# Patient Record
Sex: Male | Born: 1955 | Race: White | Hispanic: No | Marital: Married | Smoking: Never smoker
Health system: Southern US, Community
[De-identification: ages and names within clinical notes are randomized; demographics above are authoritative.]

## PROBLEM LIST (undated history)

## (undated) DIAGNOSIS — E669 Obesity, unspecified: Secondary | ICD-10-CM

## (undated) DIAGNOSIS — L03039 Cellulitis of unspecified toe: Secondary | ICD-10-CM

## (undated) DIAGNOSIS — M1990 Unspecified osteoarthritis, unspecified site: Secondary | ICD-10-CM

## (undated) DIAGNOSIS — D126 Benign neoplasm of colon, unspecified: Secondary | ICD-10-CM

## (undated) DIAGNOSIS — G4733 Obstructive sleep apnea (adult) (pediatric): Secondary | ICD-10-CM

## (undated) DIAGNOSIS — Z96652 Presence of left artificial knee joint: Secondary | ICD-10-CM

## (undated) DIAGNOSIS — E785 Hyperlipidemia, unspecified: Secondary | ICD-10-CM

## (undated) DIAGNOSIS — I10 Essential (primary) hypertension: Secondary | ICD-10-CM

## (undated) DIAGNOSIS — M2140 Flat foot [pes planus] (acquired), unspecified foot: Secondary | ICD-10-CM

## (undated) HISTORY — PX: OTHER SURGICAL HISTORY: SHX169

## (undated) HISTORY — DX: Obstructive sleep apnea (adult) (pediatric): G47.33

## (undated) HISTORY — DX: Flat foot (pes planus) (acquired), unspecified foot: M21.40

## (undated) HISTORY — DX: Unspecified osteoarthritis, unspecified site: M19.90

## (undated) HISTORY — DX: Hyperlipidemia, unspecified: E78.5

## (undated) HISTORY — DX: Obesity, unspecified: E66.9

## (undated) HISTORY — DX: Cellulitis of unspecified toe: L03.039

## (undated) HISTORY — DX: Benign neoplasm of colon, unspecified: D12.6

## (undated) HISTORY — DX: Essential (primary) hypertension: I10

---

## 2000-04-26 DIAGNOSIS — G4733 Obstructive sleep apnea (adult) (pediatric): Secondary | ICD-10-CM

## 2000-04-26 HISTORY — DX: Obstructive sleep apnea (adult) (pediatric): G47.33

## 2005-06-03 ENCOUNTER — Ambulatory Visit: Payer: Self-pay | Admitting: Podiatry

## 2007-07-18 ENCOUNTER — Encounter: Admission: RE | Admit: 2007-07-18 | Discharge: 2007-07-18 | Payer: Self-pay | Admitting: Orthopedic Surgery

## 2008-07-17 ENCOUNTER — Ambulatory Visit: Payer: Self-pay | Admitting: Unknown Physician Specialty

## 2011-05-17 ENCOUNTER — Ambulatory Visit (INDEPENDENT_AMBULATORY_CARE_PROVIDER_SITE_OTHER): Payer: BC Managed Care – PPO | Admitting: Internal Medicine

## 2011-05-17 ENCOUNTER — Encounter: Payer: Self-pay | Admitting: Internal Medicine

## 2011-05-17 DIAGNOSIS — E669 Obesity, unspecified: Secondary | ICD-10-CM | POA: Insufficient documentation

## 2011-05-17 DIAGNOSIS — M2140 Flat foot [pes planus] (acquired), unspecified foot: Secondary | ICD-10-CM | POA: Insufficient documentation

## 2011-05-17 DIAGNOSIS — G4733 Obstructive sleep apnea (adult) (pediatric): Secondary | ICD-10-CM | POA: Insufficient documentation

## 2011-05-17 DIAGNOSIS — I10 Essential (primary) hypertension: Secondary | ICD-10-CM | POA: Insufficient documentation

## 2011-05-17 NOTE — Assessment & Plan Note (Signed)
He has not had a repeat sleep study since he lost 70 pounds but is not interested in doing so at this time. He has not noticed any significant leak but has had to tighten the mask on his face. He continues to get a good night sleep when he uses the mask.

## 2011-05-17 NOTE — Assessment & Plan Note (Signed)
His orthopedic/podiatric problems are a barrier to him receiving regular exercise as is his work schedule. Spent several minutes today discussing the need for regular aerobic exercise to augment incredible job he is done with weight loss and dietary modification.

## 2011-05-17 NOTE — Assessment & Plan Note (Signed)
He has been taking Maxzide for years for management of hypertension but does not check his blood pressures regularly at home. His blood pressure today using a large cuff was 130/88. 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 Records from Dr. Reva Bores office have been requested and he has no history of renal insufficiency or diabetes.

## 2011-05-17 NOTE — Patient Instructions (Signed)
Try adding 500 mg of tylenol maximum 4 tablets daily to your diclofenac for pain relief.

## 2011-05-17 NOTE — Assessment & Plan Note (Addendum)
He has lost 70 pounds through dietary modifications use of the Mediterranean diet. I recommended that he add 20-30 minutes of aerobic exercise 5 days a week as a goal. This will be difficult to achieve given his works 12 hour days 6 days a week along with his orthopedic issues

## 2011-05-17 NOTE — Progress Notes (Signed)
Subjective:    Patient ID: John Wolf, male    DOB: 1955/07/24, 56 y.o.   MRN: [id number]  HPI John Wolf is a 56 year old white male with a history of obstructive sleep apnea , hypertension and obesity who presents to establish care. He was referred by Hyman Bower and is a former patient of Francia Greaves. He has achieved a weight loss of 70 pounds over the last 4-5 years with dietary modifications and wears his CPAP for sleep apnea every night with good results. 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 He does not get regular exercise but works 12 hour days at his business and is often too tired to do anything else. He does have a treadmill at home. Past Medical History  Diagnosis Date  . Arthritis   . Sleep apnea, obstructive 2002    wears CPAP  . Obesity (BMI 30-39.9)     with 70 lb weight loss, ongoing  . Acquired pes planus   . Hypertension    No current outpatient prescriptions on file prior to visit.    Review of Systems  Constitutional: Negative for fever, chills, diaphoresis, activity change, appetite change, fatigue and unexpected weight change.  HENT: Negative for hearing loss, ear pain, nosebleeds, congestion, sore throat, facial swelling, rhinorrhea, sneezing, drooling, mouth sores, trouble swallowing, neck pain, neck stiffness, dental problem, voice change, postnasal drip, sinus pressure, tinnitus and ear discharge.   Eyes: Negative for photophobia, pain, discharge, redness, itching and visual disturbance.  Respiratory: Negative for apnea, cough, choking, chest tightness, shortness of breath, wheezing and stridor.   Cardiovascular: Negative for chest pain, palpitations and leg swelling.  Gastrointestinal: Negative for nausea, vomiting, abdominal pain, diarrhea, constipation, blood in stool, abdominal distention, anal bleeding and rectal  pain.  Genitourinary: Negative for dysuria, urgency, frequency, hematuria, flank pain, decreased urine volume, scrotal swelling, difficulty urinating and testicular pain.  Musculoskeletal: Negative for myalgias, back pain, joint swelling, arthralgias and gait problem.  Skin: Negative for color change, rash and wound.  Neurological: Negative for dizziness, tremors, seizures, syncope, speech difficulty, weakness, light-headedness, numbness and headaches.  Psychiatric/Behavioral: Negative for suicidal ideas, hallucinations, behavioral problems, confusion, sleep disturbance, dysphoric mood, decreased concentration and agitation. The patient is not nervous/anxious.   BP 132/88  Pulse 63  Temp(Src) 98.2 F (36.8 C) (Oral)  Wt 291 lb (131.997 kg)  SpO2 95%     Objective:   Physical Exam  Constitutional: He is oriented to person, place, and time.  HENT:  Head: Normocephalic and atraumatic.  Mouth/Throat: Oropharynx is clear and moist.  Eyes: Conjunctivae and EOM are normal.  Neck: Normal range of motion. Neck supple. No JVD present. No thyromegaly present.  Cardiovascular: Normal rate, regular rhythm and normal heart sounds.   Pulmonary/Chest: Effort normal and breath sounds normal. He has no wheezes. He has no rales.  Abdominal: Soft. Bowel sounds are normal. He exhibits no mass. There is no tenderness. There is no rebound.  Musculoskeletal: Normal range of motion. He exhibits no edema.  Neurological: He is alert and oriented to person, place, and time.  Skin: Skin is warm and dry.  Psychiatric: He has a normal mood and affect.       Assessment & Plan:   Obesity (BMI 30-39.9) He has lost 70 pounds through dietary modifications use of the Mediterranean diet. I recommended that he add 20-30 minutes  of aerobic exercise 5 days a week as a goal. This will be difficult to achieve given his works 12 hour days 6 days a week along with his orthopedic issues   Hypertension He has been taking  Maxzide for years for management of hypertension but does not check his blood pressures regularly at home. His blood pressure today using a large cuff was 130/88. 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 Records from Dr. Reva Bores office have been requested and he has no history of renal insufficiency or diabetes.  Acquired pes planus His orthopedic/podiatric problems are a barrier to him receiving regular exercise as is his work schedule. Spent several minutes today discussing the need for regular aerobic exercise to augment incredible job he is done with weight loss and dietary modification.  Sleep apnea, obstructive He has not had a repeat sleep study since he lost 70 pounds but is not interested in doing so at this time. He has not noticed any significant leak but has had to tighten the mask on his face. He continues to get a good night sleep when he uses the mask.    Updated Medication List Outpatient Encounter Prescriptions as of 05/17/2011  Medication Sig Dispense Refill  . Omega-3 Fatty Acids (FISH OIL PO) Take by mouth.      . triamterene-hydrochlorothiazide (MAXZIDE-25) 37.5-25 MG per tablet Take 1 tablet by mouth daily.      Marland Kitchen DISCONTD: fluconazole (DIFLUCAN) 50 MG tablet Take 50 mg by mouth daily.

## 2011-06-24 ENCOUNTER — Other Ambulatory Visit: Payer: Self-pay | Admitting: Internal Medicine

## 2011-06-24 NOTE — Telephone Encounter (Signed)
Opened in error

## 2011-06-25 ENCOUNTER — Other Ambulatory Visit: Payer: Self-pay | Admitting: Internal Medicine

## 2011-06-25 MED ORDER — TRIAMTERENE-HCTZ 37.5-25 MG PO TABS: 1.0000 | ORAL_TABLET | Freq: Every day | ORAL | Status: DC

## 2011-11-19 ENCOUNTER — Encounter: Payer: Self-pay | Admitting: *Deleted

## 2012-05-19 ENCOUNTER — Telehealth: Payer: Self-pay | Admitting: Internal Medicine

## 2012-05-19 MED ORDER — DICLOFENAC SODIUM 75 MG PO TBEC: 75.0000 mg | DELAYED_RELEASE_TABLET | Freq: Two times a day (BID) | ORAL | Status: DC

## 2012-05-19 NOTE — Telephone Encounter (Signed)
Ok to refill,  Refill sent  

## 2012-05-19 NOTE — Telephone Encounter (Signed)
Pt scheduled for an appt on 2/3. Ok to fill?

## 2012-05-19 NOTE — Telephone Encounter (Signed)
Diclofenac sodium 75 mg  # 60  Take one tablet twice a day

## 2012-05-29 ENCOUNTER — Encounter: Payer: Self-pay | Admitting: Internal Medicine

## 2012-05-29 ENCOUNTER — Ambulatory Visit (INDEPENDENT_AMBULATORY_CARE_PROVIDER_SITE_OTHER): Payer: BC Managed Care – PPO | Admitting: Internal Medicine

## 2012-05-29 VITALS — BP 132/82 | HR 71 | Temp 98.5°F | Resp 16 | Ht 69.0 in | Wt 281.0 lb

## 2012-05-29 DIAGNOSIS — I10 Essential (primary) hypertension: Secondary | ICD-10-CM

## 2012-05-29 DIAGNOSIS — G4733 Obstructive sleep apnea (adult) (pediatric): Secondary | ICD-10-CM

## 2012-05-29 DIAGNOSIS — E785 Hyperlipidemia, unspecified: Secondary | ICD-10-CM

## 2012-05-29 DIAGNOSIS — E669 Obesity, unspecified: Secondary | ICD-10-CM

## 2012-05-29 DIAGNOSIS — Z79899 Other long term (current) drug therapy: Secondary | ICD-10-CM

## 2012-05-29 NOTE — Assessment & Plan Note (Signed)
Well controlled on current regimen. Renal function pending, no changes today. 

## 2012-05-29 NOTE — Progress Notes (Signed)
Patient ID: John Wolf, male   DO: Sep 30, 1955, 57 y.o.   MRM: [id number]  Patient Active Problem List  Diagnosis  . Hypertension  . Obesity (BMI 30-39.9)  . Sleep apnea, obstructive  . Acquired pes planus    Subjective:  CC:   Chief Complaint  Patient presents with  . Follow-up    med refills    HPI:   John McPhersonis a 57 y.o. male who presents for one year follow up on hypertension. No complaints today.  Feels generally well except for poor sleep due to frequent wakening's once for nocturia, the others wakening's for unclear  Reasons.  Wearing his CPAP and taking his medications as directed. Some bilateral hand pain centered around the thumb.  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    Past Medical History  Diagnosis Date  . Arthritis   . Sleep apnea, obstructive 2002    wears CPAP  . Obesity (BMI 30-39.9)     with 70 lb weight loss, ongoing  . Acquired pes planus   . Hypertension   . Hyperlipidemia   . Benign neoplasm of colon   . Onychia and paronychia of toe     History reviewed. No pertinent past surgical history.  The following portions of the patient's history were reviewed and updated as appropriate: Allergies, current medications, and problem list.    Review of Systems:  Patient denies headache, fevers, malaise, unintentional weight loss, skin rash, eye pain, sinus congestion and sinus pain, sore throat, dysphagia,  hemoptysis , cough, dyspnea, wheezing, chest pain, palpitations, orthopnea, edema, abdominal pain, nausea, melena, diarrhea, constipation, flank pain, dysuria, hematuria, urinary  Frequency, nocturia, numbness, tingling, seizures,  Focal weakness, Loss of consciousness,  Tremor, insomnia, depression, anxiety, and suicidal ideation.     History   Social History  . Marital Status: Married    Spouse Name: N/A    Number of Children: N/A  . Years of Education: N/A   Occupational History  . Not on file.   Social  History Main Topics  . Smoking status: Never Smoker   . Smokeless tobacco: Never Used  . Alcohol Use: Yes  . Drug Use:   . Sexually Active:    Other Topics Concern  . Not on file   Social History Narrative  . No narrative on file    Objective:  BP 132/82  Pulse 71  Temp 98.5 F (36.9 C) (Oral)  Resp 16  Ht 5\' 9"  (1.753 m)  Wt 281 lb (127.461 kg)  BMI 41.50 kg/m2  SpO2 96%  General appearance: alert, cooperative and appears stated age Ears: normal TM's and external ear canals both ears Throat: lips, mucosa, and tongue normal; teeth and gums normal Neck: no adenopathy, no carotid bruit, supple, symmetrical, trachea midline and thyroid not enlarged, symmetric, no tenderness/mass/nodules Back: symmetric, no curvature. ROM normal. No CVA tenderness. Lungs: clear to auscultation bilaterally Heart: regular rate and rhythm, S1, S2 normal, no murmur, click, rub or gallop Abdomen: soft, non-tender; bowel sounds normal; no masses,  no organomegaly Pulses: 2+ and symmetric Skin: Skin color, texture, turgor normal. No rashes or lesions Lymph nodes: Cervical, supraclavicular, and axillary nodes normal. MSK:  Hand grip 5/5 ,  Interosseus muscles and thenar muscles normal  Assessment and Plan:  Hypertension Well controlled on current regimen. Renal function pending, no changes today.  Obesity (BMI 30-39.9) He has lost another 10 lbs. Encouragement provided  Sleep apnea, obstructive Diagnosed nearly 10 years ago  , with  regular use na no follow up study despite wt loss of 80 lbs and 10 yrs time.  Not sleeping well with frequently awakenings,  Refuses repeat titration study despite recommendations.    Updated Medication List Outpatient Encounter Prescriptions as of 05/29/2012  Medication Sig Dispense Refill  . diclofenac (VOLTAREN) 75 MG EC tablet Take 1 tablet (75 mg total) by mouth 2 (two) times daily. As needed for muscle or joint pain  60 tablet  0  . Omega-3 Fatty Acids (FISH  OIL PO) Take by mouth.      . triamterene-hydrochlorothiazide (MAXZIDE-25) 37.5-25 MG per tablet Take 1 each (1 tablet total) by mouth daily.  90 tablet  6     Orders Placed This Encounter  Procedures  . Comprehensive metabolic panel  . LDL cholesterol, direct  . HM COLONOSCOPY    No Follow-up on file.

## 2012-05-29 NOTE — Assessment & Plan Note (Signed)
He has lost another 10 lbs. Encouragement provided

## 2012-05-29 NOTE — Patient Instructions (Addendum)
You can use 4 advil every 8 hours if needed but do ot combine with diclofenac (too hard on stomach and kidneys) Ok to add tylenol up to 4 daily (max 2000 mg )  to either advil or diclofenac   Limit liquor or wine to 2  Drinks per night   Checking liver and kidney function today    consider taking prilosec, prevacid or pepcid daily to prevent ulcers if using motrin daily   We will schedule you for a male physical in the summer   You are overdue for a repeat sleep study!!!!

## 2012-05-29 NOTE — Assessment & Plan Note (Signed)
Diagnosed nearly 10 years ago  , with regular use na no follow up study despite wt loss of 80 lbs and 10 yrs time.  Not sleeping well with frequently awakenings,  Refuses repeat titration study despite recommendations.

## 2012-05-30 ENCOUNTER — Encounter: Payer: Self-pay | Admitting: *Deleted

## 2012-05-30 LAB — COMPREHENSIVE METABOLIC PANEL
AST: 22 U/L (ref 0–37)
Albumin: 3.9 g/dL (ref 3.5–5.2)
Alkaline Phosphatase: 48 U/L (ref 39–117)
Potassium: 4.3 mEq/L (ref 3.5–5.1)
Sodium: 141 mEq/L (ref 135–145)
Total Bilirubin: 0.6 mg/dL (ref 0.3–1.2)
Total Protein: 6.6 g/dL (ref 6.0–8.3)

## 2012-05-30 LAB — LDL CHOLESTEROL, DIRECT: Direct LDL: 73.9 mg/dL

## 2012-06-27 ENCOUNTER — Other Ambulatory Visit: Payer: Self-pay | Admitting: *Deleted

## 2012-06-28 ENCOUNTER — Other Ambulatory Visit: Payer: Self-pay | Admitting: *Deleted

## 2012-06-30 MED ORDER — DICLOFENAC SODIUM 75 MG PO TBEC: 75.0000 mg | DELAYED_RELEASE_TABLET | Freq: Two times a day (BID) | ORAL | Status: DC

## 2012-08-25 ENCOUNTER — Other Ambulatory Visit: Payer: Self-pay | Admitting: *Deleted

## 2012-08-25 MED ORDER — TRIAMTERENE-HCTZ 37.5-25 MG PO TABS: 1.0000 | ORAL_TABLET | Freq: Every day | ORAL | Status: DC

## 2012-08-25 NOTE — Telephone Encounter (Signed)
Rx sent to pharmacy by escript  

## 2013-01-12 ENCOUNTER — Encounter: Payer: Self-pay | Admitting: Internal Medicine

## 2013-01-30 ENCOUNTER — Other Ambulatory Visit: Payer: Self-pay | Admitting: Internal Medicine

## 2013-01-31 ENCOUNTER — Other Ambulatory Visit: Payer: Self-pay | Admitting: *Deleted

## 2013-01-31 NOTE — Telephone Encounter (Signed)
Last visit 2/14, Refill?

## 2013-02-01 ENCOUNTER — Other Ambulatory Visit: Payer: Self-pay | Admitting: *Deleted

## 2013-02-22 ENCOUNTER — Other Ambulatory Visit: Payer: Self-pay | Admitting: Internal Medicine

## 2013-03-01 ENCOUNTER — Other Ambulatory Visit: Payer: Self-pay

## 2013-05-15 ENCOUNTER — Other Ambulatory Visit: Payer: Self-pay | Admitting: Internal Medicine

## 2013-05-15 NOTE — Telephone Encounter (Signed)
Last visit and labs 05/29/12, refill?

## 2013-05-17 ENCOUNTER — Other Ambulatory Visit: Payer: Self-pay | Admitting: *Deleted

## 2013-05-18 ENCOUNTER — Other Ambulatory Visit: Payer: Self-pay | Admitting: *Deleted

## 2013-05-22 ENCOUNTER — Other Ambulatory Visit: Payer: Self-pay | Admitting: *Deleted

## 2013-05-23 ENCOUNTER — Other Ambulatory Visit: Payer: Self-pay | Admitting: *Deleted

## 2013-08-03 ENCOUNTER — Other Ambulatory Visit: Payer: Self-pay | Admitting: Internal Medicine

## 2013-08-03 NOTE — Telephone Encounter (Signed)
Ok to fill 

## 2013-10-08 ENCOUNTER — Other Ambulatory Visit: Payer: Self-pay | Admitting: Internal Medicine

## 2013-10-08 NOTE — Telephone Encounter (Signed)
pts last OV 2.3.2014 last refill 1.5.2015.  Refill?

## 2014-06-06 ENCOUNTER — Other Ambulatory Visit: Payer: Self-pay | Admitting: Internal Medicine

## 2014-07-12 ENCOUNTER — Ambulatory Visit: Payer: Self-pay | Admitting: Internal Medicine

## 2014-07-15 ENCOUNTER — Other Ambulatory Visit: Payer: Self-pay | Admitting: Internal Medicine

## 2014-07-15 DIAGNOSIS — R5383 Other fatigue: Secondary | ICD-10-CM

## 2014-07-15 DIAGNOSIS — I10 Essential (primary) hypertension: Secondary | ICD-10-CM

## 2014-07-15 DIAGNOSIS — E785 Hyperlipidemia, unspecified: Secondary | ICD-10-CM

## 2014-07-15 DIAGNOSIS — Z1159 Encounter for screening for other viral diseases: Secondary | ICD-10-CM

## 2014-07-15 DIAGNOSIS — Z125 Encounter for screening for malignant neoplasm of prostate: Secondary | ICD-10-CM

## 2014-07-16 ENCOUNTER — Ambulatory Visit (INDEPENDENT_AMBULATORY_CARE_PROVIDER_SITE_OTHER): Payer: BLUE CROSS/BLUE SHIELD | Admitting: Internal Medicine

## 2014-07-16 ENCOUNTER — Encounter: Payer: Self-pay | Admitting: Internal Medicine

## 2014-07-16 VITALS — BP 134/90 | HR 69 | Temp 98.6°F | Resp 14 | Ht 69.0 in | Wt 304.5 lb

## 2014-07-16 DIAGNOSIS — E785 Hyperlipidemia, unspecified: Secondary | ICD-10-CM

## 2014-07-16 DIAGNOSIS — I10 Essential (primary) hypertension: Secondary | ICD-10-CM

## 2014-07-16 DIAGNOSIS — Z125 Encounter for screening for malignant neoplasm of prostate: Secondary | ICD-10-CM | POA: Diagnosis not present

## 2014-07-16 DIAGNOSIS — R5383 Other fatigue: Secondary | ICD-10-CM

## 2014-07-16 DIAGNOSIS — E559 Vitamin D deficiency, unspecified: Secondary | ICD-10-CM

## 2014-07-16 DIAGNOSIS — Z0000 Encounter for general adult medical examination without abnormal findings: Secondary | ICD-10-CM | POA: Diagnosis not present

## 2014-07-16 DIAGNOSIS — M2142 Flat foot [pes planus] (acquired), left foot: Secondary | ICD-10-CM

## 2014-07-16 DIAGNOSIS — Z23 Encounter for immunization: Secondary | ICD-10-CM | POA: Diagnosis not present

## 2014-07-16 DIAGNOSIS — M2141 Flat foot [pes planus] (acquired), right foot: Secondary | ICD-10-CM | POA: Diagnosis not present

## 2014-07-16 DIAGNOSIS — E669 Obesity, unspecified: Secondary | ICD-10-CM | POA: Diagnosis not present

## 2014-07-16 DIAGNOSIS — Z1159 Encounter for screening for other viral diseases: Secondary | ICD-10-CM

## 2014-07-16 MED ORDER — DICLOFENAC SODIUM ER 100 MG PO TB24: 100.0000 mg | ORAL_TABLET | Freq: Every day | ORAL | Status: DC

## 2014-07-16 MED ORDER — DICLOFENAC SODIUM 75 MG PO TBEC: DELAYED_RELEASE_TABLET | ORAL | Status: DC

## 2014-07-16 MED ORDER — TRIAMTERENE-HCTZ 37.5-25 MG PO TABS: ORAL_TABLET | ORAL | Status: DC

## 2014-07-16 NOTE — Patient Instructions (Addendum)
Repeat sleep study to boe ordered at [facility]  Return for fasting labs  ASAP  second opinion on ankle/foot by [facility] loss of 30 lb over the next 6 months   This is Dr. Lupita Dawn version of a  "Low GI"  Weight loss Diet.  It is appropriate for all patients with normal renal function , gluten tolerance, and advised for patients who have prediabetes or diabetes:   All of the foods can be found at grocery stores and in bulk at [employer].  The Atkins protein bars and shakes are available in more varieties at Target, WalMart and [place].     7 AM Breakfast:  Choose from the following:  < 5 carbs  Weekdays: Low carbohydrate Protein  Shakes (EAS AdvantEdge "Carb  Control" shakes, Atkins,  Muscle Milk or Premier Protein shakes)     Weekends:  a scrambled egg/bacon/cheese burrito made with Mission's "carb  balance" whole wheat tortilla  (about 10 net carbs )  Eggs,  bacon /sausage , Joseph's pita /lavash bread or  (5 carbs)  A slice of fritatta ( egg based baked dish, no  crust:  google it) (< 10 carbs)   Avoid cereal and bananas, oatmeal and cream of wheat and grits. They are loaded with carbohydrates!   10 AM: high protein snack  (< 5 carbs)   Protein bar by Atkins  Or KIND  (the snack size, < 200 cal, usually < 6 carbs    A stick of cheese:  Around 1 carb,  100 cal      Other so called "protein bars" tend to be loaded with carbohydrates.  Remember, in food advertising, the word "energy" is synonymous for " carbohydrate."  Lunch:   A Sandwich using the bread choices listed, Can use any  Eggs,  lunchmeat, grilled meat or canned tuna).  Can add avocado, regular mayo/mustard  and cheese.  A Salad using blue cheese, ranch,  Goddess dressing  or vinagrette,  No croutons or "confetti" and no "candied nuts" but regular nuts OK.   2 HARD BOILED EGG WHITES AND A CUP OF one of these greek yogurts:    dannon lt n fit greek yogurt         chobani 100 greek yogurt,    Oikos triple zero  greek yogurt       No pretzels or chips.  Pickles and miniature sweet peppers are a good low  carb alternative that provide a "crunch"  The bread is the only source of carbohydrate in a sandwich and  can be decreased by trying some of these alternatives to traditional loaf bread:   Joseph's pita bread and Lavash (flat) bread :  50 cal and 4 net carbs  available at [employer]Js and Wal[employer].  Taste better when toasted, use as pita chips  [employer] makes a variety of  flatbreads and  A PITA POCKET.    LOOK FOR  THE ONES THAT ARE 17 NET CARBS OR LESS    Mission makes 2 sizes of  Low carb whole wheat tortillas  (The large one is  210 cal and 6 net carbs)   Avoid "Low fat dressings, as well as Barry Brunner and [place] dressings    3 PM/ Mid day  Snack:  Consider  1 ounce of  almonds, walnuts, pistachios, pecans, peanuts,  Macadamia nuts or a nut medley that does not contain raisins or cranberries.  No "granola"; the dried cranberries and raisins are  loaded with carbohydrates. Mixed nuts as long as there are no raisins,  cranberries or dried fruit.    Try the prosciutto/mozzarella cheese sticks by [employer]  In deli /backery section   High protein   To avoid overindulging in snacks: Try drinking a glass of unsweeted almond/coconut milk  Or a cup of coffee with your Atkins chocolate bar to keep you from having 3!!!   Pork rinds!  Yes Pork Rinds are low carb potato chip substitute!   Toasted Joseph's flatbread with hummous dip (chickpeas)       6 PM  Dinner:     Meat/fowl/fish with a green salad, and either broccoli, cauliflower, green beans, spinach, brussel sprouts, bok choy or  Lima beans. Fried in canola oil /olive oil BUT DO NOT BREAD THE PROTEIN!!      There is a low carb pasta by Dreamfield's that is acceptable and tastes great: only 5 digestible carbs/serving.( All grocery stores but BJs carry it )  Prepared Meals:  Try Hurley Cisco Angelo's chicken piccata or chicken or eggplant parm over low carb  pasta.(Lowes and BJs)   Marjory Lies Sanchez's "Carnitas" (pulled pork, no sauce,  0 carbs) or his beef pot roast to make a dinner burrito (at [employer])  Barbecue with cole slaw is low carb BUT NO BUN!  SAME WITH HAMBURGERS     Whole wheat pasta is still full of digestible carbs and  Not as low in glycemic index as Dreamfield's.   Brown rice is still rice,  So skip the rice and noodles if you eat [place] or [place] (or at least limit to 1/2 cup)  9 PM snack :   Breyer's "low carb" fudgsicle or  ice cream bar (Carb Smart line), or  Weight  Watcher's ice cream bar , or another "no sugar added" ice cream;  a serving of fresh berries/cherries with whipped cream   Cheese or greek yogurt   8 ounces of [place] unsweetened almond/cococunut milk  Cheese and crackers (using WASA crackers,  They are low carb) or peanut butter on low carb crackers or pita bread     Avoid bananas, pineapple, grapes  and watermelon on a regular basis because they are high in sugar.  THINK OF THEM AS DESSERT and do not have daily   Remember that snack Substitutions should be less than 10 NET carbs per serving and meals should be < 20 net carbs. Remember that carbohydrates from fiber do not affect blood sugar, so you can  subtract fiber grams to get the "net carbs " of any particular food item.

## 2014-07-16 NOTE — Progress Notes (Signed)
Pre-visit discussion using our clinic review tool. No additional management support is needed unless otherwise documented below in the visit note.  

## 2014-07-16 NOTE — Progress Notes (Signed)
Patient ID: John Wolf, male   DOB: Aug 05, 1955, 59 y.o.   MRN: [id number]  The patient is here for his annual male physical examination and management of other chronic and acute problems.  Annual exam   Has OSA not using CPAP regularly   Last one 10 yrears ago,  At [facility]  Severe ankle pain secondary to pes planus,  Surgical intervention offered by [facility] but  deferred     The risk factors are reflected in the social history.  The roster of all physicians providing medical care to patient - is listed in the Snapshot section of the chart.  Activities of daily living:  The patient is 100% independent in all ADLs: dressing, toileting, feeding as well as independent mobility  Home safety : The patient has smoke detectors in the home. He wears seatbelts.  There are no firearms at home. There is no violence in the home.   There is no risks for hepatitis, STDs or HIV. There is no   history of blood transfusion. There is no travel history to infectious disease endemic areas of the world.  The patient has seen their dentist in the last six month and  their eye doctor in the last year.  They do not  have excessive sun exposure. They have seen a dermatoloigist in the last year. Discussed the need for sun protection: hats, long sleeves and use of sunscreen if there is significant sun exposure.   Diet: the importance of a healthy diet is discussed. They do have a healthy diet.  The benefits of regular aerobic exercise were discussed. He exercises a minimum of 30 minutes  5 days per week. Depression screen: there are no signs or vegative symptoms of depression- irritability, change in appetite, anhedonia, sadness/tearfullness.  The following portions of the patient's history were reviewed and updated as appropriate: allergies, current medications, past family history, past medical history,  past surgical history, past social history  and problem list.  Visual acuity was not assessed per  patient preference since he has regular follow up with his ophthalmologist. Hearing and body mass index were assessed and reviewed.   During the course of the visit the patient was educated and counseled about appropriate screening and preventive services including :  nutrition counseling, colorectal cancer screening, and recommended immunizations.    Review of Systems:  Patient denies headache, fevers, malaise, unintentional weight loss, skin rash, eye pain, sinus congestion and sinus pain, sore throat, dysphagia,  hemoptysis , cough, dyspnea, wheezing, chest pain, palpitations, orthopnea, edema, abdominal pain, nausea, melena, diarrhea, constipation, flank pain, dysuria, hematuria, urinary  Frequency, nocturia, numbness, tingling, seizures,  Focal weakness, Loss of consciousness,  Tremor, insomnia, depression, anxiety, and suicidal ideation.   Objective:  BP 134/90 mmHg  Pulse 69  Temp(Src) 98.6 F (37 C) (Oral)  Resp 14  Ht 5\' 9"  (1.753 m)  Wt 304 lb 8 oz (138.12 kg)  BMI 44.95 kg/m2  SpO2 94%    General appearance: alert, obese, cooperative and appears stated age Ears: normal TM's and external ear canals both ears Throat: lips, mucosa, and tongue normal; teeth and gums normal Neck: no adenopathy, no carotid bruit, supple, symmetrical, trachea midline and thyroid not enlarged, symmetric, no tenderness/mass/nodules Back: symmetric, no curvature. ROM normal. No CVA tenderness. Lungs: clear to auscultation bilaterally Heart: regular rate and rhythm, S1, S2 normal, no murmur, click, rub or gallop Abdomen: soft, non-tender; bowel sounds normal; no masses,  no organomegaly Pulses: 2+ and symmetric Skin:  Skin color, texture, turgor normal. No rashes or lesions Lymph nodes: Cervical, supraclavicular, and axillary nodes normal. MSK: severe bilateral pes planus   Assessment and Plan:     Problem List Items Addressed This Visit      Unprioritized   Visit for preventive health  examination    Annual wellness  exam was done as well as a comprehensive physical exam and management of acute and chronic conditions .  During the course of the visit the patient was educated and counseled about appropriate screening and preventive services including :  diabetes screening, lipid analysis with projected  10 year  risk for CAD , nutrition counseling, colorectal cancer screening, and recommended immunizations.  Printed recommendations for health maintenance screenings was given.        Obesity (BMI 30-39.9)    I have addressed  BMI and recommended wt loss of 10% of body weigh over the next 6 months using a low glycemic index diet and regular exercise a minimum of 5 days per week.        Hypertension    Well controlled on current regimen. Renal function is overdue ,  no changes today.  Lab Results  Component Value Date   CREATININE 0.6 05/29/2012   Lab Results  Component Value Date   NA 141 05/29/2012   K 4.3 05/29/2012   CL 106 05/29/2012   CO2 29 05/29/2012         Relevant Medications   triamterene-hydrochlorothiazide (MAXZIDE-25) 37.5-25 MG per tablet   Acquired pes planus - Primary    Severe,  With local podiatry offering surgery.  Referral to [facility] for second opinion      Relevant Orders   Ambulatory referral to Ophthalmology   Ambulatory referral to Orthopedic Surgery    Other Visit Diagnoses    Encounter for immunization        Need for prophylactic vaccination with combined diphtheria-tetanus-pertussis (DTP) vaccine        Relevant Orders    Tdap vaccine greater than or equal to 7yo IM (Completed)    Need for hepatitis C screening test        Hyperlipidemia        Relevant Medications    triamterene-hydrochlorothiazide (MAXZIDE-25) 37.5-25 MG per tablet    Vitamin D deficiency        Other fatigue        Screening for prostate cancer

## 2014-07-16 NOTE — Assessment & Plan Note (Addendum)
Severe,  With local podiatry offering surgery.  Referral to [facility] for second opinion

## 2014-07-17 ENCOUNTER — Encounter: Payer: Self-pay | Admitting: Internal Medicine

## 2014-07-17 DIAGNOSIS — Z0000 Encounter for general adult medical examination without abnormal findings: Secondary | ICD-10-CM | POA: Insufficient documentation

## 2014-07-17 NOTE — Assessment & Plan Note (Signed)
Well controlled on current regimen. Renal function is overdue ,  no changes today.  Lab Results  Component Value Date   CREATININE 0.6 05/29/2012   Lab Results  Component Value Date   NA 141 05/29/2012   K 4.3 05/29/2012   CL 106 05/29/2012   CO2 29 05/29/2012

## 2014-07-17 NOTE — Assessment & Plan Note (Signed)
I have addressed  BMI and recommended wt loss of 10% of body weigh over the next 6 months using a low glycemic index diet and regular exercise a minimum of 5 days per week.   

## 2014-07-17 NOTE — Assessment & Plan Note (Signed)

## 2014-07-18 ENCOUNTER — Other Ambulatory Visit (INDEPENDENT_AMBULATORY_CARE_PROVIDER_SITE_OTHER): Payer: BLUE CROSS/BLUE SHIELD

## 2014-07-18 DIAGNOSIS — R5383 Other fatigue: Secondary | ICD-10-CM

## 2014-07-18 DIAGNOSIS — Z125 Encounter for screening for malignant neoplasm of prostate: Secondary | ICD-10-CM | POA: Diagnosis not present

## 2014-07-18 DIAGNOSIS — Z1159 Encounter for screening for other viral diseases: Secondary | ICD-10-CM

## 2014-07-18 DIAGNOSIS — E785 Hyperlipidemia, unspecified: Secondary | ICD-10-CM

## 2014-07-18 DIAGNOSIS — I10 Essential (primary) hypertension: Secondary | ICD-10-CM

## 2014-07-18 DIAGNOSIS — E875 Hyperkalemia: Secondary | ICD-10-CM

## 2014-07-18 LAB — CBC WITH DIFFERENTIAL/PLATELET
BASOS ABS: 0 10*3/uL (ref 0.0–0.1)
BASOS PCT: 0.7 % (ref 0.0–3.0)
EOS ABS: 0.1 10*3/uL (ref 0.0–0.7)
Eosinophils Relative: 2.3 % (ref 0.0–5.0)
HCT: 43 % (ref 39.0–52.0)
Hemoglobin: 15 g/dL (ref 13.0–17.0)
LYMPHS PCT: 16.9 % (ref 12.0–46.0)
Lymphs Abs: 0.8 10*3/uL (ref 0.7–4.0)
MCHC: 34.9 g/dL (ref 30.0–36.0)
MCV: 89.2 fl (ref 78.0–100.0)
MONO ABS: 0.5 10*3/uL (ref 0.1–1.0)
Monocytes Relative: 11 % (ref 3.0–12.0)
NEUTROS PCT: 69.1 % (ref 43.0–77.0)
Neutro Abs: 3.3 10*3/uL (ref 1.4–7.7)
PLATELETS: 215 10*3/uL (ref 150.0–400.0)
RBC: 4.82 Mil/uL (ref 4.22–5.81)
RDW: 13.3 % (ref 11.5–15.5)
WBC: 4.8 10*3/uL (ref 4.0–10.5)

## 2014-07-18 LAB — COMPREHENSIVE METABOLIC PANEL
ALBUMIN: 4.2 g/dL (ref 3.5–5.2)
ALK PHOS: 59 U/L (ref 39–117)
ALT: 31 U/L (ref 0–53)
AST: 22 U/L (ref 0–37)
BILIRUBIN TOTAL: 0.8 mg/dL (ref 0.2–1.2)
BUN: 17 mg/dL (ref 6–23)
CO2: 33 mEq/L — ABNORMAL HIGH (ref 19–32)
Calcium: 9.7 mg/dL (ref 8.4–10.5)
Chloride: 102 mEq/L (ref 96–112)
Creatinine, Ser: 0.66 mg/dL (ref 0.40–1.50)
GFR: 132.47 mL/min (ref >60.00)
Glucose, Bld: 94 mg/dL (ref 70–99)
POTASSIUM: 5.2 meq/L — AB (ref 3.5–5.1)
SODIUM: 139 meq/L (ref 135–145)
Total Protein: 6.8 g/dL (ref 6.0–8.3)

## 2014-07-18 LAB — LIPID PANEL
Cholesterol: 198 mg/dL (ref 0–200)
HDL: 52.7 mg/dL (ref >39.00)
LDL Cholesterol: 121 mg/dL — ABNORMAL HIGH (ref 0–99)
NONHDL: 145.3
Total CHOL/HDL Ratio: 4
Triglycerides: 123 mg/dL (ref 0.0–149.0)
VLDL: 24.6 mg/dL (ref 0.0–40.0)

## 2014-07-18 LAB — TSH: TSH: 0.71 u[IU]/mL (ref 0.35–4.50)

## 2014-07-18 LAB — PSA: PSA: 0.82 ng/mL (ref 0.10–4.00)

## 2014-07-19 LAB — HEPATITIS C ANTIBODY: HCV AB: NEGATIVE

## 2014-07-22 ENCOUNTER — Encounter: Payer: Self-pay | Admitting: Internal Medicine

## 2014-07-22 NOTE — Addendum Note (Signed)
Addended by: Crecencio Mc on: 07/22/2014 06:16 AM   Modules accepted: Orders

## 2014-07-30 ENCOUNTER — Telehealth: Payer: Self-pay | Admitting: Internal Medicine

## 2014-07-30 DIAGNOSIS — M2141 Flat foot [pes planus] (acquired), right foot: Secondary | ICD-10-CM

## 2014-07-30 DIAGNOSIS — M2142 Flat foot [pes planus] (acquired), left foot: Principal | ICD-10-CM

## 2014-07-30 NOTE — Telephone Encounter (Signed)
Patient DPR John Wolf) called and stated insurance would not cover the podiatrist that patient was referred to we will have to try [facility] or someone in [place] please advise also left message for patient to call back concerning unread My Chart Message. Please advise.

## 2014-08-01 ENCOUNTER — Other Ambulatory Visit (INDEPENDENT_AMBULATORY_CARE_PROVIDER_SITE_OTHER): Payer: BLUE CROSS/BLUE SHIELD

## 2014-08-01 DIAGNOSIS — E875 Hyperkalemia: Secondary | ICD-10-CM

## 2014-08-01 LAB — BASIC METABOLIC PANEL
BUN: 20 mg/dL (ref 6–23)
CHLORIDE: 105 meq/L (ref 96–112)
CO2: 26 mEq/L (ref 19–32)
CREATININE: 0.64 mg/dL (ref 0.40–1.50)
Calcium: 9.4 mg/dL (ref 8.4–10.5)
GFR: 137.24 mL/min (ref >60.00)
GLUCOSE: 90 mg/dL (ref 70–99)
Potassium: 4 mEq/L (ref 3.5–5.1)
Sodium: 140 mEq/L (ref 135–145)

## 2014-08-02 ENCOUNTER — Encounter: Payer: Self-pay | Admitting: Internal Medicine

## 2014-08-05 ENCOUNTER — Telehealth: Payer: Self-pay | Admitting: Internal Medicine

## 2014-10-04 DIAGNOSIS — M216X1 Other acquired deformities of right foot: Secondary | ICD-10-CM | POA: Insufficient documentation

## 2014-10-04 DIAGNOSIS — M19071 Primary osteoarthritis, right ankle and foot: Secondary | ICD-10-CM | POA: Insufficient documentation

## 2014-10-16 ENCOUNTER — Encounter: Payer: Self-pay | Admitting: Internal Medicine

## 2014-10-16 ENCOUNTER — Ambulatory Visit (INDEPENDENT_AMBULATORY_CARE_PROVIDER_SITE_OTHER): Payer: BLUE CROSS/BLUE SHIELD | Admitting: Internal Medicine

## 2014-10-16 VITALS — BP 128/80 | HR 94 | Temp 98.1°F | Resp 14 | Ht 69.0 in | Wt 304.2 lb

## 2014-10-16 DIAGNOSIS — I10 Essential (primary) hypertension: Secondary | ICD-10-CM

## 2014-10-16 DIAGNOSIS — E669 Obesity, unspecified: Secondary | ICD-10-CM | POA: Diagnosis not present

## 2014-10-16 DIAGNOSIS — G4733 Obstructive sleep apnea (adult) (pediatric): Secondary | ICD-10-CM

## 2014-10-16 NOTE — Progress Notes (Signed)
Subjective:  Patient ID: John Wolf, male    DOB: 06/19/1955  Age: 59 y.o. MRN: [id number]  CC: The primary encounter diagnosis was OSA (obstructive sleep apnea). Diagnoses of Sleep apnea, obstructive, Essential hypertension, and Obesity (BMI 30-39.9) were also pertinent to this visit.  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  Since his last visit he has han an evaluation by [facility] orthopedics for evauation nof ankle.  He was offered triple arthrodesis but declined due to work schedule.  He was advised to lose weight, but his weight has not changed.  He is not motivated to lose weight or exercise.  Diet and exercise options were discussed at length today.  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    `  Outpatient Prescriptions Prior to Visit  Medication Sig Dispense Refill  . Diclofenac Sodium CR 100 MG 24 hr tablet Take 1 tablet (100 mg total) by mouth daily. 90 tablet 1  . Omega-3 Fatty Acids (FISH OIL PO) Take by mouth.    . triamterene-hydrochlorothiazide (MAXZIDE-25) 37.5-25 MG per tablet TAKE 1 TABLET EVERY DAY USUALLY IN THE MORNING 90 tablet 1   No facility-administered medications prior to visit.    Review of Systems;  Patient denies headache, fevers, malaise, unintentional weight loss, skin rash, eye pain, sinus congestion and sinus pain, sore throat, dysphagia,  hemoptysis , cough, dyspnea, wheezing, chest pain, palpitations, orthopnea, edema, abdominal pain, nausea, melena, diarrhea, constipation, flank pain, dysuria, hematuria, urinary  Frequency, nocturia, numbness, tingling, seizures,  Focal weakness, Loss of consciousness,  Tremor, insomnia, depression, anxiety, and suicidal ideation.      Objective:  BP 128/80 mmHg  Pulse 94  Temp(Src) 98.1 F (36.7 C) (Oral)  Resp 14  Ht 5\' 9"  (1.753 m)  Wt 304 lb 4 oz (138.007  kg)  BMI 44.91 kg/m2  SpO2 94%  BP Readings from Last 3 Encounters:  10/16/14 128/80  07/16/14 134/90  05/29/12 132/82    Wt Readings from Last 3 Encounters:  10/16/14 304 lb 4 oz (138.007 kg)  07/16/14 304 lb 8 oz (138.12 kg)  05/29/12 281 lb (127.461 kg)    General appearance: alert, cooperative and appears stated age Neck: no adenopathy, no carotid bruit, supple, symmetrical, trachea midline and thyroid not enlarged, symmetric, no tenderness/mass/nodules Back: symmetric, no curvature. ROM normal. No CVA tenderness. Lungs: clear to auscultation bilaterally Heart: regular rate and rhythm, S1, S2 normal, no murmur, click, rub or gallop Abdomen: soft, non-tender; bowel sounds normal; no masses,  no organomegaly Pulses: 2+ and symmetric Skin: Skin color, texture, turgor normal. No rashes or lesions   No results found for: HGBA1C  Lab Results  Component Value Date   CREATININE 0.64 08/01/2014   CREATININE 0.66 07/18/2014   CREATININE 0.6 05/29/2012    Lab Results  Component Value Date   WBC 4.8 07/18/2014   HGB 15.0 07/18/2014   HCT 43.0 07/18/2014   PLT 215.0 07/18/2014   GLUCOSE 90 08/01/2014   CHOL 198 07/18/2014   TRIG 123.0 07/18/2014   HDL 52.70 07/18/2014   LDLDIRECT 73.9 05/29/2012   LDLCALC 121* 07/18/2014   ALT 31 07/18/2014   AST 22 07/18/2014   NA 140 08/01/2014   K 4.0 08/01/2014   CL 105 08/01/2014   CREATININE 0.64 08/01/2014   BUN 20 08/01/2014   CO2 26 08/01/2014  TSH 0.71 07/18/2014   PSA 0.82 07/18/2014    No results found.  Assessment & Plan:   Problem List Items Addressed This Visit    Hypertension    Well controlled on current regimen. Renal function stable, no changes today.      Obesity (BMI 30-39.9)    I have addressed  BMI and recommended wt loss of 12 lbs  over the next 3 months using a low glycemic index diet and regular exercise a minimum of 5 days per week.        Sleep apnea, obstructive    .diagnosed with prior  sleep study over 5 years ago  but treatment has been deferred d by patient.  Discussed the long term history of OSA , the risks of long term damage to heart and the signs and symptoms attributable to OSA.  Advised patient to consider  significant weight loss and/or use of CPAP.       RESOLVED: OSA (obstructive sleep apnea) - Primary     A total of 40 minutes was spent with patient more than half of which was spent in counseling patient on the above mentioned issues , reviewing and explaining recent labs and imaging studies done, and coordination of care. I am having Mr. Cupples maintain his Omega-3 Fatty Acids (FISH OIL PO), triamterene-hydrochlorothiazide, and Diclofenac Sodium CR.  No orders of the defined types were placed in this encounter.    There are no discontinued medications.  Follow-up: Return in about 3 months (around 01/16/2015).   Crecencio Mc, MD

## 2014-10-16 NOTE — Patient Instructions (Signed)
I want you to WANT to lose weight this year.  Pick a diet, one that you can stay on as a way of life  If you want to try the smoothies,  JJ Smith 10 day green smoothie detox diet is available on [employer]   . This is  my version of a  "Low GI"  Weight loss Diet:  It has enabled many of my patients to lose up to 10 lbs per month depending on how much you restrict your carbs.   follow it carefully.  The idea behind low carb diets is that your body has to take the fat and protein in your food and turn it into an energy that is less efficient than carbohydrates, so you lose weight.    I have listed several choices at each of your 6 meal times to make it easy to shop.and plan    Remember that snack Substitutions should be equal to or less than 5 NET carbs per serving and  The only carbs in meals come from the pasta or vegetables so, they should be < 15 net carbs. Remember that carbohydrates from fiber do not count, so you can  subtract fiber grams to get the "net carbs " of any particular food item.    All of the foods can be found at grocery stores and in bulk at [employer].  The Atkins protein bars and shakes are available in more varieties at Target, [employer] and [place].     7 AM Breakfast:  Choose from the following:  Low carbohydrate Protein  Shakes (I recommend the EAS AdvantEdge "Carb Control" shakes, Atkins,  Muscle Milk or Premier Protein shakes  All are < 4  carbs . My favorite is the premier protein chocolate   a scrambled egg/bacon/cheese burrito made with Mission's "carb balance" whole wheat tortilla  (this particular tortilla has only 6 net carbs, once you subtract the fiber grams  )  A slice of home made fritatta (egg based dish without a crust:  google it)    Avoid cereal and bananas, oatmeal and cream of wheat and grits. They are loaded with carbohydrates and have too few fiber grams    10 AM: high protein snack  Protein bar by Atkins  Or KIND  (the lw sugar variety,  Not all are  low , under 200 cal, usually < 6 net carbs).    A stick of cheese:  Around 1 carb,  100 cal      Other so called "protein bars" and Greek yogurts tend to be loaded with carbohydrates.  Remember, in food advertising, the word "energy" is synonymous for " carbohydrate."  Lunch:   A Sandwich using the bread choices listed below,  You  Can use any  Eggs,  lunchmeat, grilled meat or canned tuna), avocado, regular mayo/mustard  and cheese.  A Salad using blue cheese, ranch,  Goddess or vinagrette,  No croutons or "confetti" and no "candied nuts" but regular nuts OK. No "fat free": they add sugar for taste  No pretzels or chips.  Pickles and miniature sweet peppers are a good low carb alternative that provide a "crunch"   Please Note:  The bread is the only source of carbohydrate in a sandwich and  can be decreased by trying some of these alternatives to traditional loaf bread. Kristopher Oppenheim has the best selection:  Joseph's makes a pita bread and a flat ("Lavash")  bread that are 50 cal and 4 net  carbs and are available at [place] and [place].  These can be toasted to make them taste better,  And can be  used as a pita chip to use with hummous as well  Toufayan makes a low carb flatbread that's 100 cal and 9 net carbs available at [employer] and [employer]J's makes 2 sizes of  Low carb whole wheat tortilla  (The large one is 210 cal and 6 net carbs, the small is 120 cal and also 6 net carbs)  Flat Out makes flatbreads that are low carb as well . They're called "Fold Its")   Avoid "Low fat dressings, as well as Barry Brunner and Pettus dressings They are loaded with sugar!   3 PM/ Mid day  Snack:  Consider  1 ounce of  almonds, walnuts, pistachios, pecans, peanuts,  Macadamia nuts or a nut medley are ok .  Avoid "granola"; the dried cranberries and raisins are loaded with carbohydrates. Mixed nuts as long as there are no raisins,  cranberries or dried fruit.    Try the prosciutto/mozzarella cheese  sticks by [employer]  In deli /backery section   High protein 80 cal each , 1 carb   To avoid overindulging in snacks: Try drinking a glass of unsweeted almond/coconut milk  Or a cup of coffee with your Atkins chocolate bar to keep you from having 3!!!   Pork rinds!  Yes Pork Rinds  Are on the diet .  They are your potato chip.        6 PM  Dinner:     Meat/fowl/fish with a green salad, with steamed/grilled/sauteed vegggie: broccoli, cauliflower, green beans, spinach, brussel sprouts or  Lima beans. DO NOT BREAD THE PROTEIN!!      There is a low carb pasta by Dreamfield's that is acceptable and tastes great: only 5 digestible carbs/serving.( All grocery stores but BJs carry it )  Try Hurley Cisco Angelo's chicken piccata or chicken or eggplant parm over low carb pasta.(Lowes and BJs)   Marjory Lies Sanchez's "Carnitas" (pulled pork, no sauce,  0 carbs) can be used to make a dinner  burrito (available at BJ's ) and his pot roast is also without veggies or potatoes   Pesto over low carb pasta (bj's sells a good quality pesto in the center refrigerated section of the deli   Try satueeing  Cheral Marker with mushrooms as another side dish   Whole wheat pasta is still full of digestible carbs and  Not as low in glycemic index as Dreamfield's.   Brown rice is still rice,  So skip the rice and noodles if you eat [place] or [place] (or at least limit to 1 cooked cup)  9 PM snack :   Breyer's "low carb" fudgsicle or  ice cream bar (Carb Smart line), or  Weight Watcher's ice cream bar , or another "no sugar added" ice cream;  a serving of fresh berries/cherries with whipped cream   Cheese or DANNON'S LlGHT N FIT GREEK YOGURT or the [employer] greek yogurt   8 ounces of [place] unsweetened almond/cococunut milk  Cheese and crackers (using WASA crackers,  They are low carb) or peanut butter on low carb crackers or pita bread     Avoid bananas, pineapple, grapes  and watermelon on a regular basis because they are high in sugar.   THINK OF THEM AS DESSERT. Stick to" berries and cherries"

## 2014-10-16 NOTE — Progress Notes (Signed)
Pre-visit discussion using our clinic review tool. No additional management support is needed unless otherwise documented below in the visit note.  

## 2014-10-20 DIAGNOSIS — G4733 Obstructive sleep apnea (adult) (pediatric): Secondary | ICD-10-CM | POA: Insufficient documentation

## 2014-10-20 NOTE — Assessment & Plan Note (Signed)
.  diagnosed with prior sleep study over 5 years ago  but treatment has been deferred d by patient.  Discussed the long term history of OSA , the risks of long term damage to heart and the signs and symptoms attributable to OSA.  Advised patient to consider  significant weight loss and/or use of CPAP.

## 2014-10-20 NOTE — Assessment & Plan Note (Signed)
Well controlled on current regimen. Renal function stable, no changes today. 

## 2014-10-20 NOTE — Assessment & Plan Note (Signed)
I have addressed  BMI and recommended wt loss of 12 lbs  over the next 3 months using a low glycemic index diet and regular exercise a minimum of 5 days per week.

## 2014-11-24 NOTE — Telephone Encounter (Signed)
Opened in error

## 2015-02-27 ENCOUNTER — Other Ambulatory Visit: Payer: Self-pay | Admitting: Internal Medicine

## 2015-02-27 NOTE — Telephone Encounter (Signed)
Okay to refill? Not on med list. 

## 2015-03-01 NOTE — Telephone Encounter (Signed)
Ok to refill,  Refill sent  

## 2015-03-18 ENCOUNTER — Other Ambulatory Visit: Payer: Self-pay | Admitting: Internal Medicine

## 2015-03-18 DIAGNOSIS — E669 Obesity, unspecified: Secondary | ICD-10-CM

## 2015-03-18 DIAGNOSIS — I10 Essential (primary) hypertension: Secondary | ICD-10-CM

## 2015-03-18 DIAGNOSIS — Z1159 Encounter for screening for other viral diseases: Secondary | ICD-10-CM

## 2015-03-18 DIAGNOSIS — E785 Hyperlipidemia, unspecified: Secondary | ICD-10-CM

## 2015-03-18 DIAGNOSIS — E559 Vitamin D deficiency, unspecified: Secondary | ICD-10-CM

## 2015-07-23 ENCOUNTER — Telehealth: Payer: Self-pay | Admitting: Internal Medicine

## 2015-07-23 DIAGNOSIS — Z113 Encounter for screening for infections with a predominantly sexual mode of transmission: Secondary | ICD-10-CM

## 2015-07-23 DIAGNOSIS — R739 Hyperglycemia, unspecified: Secondary | ICD-10-CM

## 2015-07-23 DIAGNOSIS — Z125 Encounter for screening for malignant neoplasm of prostate: Secondary | ICD-10-CM

## 2015-07-23 DIAGNOSIS — C61 Malignant neoplasm of prostate: Secondary | ICD-10-CM

## 2015-07-23 DIAGNOSIS — I10 Essential (primary) hypertension: Secondary | ICD-10-CM

## 2015-07-23 NOTE — Telephone Encounter (Signed)
Can you please call patient to arrange fasting labs appointment he will need ALL future labs (including ones ordered n November)

## 2015-07-24 NOTE — Telephone Encounter (Signed)
Left message for patient to return call to office. 

## 2015-07-29 NOTE — Telephone Encounter (Signed)
Patient scheduled.

## 2015-08-05 ENCOUNTER — Other Ambulatory Visit: Payer: Self-pay | Admitting: Internal Medicine

## 2015-08-05 DIAGNOSIS — E785 Hyperlipidemia, unspecified: Secondary | ICD-10-CM

## 2015-08-05 DIAGNOSIS — M545 Low back pain, unspecified: Secondary | ICD-10-CM

## 2015-08-05 DIAGNOSIS — M1990 Unspecified osteoarthritis, unspecified site: Secondary | ICD-10-CM

## 2015-08-05 DIAGNOSIS — M5442 Lumbago with sciatica, left side: Secondary | ICD-10-CM

## 2015-08-06 ENCOUNTER — Other Ambulatory Visit (INDEPENDENT_AMBULATORY_CARE_PROVIDER_SITE_OTHER): Payer: BLUE CROSS/BLUE SHIELD

## 2015-08-06 ENCOUNTER — Other Ambulatory Visit: Payer: Self-pay | Admitting: Internal Medicine

## 2015-08-06 ENCOUNTER — Encounter: Payer: Self-pay | Admitting: Internal Medicine

## 2015-08-06 DIAGNOSIS — E669 Obesity, unspecified: Secondary | ICD-10-CM

## 2015-08-06 DIAGNOSIS — Z125 Encounter for screening for malignant neoplasm of prostate: Secondary | ICD-10-CM

## 2015-08-06 DIAGNOSIS — E559 Vitamin D deficiency, unspecified: Secondary | ICD-10-CM

## 2015-08-06 DIAGNOSIS — M545 Low back pain, unspecified: Secondary | ICD-10-CM

## 2015-08-06 DIAGNOSIS — Z113 Encounter for screening for infections with a predominantly sexual mode of transmission: Secondary | ICD-10-CM

## 2015-08-06 DIAGNOSIS — I10 Essential (primary) hypertension: Secondary | ICD-10-CM

## 2015-08-06 DIAGNOSIS — M1990 Unspecified osteoarthritis, unspecified site: Secondary | ICD-10-CM | POA: Diagnosis not present

## 2015-08-06 DIAGNOSIS — R739 Hyperglycemia, unspecified: Secondary | ICD-10-CM | POA: Diagnosis not present

## 2015-08-06 DIAGNOSIS — E785 Hyperlipidemia, unspecified: Secondary | ICD-10-CM

## 2015-08-06 LAB — HIV ANTIBODY (ROUTINE TESTING W REFLEX): HIV 1&2 Ab, 4th Generation: NONREACTIVE

## 2015-08-06 LAB — COMPREHENSIVE METABOLIC PANEL
ALT: 22 U/L (ref 0–53)
AST: 18 U/L (ref 0–37)
Albumin: 4.2 g/dL (ref 3.5–5.2)
Alkaline Phosphatase: 48 U/L (ref 39–117)
BUN: 13 mg/dL (ref 6–23)
CALCIUM: 9.4 mg/dL (ref 8.4–10.5)
CHLORIDE: 102 meq/L (ref 96–112)
CO2: 33 meq/L — AB (ref 19–32)
Creatinine, Ser: 0.64 mg/dL (ref 0.40–1.50)
GFR: 136.74 mL/min (ref >60.00)
Glucose, Bld: 92 mg/dL (ref 70–99)
POTASSIUM: 4.2 meq/L (ref 3.5–5.1)
Sodium: 141 mEq/L (ref 135–145)
Total Bilirubin: 0.7 mg/dL (ref 0.2–1.2)
Total Protein: 6.4 g/dL (ref 6.0–8.3)

## 2015-08-06 LAB — URINALYSIS, ROUTINE W REFLEX MICROSCOPIC
Bilirubin Urine: NEGATIVE
HGB URINE DIPSTICK: NEGATIVE
KETONES UR: NEGATIVE
Leukocytes, UA: NEGATIVE
Nitrite: NEGATIVE
RBC / HPF: NONE SEEN (ref 0–?)
SPECIFIC GRAVITY, URINE: 1.01 (ref 1.000–1.030)
Total Protein, Urine: NEGATIVE
URINE GLUCOSE: NEGATIVE
UROBILINOGEN UA: 0.2 (ref 0.0–1.0)
WBC UA: NONE SEEN (ref 0–?)
pH: 7.5 (ref 5.0–8.0)

## 2015-08-06 LAB — MICROALBUMIN / CREATININE URINE RATIO
CREATININE, U: 62.3 mg/dL
MICROALB/CREAT RATIO: 1.1 mg/g (ref 0.0–30.0)

## 2015-08-06 LAB — HEMOGLOBIN A1C: Hgb A1c MFr Bld: 5.4 % (ref 4.6–6.5)

## 2015-08-06 LAB — LIPID PANEL
CHOLESTEROL: 177 mg/dL (ref 0–200)
HDL: 47.3 mg/dL (ref >39.00)
LDL Cholesterol: 110 mg/dL — ABNORMAL HIGH (ref 0–99)
NonHDL: 129.54
Total CHOL/HDL Ratio: 4
Triglycerides: 96 mg/dL (ref 0.0–149.0)
VLDL: 19.2 mg/dL (ref 0.0–40.0)

## 2015-08-06 LAB — URIC ACID: URIC ACID, SERUM: 5 mg/dL (ref 4.0–7.8)

## 2015-08-06 LAB — SEDIMENTATION RATE: SED RATE: 14 mm/h (ref 0–22)

## 2015-08-06 LAB — VITAMIN D 25 HYDROXY (VIT D DEFICIENCY, FRACTURES): VITD: 16.34 ng/mL — ABNORMAL LOW (ref 30.00–100.00)

## 2015-08-06 LAB — PSA: PSA: 0.76 ng/mL (ref 0.10–4.00)

## 2015-08-06 LAB — LDL CHOLESTEROL, DIRECT: LDL DIRECT: 86 mg/dL

## 2015-08-06 LAB — HEPATITIS C ANTIBODY: HCV Ab: NEGATIVE

## 2015-08-06 LAB — TSH: TSH: 0.57 u[IU]/mL (ref 0.35–4.50)

## 2015-08-06 MED ORDER — ERGOCALCIFEROL 1.25 MG (50000 UT) PO CAPS: 50000.0000 [IU] | ORAL_CAPSULE | ORAL | Status: DC

## 2015-08-06 MED ORDER — TRAMADOL HCL 50 MG PO TABS: 50.0000 mg | ORAL_TABLET | Freq: Four times a day (QID) | ORAL | Status: DC | PRN

## 2015-08-06 NOTE — Addendum Note (Signed)
Addended by: Crecencio Mc on: 08/06/2015 05:53 PM   Modules accepted: Orders

## 2015-08-07 LAB — ANA W/REFLEX IF POSITIVE: Anti Nuclear Antibody(ANA): NEGATIVE

## 2015-08-07 LAB — RHEUMATOID FACTOR

## 2015-08-14 ENCOUNTER — Telehealth: Payer: Self-pay

## 2015-08-14 DIAGNOSIS — Z8249 Family history of ischemic heart disease and other diseases of the circulatory system: Secondary | ICD-10-CM

## 2015-08-14 NOTE — Telephone Encounter (Signed)
Pt's friend, Grover Canavan in the office to est care w/ Dr. Rockey Situ. She is sched for CT calcium score in [place] and would like for Mr. Erb to be scheduled, as well. Spoke w/ pt on the phone and he is agreeable to scheduling test.   Order placed and pt is sched for next Wed 4/26 @ 3:30, arrive @ [facility] @ 3:15. Gave instructions to Ms. Kelby Fam to provide to pt.  Asked him to call back w/ any questions or concerns.

## 2015-08-20 ENCOUNTER — Ambulatory Visit (INDEPENDENT_AMBULATORY_CARE_PROVIDER_SITE_OTHER)
Admission: RE | Admit: 2015-08-20 | Discharge: 2015-08-20 | Disposition: A | Discharge status: Home / Self Care | Payer: Self-pay | Source: Ambulatory Visit | Attending: Cardiovascular Disease | Admitting: Cardiovascular Disease

## 2015-08-20 DIAGNOSIS — Z8249 Family history of ischemic heart disease and other diseases of the circulatory system: Secondary | ICD-10-CM

## 2015-08-26 NOTE — Telephone Encounter (Signed)
Mailed unread message to patient.  

## 2015-09-02 ENCOUNTER — Other Ambulatory Visit: Payer: Self-pay | Admitting: Internal Medicine

## 2015-10-13 DIAGNOSIS — S93311D Subluxation of tarsal joint of right foot, subsequent encounter: Secondary | ICD-10-CM | POA: Diagnosis not present

## 2015-10-13 DIAGNOSIS — M1460 Charcot's joint, unspecified site: Secondary | ICD-10-CM | POA: Diagnosis not present

## 2015-10-13 DIAGNOSIS — M19071 Primary osteoarthritis, right ankle and foot: Secondary | ICD-10-CM | POA: Diagnosis not present

## 2015-10-13 DIAGNOSIS — M25571 Pain in right ankle and joints of right foot: Secondary | ICD-10-CM | POA: Diagnosis not present

## 2015-10-13 DIAGNOSIS — M216X1 Other acquired deformities of right foot: Secondary | ICD-10-CM | POA: Diagnosis not present

## 2016-01-12 DIAGNOSIS — S93301A Unspecified subluxation of right foot, initial encounter: Secondary | ICD-10-CM | POA: Diagnosis not present

## 2016-01-12 DIAGNOSIS — M1460 Charcot's joint, unspecified site: Secondary | ICD-10-CM | POA: Diagnosis not present

## 2016-01-12 DIAGNOSIS — M25571 Pain in right ankle and joints of right foot: Secondary | ICD-10-CM | POA: Diagnosis not present

## 2016-01-12 DIAGNOSIS — M216X1 Other acquired deformities of right foot: Secondary | ICD-10-CM | POA: Diagnosis not present

## 2016-01-12 DIAGNOSIS — M19071 Primary osteoarthritis, right ankle and foot: Secondary | ICD-10-CM | POA: Diagnosis not present

## 2016-01-21 DIAGNOSIS — Z23 Encounter for immunization: Secondary | ICD-10-CM | POA: Diagnosis not present

## 2016-01-28 DIAGNOSIS — M8580 Other specified disorders of bone density and structure, unspecified site: Secondary | ICD-10-CM | POA: Diagnosis not present

## 2016-01-28 DIAGNOSIS — M2011 Hallux valgus (acquired), right foot: Secondary | ICD-10-CM | POA: Diagnosis not present

## 2016-01-28 DIAGNOSIS — M19071 Primary osteoarthritis, right ankle and foot: Secondary | ICD-10-CM | POA: Diagnosis not present

## 2016-01-28 DIAGNOSIS — M2141 Flat foot [pes planus] (acquired), right foot: Secondary | ICD-10-CM | POA: Diagnosis not present

## 2016-01-28 DIAGNOSIS — M25571 Pain in right ankle and joints of right foot: Secondary | ICD-10-CM | POA: Diagnosis not present

## 2016-01-28 DIAGNOSIS — M79671 Pain in right foot: Secondary | ICD-10-CM | POA: Diagnosis not present

## 2016-04-12 ENCOUNTER — Ambulatory Visit (INDEPENDENT_AMBULATORY_CARE_PROVIDER_SITE_OTHER): Payer: BLUE CROSS/BLUE SHIELD

## 2016-04-12 ENCOUNTER — Encounter: Payer: Self-pay | Admitting: Internal Medicine

## 2016-04-12 ENCOUNTER — Other Ambulatory Visit: Payer: Self-pay | Admitting: Internal Medicine

## 2016-04-12 DIAGNOSIS — M542 Cervicalgia: Secondary | ICD-10-CM

## 2016-04-12 DIAGNOSIS — M47816 Spondylosis without myelopathy or radiculopathy, lumbar region: Secondary | ICD-10-CM | POA: Diagnosis not present

## 2016-04-12 DIAGNOSIS — M5432 Sciatica, left side: Secondary | ICD-10-CM | POA: Diagnosis not present

## 2016-04-12 DIAGNOSIS — M47812 Spondylosis without myelopathy or radiculopathy, cervical region: Secondary | ICD-10-CM | POA: Diagnosis not present

## 2016-04-27 NOTE — Telephone Encounter (Signed)
Mailed unread message to patient.  

## 2016-08-25 ENCOUNTER — Other Ambulatory Visit: Payer: Self-pay | Admitting: Internal Medicine

## 2016-08-25 MED ORDER — HYDROCOD POLST-CPM POLST ER 10-8 MG/5ML PO SUER: 5.0000 mL | Freq: Every evening | ORAL | 0 refills | Status: DC | PRN

## 2016-10-05 ENCOUNTER — Telehealth: Payer: Self-pay | Admitting: Internal Medicine

## 2016-10-05 NOTE — Telephone Encounter (Signed)
Both medications refilled: 09/03/2015 Last OV: 10/16/2015 Next OV: 11/08/2016 Last Labs: 08/06/2015

## 2016-10-06 NOTE — Telephone Encounter (Signed)
RefillED.  PLEASE MAKE HIM AN APPT FOR FASTING LABS PRIOR TO  APPT

## 2016-10-07 NOTE — Telephone Encounter (Signed)
Spoke with pt and scheduled him a fasting lab appt. Pt is aware of appt date and time.

## 2016-10-07 NOTE — Telephone Encounter (Signed)
Pt called and left a voicemail returning your call. Please advise, thank you!  Call pt @ [phone]

## 2016-10-07 NOTE — Telephone Encounter (Signed)
LMTCB

## 2016-10-28 ENCOUNTER — Telehealth: Payer: Self-pay | Admitting: Radiology

## 2016-10-28 DIAGNOSIS — I10 Essential (primary) hypertension: Secondary | ICD-10-CM

## 2016-10-28 DIAGNOSIS — R5383 Other fatigue: Secondary | ICD-10-CM

## 2016-10-28 DIAGNOSIS — E7800 Pure hypercholesterolemia, unspecified: Secondary | ICD-10-CM

## 2016-10-28 DIAGNOSIS — Z125 Encounter for screening for malignant neoplasm of prostate: Secondary | ICD-10-CM

## 2016-10-28 NOTE — Telephone Encounter (Signed)
Pt coming in for labs tomorrow, please place future orders. Thank you.  

## 2016-10-28 NOTE — Telephone Encounter (Signed)
Lab Results  Component Value Date   PSA 0.76 08/06/2015   PSA 0.82 07/18/2014   FASTING LABS AND PSA ORDERED

## 2016-10-28 NOTE — Addendum Note (Signed)
Addended by: Crecencio Mc on: 10/28/2016 10:14 AM   Modules accepted: Orders

## 2016-10-29 ENCOUNTER — Other Ambulatory Visit (INDEPENDENT_AMBULATORY_CARE_PROVIDER_SITE_OTHER): Payer: BLUE CROSS/BLUE SHIELD

## 2016-10-29 DIAGNOSIS — Z125 Encounter for screening for malignant neoplasm of prostate: Secondary | ICD-10-CM

## 2016-10-29 DIAGNOSIS — I10 Essential (primary) hypertension: Secondary | ICD-10-CM

## 2016-10-29 DIAGNOSIS — R5383 Other fatigue: Secondary | ICD-10-CM

## 2016-10-29 DIAGNOSIS — E7800 Pure hypercholesterolemia, unspecified: Secondary | ICD-10-CM | POA: Diagnosis not present

## 2016-10-29 LAB — LIPID PANEL
CHOL/HDL RATIO: 3
Cholesterol: 183 mg/dL (ref 0–200)
HDL: 67.3 mg/dL (ref >39.00)
LDL CALC: 105 mg/dL — AB (ref 0–99)
NONHDL: 115.77
Triglycerides: 55 mg/dL (ref 0.0–149.0)
VLDL: 11 mg/dL (ref 0.0–40.0)

## 2016-10-29 LAB — COMPREHENSIVE METABOLIC PANEL
ALBUMIN: 4.1 g/dL (ref 3.5–5.2)
ALT: 21 U/L (ref 0–53)
AST: 18 U/L (ref 0–37)
Alkaline Phosphatase: 50 U/L (ref 39–117)
BUN: 20 mg/dL (ref 6–23)
CHLORIDE: 106 meq/L (ref 96–112)
CO2: 31 mEq/L (ref 19–32)
CREATININE: 0.63 mg/dL (ref 0.40–1.50)
Calcium: 9.3 mg/dL (ref 8.4–10.5)
GFR: 138.65 mL/min (ref >60.00)
GLUCOSE: 104 mg/dL — AB (ref 70–99)
POTASSIUM: 4.5 meq/L (ref 3.5–5.1)
SODIUM: 143 meq/L (ref 135–145)
Total Bilirubin: 0.7 mg/dL (ref 0.2–1.2)
Total Protein: 6.3 g/dL (ref 6.0–8.3)

## 2016-10-29 LAB — CBC WITH DIFFERENTIAL/PLATELET
BASOS ABS: 0.1 10*3/uL (ref 0.0–0.1)
Basophils Relative: 1.2 % (ref 0.0–3.0)
EOS ABS: 0.2 10*3/uL (ref 0.0–0.7)
Eosinophils Relative: 3.3 % (ref 0.0–5.0)
HCT: 43.7 % (ref 39.0–52.0)
Hemoglobin: 15 g/dL (ref 13.0–17.0)
LYMPHS ABS: 1.1 10*3/uL (ref 0.7–4.0)
Lymphocytes Relative: 22 % (ref 12.0–46.0)
MCHC: 34.2 g/dL (ref 30.0–36.0)
MCV: 93.2 fl (ref 78.0–100.0)
MONO ABS: 0.6 10*3/uL (ref 0.1–1.0)
MONOS PCT: 11.3 % (ref 3.0–12.0)
NEUTROS ABS: 3.1 10*3/uL (ref 1.4–7.7)
NEUTROS PCT: 62.2 % (ref 43.0–77.0)
PLATELETS: 209 10*3/uL (ref 150.0–400.0)
RBC: 4.69 Mil/uL (ref 4.22–5.81)
RDW: 13.8 % (ref 11.5–15.5)
WBC: 5 10*3/uL (ref 4.0–10.5)

## 2016-10-29 LAB — PSA: PSA: 0.97 ng/mL (ref 0.10–4.00)

## 2016-10-29 LAB — TSH: TSH: 0.67 u[IU]/mL (ref 0.35–4.50)

## 2016-10-31 ENCOUNTER — Encounter: Payer: Self-pay | Admitting: Internal Medicine

## 2016-11-08 ENCOUNTER — Ambulatory Visit (INDEPENDENT_AMBULATORY_CARE_PROVIDER_SITE_OTHER): Payer: BLUE CROSS/BLUE SHIELD | Admitting: Internal Medicine

## 2016-11-08 ENCOUNTER — Encounter: Payer: Self-pay | Admitting: Internal Medicine

## 2016-11-08 VITALS — BP 120/86 | HR 81 | Temp 98.7°F | Resp 16 | Ht 69.0 in | Wt 244.4 lb

## 2016-11-08 DIAGNOSIS — Z0000 Encounter for general adult medical examination without abnormal findings: Secondary | ICD-10-CM

## 2016-11-08 DIAGNOSIS — M4722 Other spondylosis with radiculopathy, cervical region: Secondary | ICD-10-CM | POA: Diagnosis not present

## 2016-11-08 DIAGNOSIS — I10 Essential (primary) hypertension: Secondary | ICD-10-CM

## 2016-11-08 DIAGNOSIS — E669 Obesity, unspecified: Secondary | ICD-10-CM | POA: Diagnosis not present

## 2016-11-08 DIAGNOSIS — M2141 Flat foot [pes planus] (acquired), right foot: Secondary | ICD-10-CM

## 2016-11-08 NOTE — Progress Notes (Signed)
Patient ID: John Wolf, male    DOB: 01-03-1956  Age: 61 y.o. MRN: [id number]  The patient is here for annual preventive examination and management of other chronic and acute problems.   The risk factors are reflected in the social history.  The roster of all physicians providing medical care to patient - is listed in the Snapshot section of the chart.  Activities of daily living:  The patient is 100% independent in all ADLs: dressing, toileting, feeding as well as independent mobility  Home safety : The patient has smoke detectors in the home. They wear seatbelts.  There are secured firearms at home. There is no violence in the home.   There is no risks for hepatitis, STDs or HIV. There is no   history of blood transfusion. They have no travel history to infectious disease endemic areas of the world.  The patient has seen their dentist in the last six month. They have NOT seen their eye doctor in the last year. They have no  hearing difficulty .    They do not  have excessive sun exposure. Discussed the need for sun protection: hats, long sleeves and use of sunscreen if there is significant sun exposure.   Diet: the importance of a healthy diet is discussed. They do have a healthy diet..  He has lost 60 lbs since his visit in 2016  The benefits of regular aerobic exercise were discussed. He does not exercise due to severe degenerative orthopedic issues .   Depression screen: there are no signs or vegative symptoms of depression- irritability, change in appetite, anhedonia, sadness/tearfullness.  Cognitive assessment: the patient manages all their financial and personal affairs and is actively engaged. They could relate day,date,year and events; recalled 2/3 objects at 3 minutes; performed clock-face test normally.  The following portions of the patient's history were reviewed and updated as appropriate: allergies, current medications, past family history, past medical history,  past  surgical history, past social history  and problem list.  Visual acuity was not assessed per patient preference since she has regular follow up with her ophthalmologist. Hearing and body mass index were assessed and reviewed.   During the course of the visit the patient was educated and counseled about appropriate screening and preventive services including : fall prevention , diabetes screening, nutrition counseling, colorectal cancer screening, and recommended immunizations.    CC: Diagnoses of Essential hypertension, Acquired right flat foot, Obesity (BMI 30-39.9), Visit for preventive health examination, and Cervical radiculopathy due to degenerative joint disease of spine were pertinent to this visit.  persistent right ankle pain .  His  Acquired deformity has resulted in an antalgic gait that has resulted in scoliosis of the lower spine and left hip pain.   2) bilateral hand numbness brought on by driving and sleeping .  He has noticed a gradual diminishment of his strength involving his upper limbs .     History John Wolf has a past medical history of Acquired pes planus; Arthritis; Benign neoplasm of colon; Hyperlipidemia; Hypertension; Obesity (BMI 30-39.9); Onychia and paronychia of toe; and Sleep apnea, obstructive (2002).   He has a past surgical history that includes left shoulder.   His family history includes Cancer (age of onset: 37) in his brother; Heart disease in his mother; Hypertension in his brother; Stroke in his brother and father.He reports that he has never smoked. He has never used smokeless tobacco. He reports that he drinks alcohol. His drug history is not on file.  Outpatient Medications Prior to Visit  Medication Sig Dispense Refill  . chlorpheniramine-HYDROcodone (TUSSIONEX PENNKINETIC ER) 10-8 MG/5ML SUER Take 5 mLs by mouth at bedtime as needed for cough. 140 mL 0  . clotrimazole-betamethasone (LOTRISONE) cream APPLY TWICE A DAY 45 g 2  . Diclofenac Sodium CR 100  MG 24 hr tablet TAKE 1 TABLET EVERY DAY 90 tablet 0  . Omega-3 Fatty Acids (FISH OIL PO) Take by mouth.    . traMADol (ULTRAM) 50 MG tablet Take 1 tablet (50 mg total) by mouth every 6 (six) hours as needed. 90 tablet 3  . triamterene-hydrochlorothiazide (MAXZIDE-25) 37.5-25 MG tablet TAKE ONE TABLET EVERY MORNING 90 tablet 0  . ergocalciferol (DRISDOL) 50000 units capsule Take 1 capsule (50,000 Units total) by mouth once a week. 12 capsule 0   No facility-administered medications prior to visit.     Review of Systems   Patient denies headache, fevers, malaise, unintentional weight loss, skin rash, eye pain, sinus congestion and sinus pain, sore throat, dysphagia,  hemoptysis , cough, dyspnea, wheezing, chest pain, palpitations, orthopnea, edema, abdominal pain, nausea, melena, diarrhea, constipation, flank pain, dysuria, hematuria, urinary  Frequency, nocturia, numbness, tingling, seizures,  Focal weakness, Loss of consciousness,  Tremor, insomnia, depression, anxiety, and suicidal ideation.      Objective:  BP 120/86   Pulse 81   Temp 98.7 F (37.1 C) (Oral)   Resp 16   Ht 5\' 9"  (1.753 m)   Wt 244 lb 6.4 oz (110.9 kg)   SpO2 95%   BMI 36.09 kg/m   Physical Exam   General appearance: alert, cooperative and appears stated age Ears: normal TM's and external ear canals both ears Throat: lips, mucosa, and tongue normal; teeth and gums normal Neck: no adenopathy, no carotid bruit, supple, symmetrical, trachea midline and thyroid not enlarged, symmetric, no tenderness/mass/nodules Back: symmetric, no curvature. ROM normal. No CVA tenderness. Lungs: clear to auscultation bilaterally Heart: regular rate and rhythm, S1, S2 normal, no murmur, click, rub or gallop Abdomen: soft, non-tender; bowel sounds normal; no masses,  no organomegaly Pulses: 2+ and symmetric Skin: Skin color, texture, turgor normal. No rashes or lesions Lymph nodes: Cervical, supraclavicular, and axillary nodes  normal. MSK: right foot with acquired destructive changes and flat foot.  Neuro: upper arm strength 5/5 proximally and distally , negative Phalen's test, positive Tinel's sign.    Assessment & Plan:   Problem List Items Addressed This Visit    Acquired pes planus    Severe,  Differing opinions on the nature of the corrective surgery needed have been offered by [facility] and by [facility]      Cervical radiculopathy due to degenerative joint disease of spine    Vs CTS.  Discussed further eval with MRI vs EMG/Nerve conduction studies       Hypertension    Well controlled on current regimen. Renal function stable, no changes today.  Lab Results  Component Value Date   CREATININE 0.63 10/29/2016   Lab Results  Component Value Date   NA 143 10/29/2016   K 4.5 10/29/2016   CL 106 10/29/2016   CO2 31 10/29/2016         Obesity (BMI 30-39.9)    I have congratulated im reduction of   BMI and encouraged  Continued weight loss with goal of 10% of body weigh over the next 6 months using the [employer]  Program       Visit for preventive health examination    Annual comprehensive  preventive exam was done as well as an evaluation and management of acute and chronic conditions .  During the course of the visit the patient was educated and counseled about appropriate screening and preventive services including :  diabetes screening, lipid analysis with projected  10 year  risk for CAD , nutrition counseling, prostate and colorectal cancer screening, and recommended immunizations.  Printed recommendations for health maintenance screenings was given.          I have discontinued Mr. Forrest's ergocalciferol. I am also having him maintain his Omega-3 Fatty Acids (FISH OIL PO), clotrimazole-betamethasone, traMADol, chlorpheniramine-HYDROcodone, Diclofenac Sodium CR, and triamterene-hydrochlorothiazide.  No orders of the defined types were placed in this encounter.   Medications Discontinued  During This Encounter  Medication Reason  . ergocalciferol (DRISDOL) 50000 units capsule Completed Course    Follow-up: No Follow-up on file.   Crecencio Mc, MD

## 2016-11-08 NOTE — Patient Instructions (Signed)
Your cholesterol and blood pressure are both excellent since you lost 60 lbs!!  No changes are needed    You are overdue for an eye exam and glaucoma screening.  I am ordering EMG nerve conduction  studies to rule out carpal tunnel syndrome   Health Maintenance, Male A healthy lifestyle and preventive care is important for your health and wellness. Ask your health care provider about what schedule of regular examinations is right for you. What should I know about weight and diet? Eat a Healthy Diet  Eat plenty of vegetables, fruits, whole grains, low-fat dairy products, and lean protein.  Do not eat a lot of foods high in solid fats, added sugars, or salt.  Maintain a Healthy Weight Regular exercise can help you achieve or maintain a healthy weight. You should:  Do at least 150 minutes of exercise each week. The exercise should increase your heart rate and make you sweat (moderate-intensity exercise).  Do strength-training exercises at least twice a week.  Watch Your Levels of Cholesterol and Blood Lipids  Have your blood tested for lipids and cholesterol every 5 years starting at 59 years of age. If you are at high risk for heart disease, you should start having your blood tested when you are 59 years old. You may need to have your cholesterol levels checked more often if: ? Your lipid or cholesterol levels are high. ? You are older than 59 years of age. ? You are at high risk for heart disease.  What should I know about cancer screening? Many types of cancers can be detected early and may often be prevented. Lung Cancer  You should be screened every year for lung cancer if: ? You are a current smoker who has smoked for at least 30 years. ? You are a former smoker who has quit within the past 15 years.  Talk to your health care provider about your screening options, when you should start screening, and how often you should be screened.  Colorectal Cancer  Routine  colorectal cancer screening usually begins at 59 years of age and should be repeated every 5-10 years until you are 59 years old. You may need to be screened more often if early forms of precancerous polyps or small growths are found. Your health care provider may recommend screening at an earlier age if you have risk factors for colon cancer.  Your health care provider may recommend using home test kits to check for hidden blood in the stool.  A small camera at the end of a tube can be used to examine your colon (sigmoidoscopy or colonoscopy). This checks for the earliest forms of colorectal cancer.  Prostate and Testicular Cancer  Depending on your age and overall health, your health care provider may do certain tests to screen for prostate and testicular cancer.  Talk to your health care provider about any symptoms or concerns you have about testicular or prostate cancer.  Skin Cancer  Check your skin from head to toe regularly.  Tell your health care provider about any new moles or changes in moles, especially if: ? There is a change in a mole's size, shape, or color. ? You have a mole that is larger than a pencil eraser.  Always use sunscreen. Apply sunscreen liberally and repeat throughout the day.  Protect yourself by wearing long sleeves, pants, a wide-brimmed hat, and sunglasses when outside.  What should I know about heart disease, diabetes, and high blood pressure?  If you  are 50-33 years of age, have your blood pressure checked every 3-5 years. If you are 68 years of age or older, have your blood pressure checked every year. You should have your blood pressure measured twice-once when you are at a hospital or clinic, and once when you are not at a hospital or clinic. Record the average of the two measurements. To check your blood pressure when you are not at a hospital or clinic, you can use: ? An automated blood pressure machine at a pharmacy. ? A home blood pressure  monitor.  Talk to your health care provider about your target blood pressure.  If you are between 83-30 years old, ask your health care provider if you should take aspirin to prevent heart disease.  Have regular diabetes screenings by checking your fasting blood sugar level. ? If you are at a normal weight and have a low risk for diabetes, have this test once every three years after the age of 63. ? If you are overweight and have a high risk for diabetes, consider being tested at a younger age or more often.  A one-time screening for abdominal aortic aneurysm (AAA) by ultrasound is recommended for men aged 47-75 years who are current or former smokers. What should I know about preventing infection? Hepatitis B If you have a higher risk for hepatitis B, you should be screened for this virus. Talk with your health care provider to find out if you are at risk for hepatitis B infection. Hepatitis C Blood testing is recommended for:  Everyone born from [birth year] through 1963.  Anyone with known risk factors for hepatitis C.  Sexually Transmitted Diseases (STDs)  You should be screened each year for STDs including gonorrhea and chlamydia if: ? You are sexually active and are younger than 59 years of age. ? You are older than 59 years of age and your health care provider tells you that you are at risk for this type of infection. ? Your sexual activity has changed since you were last screened and you are at an increased risk for chlamydia or gonorrhea. Ask your health care provider if you are at risk.  Talk with your health care provider about whether you are at high risk of being infected with HIV. Your health care provider may recommend a prescription medicine to help prevent HIV infection.  What else can I do?  Schedule regular health, dental, and eye exams.  Stay current with your vaccines (immunizations).  Do not use any tobacco products, such as cigarettes, chewing tobacco, and  e-cigarettes. If you need help quitting, ask your health care provider.  Limit alcohol intake to no more than 2 drinks per day. One drink equals 12 ounces of beer, 5 ounces of wine, or 1 ounces of hard liquor.  Do not use street drugs.  Do not share needles.  Ask your health care provider for help if you need support or information about quitting drugs.  Tell your health care provider if you often feel depressed.  Tell your health care provider if you have ever been abused or do not feel safe at home. This information is not intended to replace advice given to you by your health care provider. Make sure you discuss any questions you have with your health care provider. Document Released: 10/09/2007 Document Revised: 12/10/2015 Document Reviewed: 01/14/2015 Elsevier Interactive Patient Education  [employer].

## 2016-11-09 DIAGNOSIS — M4722 Other spondylosis with radiculopathy, cervical region: Secondary | ICD-10-CM | POA: Insufficient documentation

## 2016-11-09 NOTE — Assessment & Plan Note (Signed)

## 2016-11-09 NOTE — Assessment & Plan Note (Signed)
Severe,  Differing opinions on the nature of the corrective surgery needed have been offered by [facility] and by [facility]

## 2016-11-09 NOTE — Assessment & Plan Note (Signed)
I have congratulated im reduction of   BMI and encouraged  Continued weight loss with goal of 10% of body weigh over the next 6 months using the [employer]  Program

## 2016-11-09 NOTE — Assessment & Plan Note (Signed)
Well controlled on current regimen. Renal function stable, no changes today.  Lab Results  Component Value Date   CREATININE 0.63 10/29/2016   Lab Results  Component Value Date   NA 143 10/29/2016   K 4.5 10/29/2016   CL 106 10/29/2016   CO2 31 10/29/2016

## 2016-11-09 NOTE — Assessment & Plan Note (Signed)
Vs CTS.  Discussed further eval with MRI vs EMG/Nerve conduction studies

## 2016-11-15 NOTE — Telephone Encounter (Signed)
Mailed unread message to patient, thanks 

## 2016-12-17 DIAGNOSIS — H5203 Hypermetropia, bilateral: Secondary | ICD-10-CM | POA: Diagnosis not present

## 2017-01-13 ENCOUNTER — Other Ambulatory Visit: Payer: Self-pay | Admitting: Internal Medicine

## 2017-04-21 IMAGING — DX DG LUMBAR SPINE COMPLETE 4+V
5 series · 5 of 5 positions shown · non-contrast
Comparison: 07/18/2007

CLINICAL DATA: Left-sided sciatica

EXAM:
LUMBAR SPINE - COMPLETE 4+ VIEW

[lumbar spine ap]
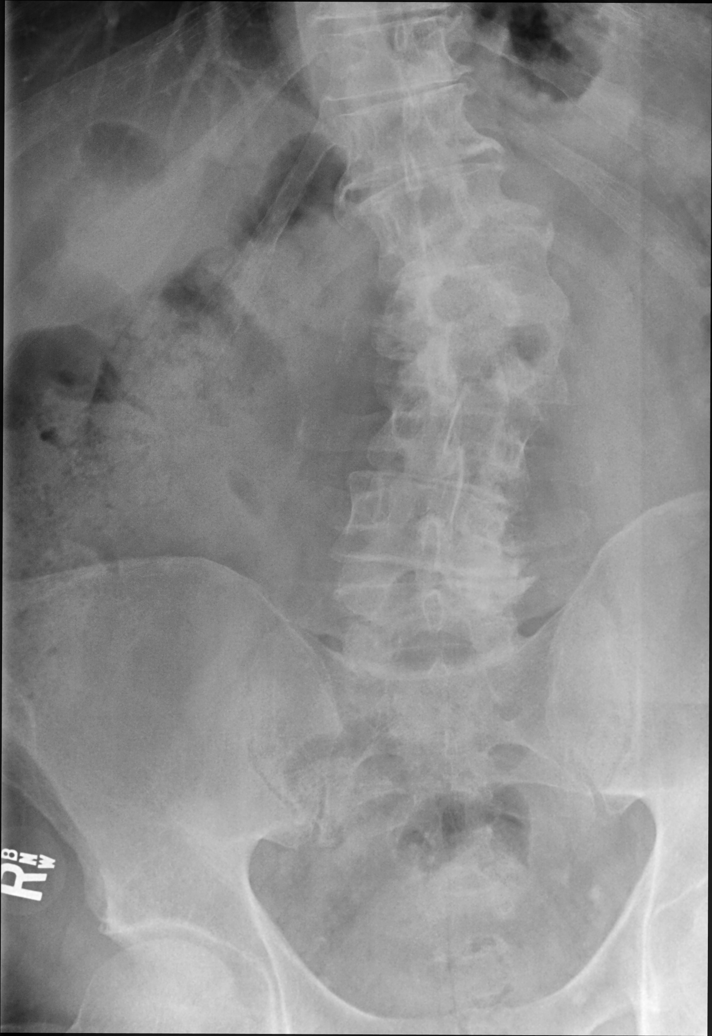

[lumbar spine obl (oblique) (1 of 2)]
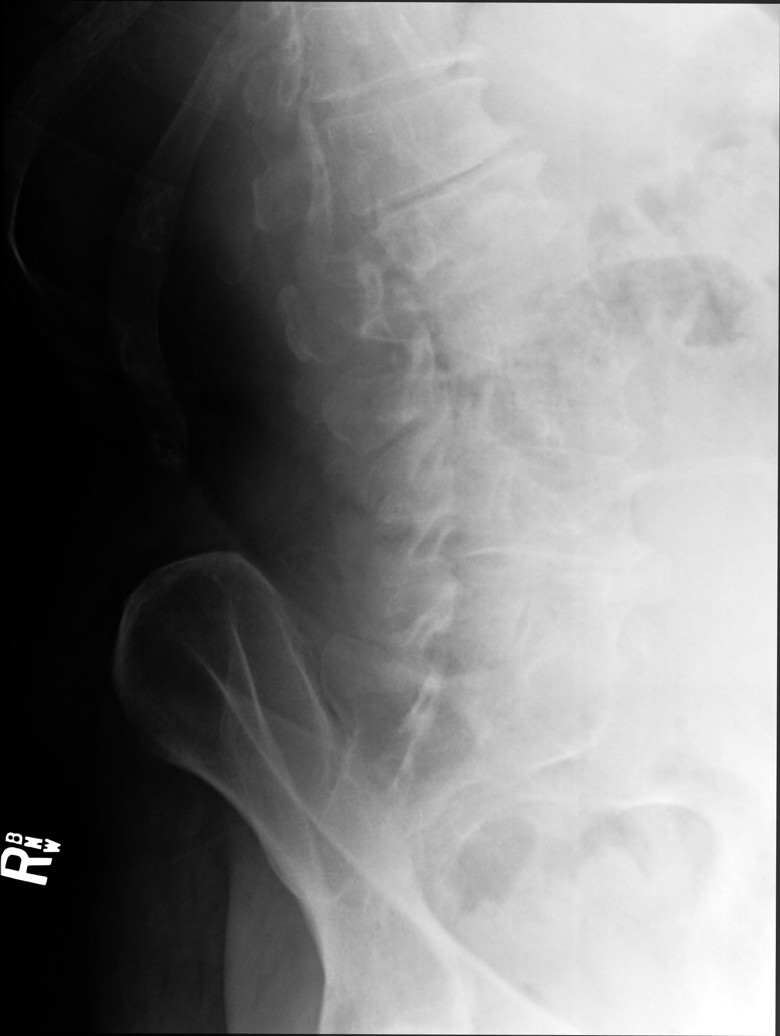

[lumbar spine obl (oblique) (2 of 2)]
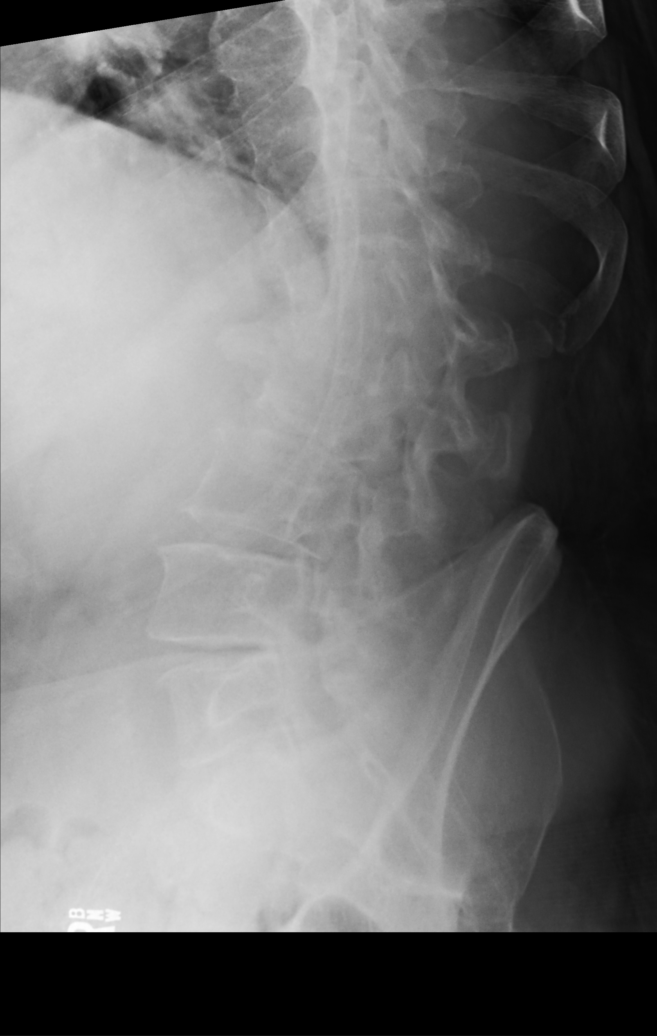

[lumbar spine lat]
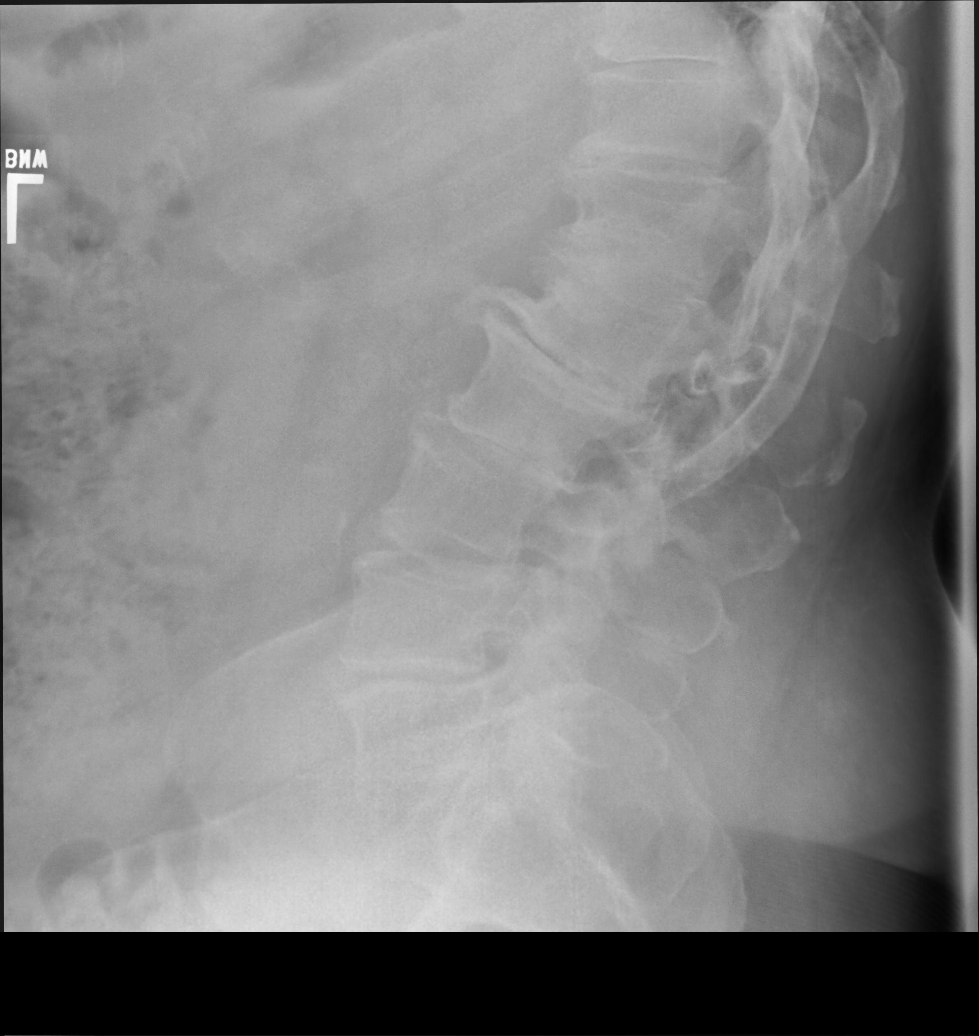

[lumbar spot lat]
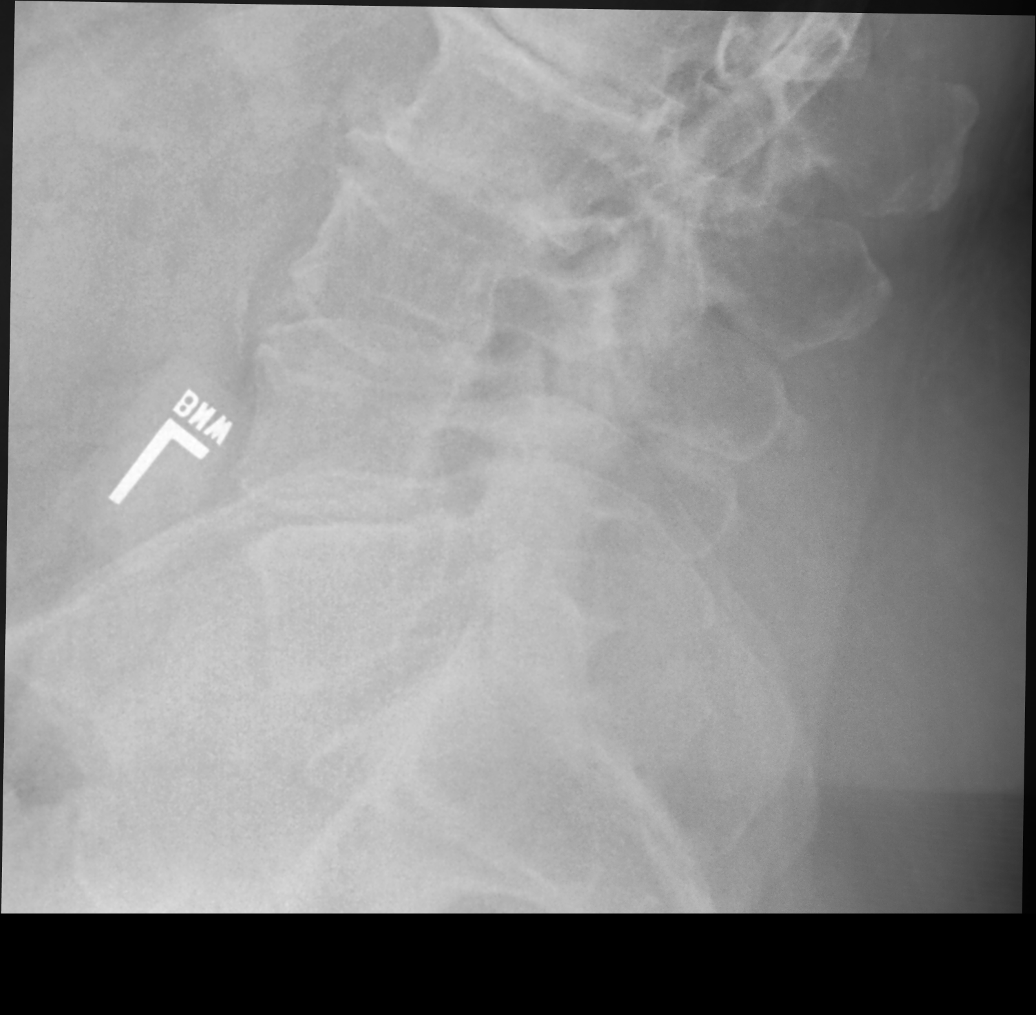

[5 of 5 positions shown; findings below may reference images not displayed]

FINDINGS: Five lumbar type vertebral bodies are well visualized. Vertebral
body height is well maintained. Mild scoliosis is noted concave to
the right centered at the L2 level. Associated osteophytic changes
are seen. No soft tissue abnormality is seen.
IMPRESSION: Degenerative change with scoliosis and osteophytic change.

## 2017-06-09 ENCOUNTER — Other Ambulatory Visit: Payer: Self-pay | Admitting: Internal Medicine

## 2017-06-09 MED ORDER — PREDNISONE 10 MG PO TABS: ORAL_TABLET | ORAL | 0 refills | Status: DC

## 2017-06-09 MED ORDER — TRAMADOL HCL 50 MG PO TABS: 50.0000 mg | ORAL_TABLET | Freq: Four times a day (QID) | ORAL | 3 refills | Status: DC | PRN

## 2017-06-09 NOTE — Progress Notes (Signed)
sending prednisone taper and tramadol for treatment of sciatica

## 2017-08-09 ENCOUNTER — Other Ambulatory Visit: Payer: Self-pay | Admitting: Internal Medicine

## 2017-08-09 DIAGNOSIS — R7301 Impaired fasting glucose: Secondary | ICD-10-CM

## 2017-08-09 DIAGNOSIS — I10 Essential (primary) hypertension: Secondary | ICD-10-CM

## 2017-08-09 MED ORDER — DICLOFENAC SODIUM 75 MG PO TBEC: 75.0000 mg | DELAYED_RELEASE_TABLET | Freq: Two times a day (BID) | ORAL | 5 refills | Status: DC

## 2017-09-30 ENCOUNTER — Other Ambulatory Visit: Payer: Self-pay | Admitting: Internal Medicine

## 2017-09-30 DIAGNOSIS — M5431 Sciatica, right side: Secondary | ICD-10-CM

## 2017-10-06 ENCOUNTER — Ambulatory Visit
Admission: RE | Admit: 2017-10-06 | Discharge: 2017-10-06 | Disposition: A | Discharge status: Home / Self Care | Payer: BLUE CROSS/BLUE SHIELD | Source: Ambulatory Visit | Attending: Internal Medicine | Admitting: Internal Medicine

## 2017-10-06 DIAGNOSIS — M5431 Sciatica, right side: Secondary | ICD-10-CM | POA: Insufficient documentation

## 2017-10-06 DIAGNOSIS — M545 Low back pain: Secondary | ICD-10-CM | POA: Diagnosis not present

## 2017-10-06 DIAGNOSIS — M48061 Spinal stenosis, lumbar region without neurogenic claudication: Secondary | ICD-10-CM | POA: Diagnosis not present

## 2017-10-11 ENCOUNTER — Other Ambulatory Visit: Payer: Self-pay | Admitting: Internal Medicine

## 2017-10-11 DIAGNOSIS — M5387 Other specified dorsopathies, lumbosacral region: Secondary | ICD-10-CM

## 2017-10-24 DIAGNOSIS — M5416 Radiculopathy, lumbar region: Secondary | ICD-10-CM | POA: Diagnosis not present

## 2017-10-24 DIAGNOSIS — M48062 Spinal stenosis, lumbar region with neurogenic claudication: Secondary | ICD-10-CM | POA: Diagnosis not present

## 2017-10-24 DIAGNOSIS — M5136 Other intervertebral disc degeneration, lumbar region: Secondary | ICD-10-CM | POA: Diagnosis not present

## 2017-11-13 ENCOUNTER — Telehealth: Payer: Self-pay | Admitting: Internal Medicine

## 2017-11-13 NOTE — Telephone Encounter (Signed)
Please print the letter I wrote on patient's behalf yesterday  Thanks

## 2017-11-14 NOTE — Telephone Encounter (Signed)
Letter has been printed and given to Dr. Derrel Nip.

## 2017-11-25 DIAGNOSIS — M5136 Other intervertebral disc degeneration, lumbar region: Secondary | ICD-10-CM | POA: Diagnosis not present

## 2017-11-25 DIAGNOSIS — M5416 Radiculopathy, lumbar region: Secondary | ICD-10-CM | POA: Diagnosis not present

## 2017-11-25 DIAGNOSIS — M48062 Spinal stenosis, lumbar region with neurogenic claudication: Secondary | ICD-10-CM | POA: Diagnosis not present

## 2018-01-23 ENCOUNTER — Other Ambulatory Visit: Payer: Self-pay | Admitting: Internal Medicine

## 2018-01-24 NOTE — Telephone Encounter (Signed)
LMTCB. Need to schedule pt an office visit with Dr. Tullo. Pt has not been seen in over a year.  

## 2018-04-08 ENCOUNTER — Other Ambulatory Visit: Payer: Self-pay | Admitting: Internal Medicine

## 2018-04-08 DIAGNOSIS — M25562 Pain in left knee: Secondary | ICD-10-CM

## 2018-04-08 NOTE — Progress Notes (Signed)
error 

## 2018-06-02 ENCOUNTER — Other Ambulatory Visit: Payer: Self-pay | Admitting: Internal Medicine

## 2018-06-02 DIAGNOSIS — E669 Obesity, unspecified: Secondary | ICD-10-CM

## 2018-06-02 DIAGNOSIS — R202 Paresthesia of skin: Secondary | ICD-10-CM

## 2018-06-02 DIAGNOSIS — R200 Anesthesia of skin: Secondary | ICD-10-CM

## 2018-06-02 DIAGNOSIS — Z125 Encounter for screening for malignant neoplasm of prostate: Secondary | ICD-10-CM

## 2018-06-02 DIAGNOSIS — I10 Essential (primary) hypertension: Secondary | ICD-10-CM

## 2018-06-03 NOTE — Telephone Encounter (Signed)
Patient contacted and advised that i'll refill but he needs labs ASAP,.  Please call him and given him a non fasting lab appt.

## 2018-06-13 NOTE — Telephone Encounter (Signed)
Patient scheduled for follow up lab appointment Friday 2/21.

## 2018-06-16 ENCOUNTER — Other Ambulatory Visit: Payer: BLUE CROSS/BLUE SHIELD

## 2018-06-16 ENCOUNTER — Other Ambulatory Visit (INDEPENDENT_AMBULATORY_CARE_PROVIDER_SITE_OTHER): Payer: BLUE CROSS/BLUE SHIELD

## 2018-06-16 DIAGNOSIS — R200 Anesthesia of skin: Secondary | ICD-10-CM

## 2018-06-16 DIAGNOSIS — I10 Essential (primary) hypertension: Secondary | ICD-10-CM | POA: Diagnosis not present

## 2018-06-16 DIAGNOSIS — E669 Obesity, unspecified: Secondary | ICD-10-CM

## 2018-06-16 DIAGNOSIS — R202 Paresthesia of skin: Secondary | ICD-10-CM

## 2018-06-16 DIAGNOSIS — Z125 Encounter for screening for malignant neoplasm of prostate: Secondary | ICD-10-CM

## 2018-06-16 LAB — COMPREHENSIVE METABOLIC PANEL
ALT: 25 U/L (ref 0–53)
AST: 19 U/L (ref 0–37)
Albumin: 4.5 g/dL (ref 3.5–5.2)
Alkaline Phosphatase: 54 U/L (ref 39–117)
BUN: 24 mg/dL — ABNORMAL HIGH (ref 6–23)
CO2: 32 mEq/L (ref 19–32)
Calcium: 9.5 mg/dL (ref 8.4–10.5)
Chloride: 102 mEq/L (ref 96–112)
Creatinine, Ser: 0.85 mg/dL (ref 0.40–1.50)
GFR: 91.82 mL/min (ref >60.00)
Glucose, Bld: 98 mg/dL (ref 70–99)
POTASSIUM: 4.9 meq/L (ref 3.5–5.1)
Sodium: 141 mEq/L (ref 135–145)
Total Bilirubin: 0.8 mg/dL (ref 0.2–1.2)
Total Protein: 6.7 g/dL (ref 6.0–8.3)

## 2018-06-16 LAB — VITAMIN B12: VITAMIN B 12: 255 pg/mL (ref 211–911)

## 2018-06-16 LAB — HEMOGLOBIN A1C: Hgb A1c MFr Bld: 5.2 % (ref 4.6–6.5)

## 2018-06-16 LAB — TSH: TSH: 0.54 u[IU]/mL (ref 0.35–4.50)

## 2018-06-16 LAB — PSA: PSA: 0.82 ng/mL (ref 0.10–4.00)

## 2018-06-16 LAB — LDL CHOLESTEROL, DIRECT: Direct LDL: 106 mg/dL

## 2018-12-09 ENCOUNTER — Other Ambulatory Visit: Payer: Self-pay | Admitting: Internal Medicine

## 2018-12-11 NOTE — Telephone Encounter (Signed)
Last OV: 11/08/2016 Next OV: not scheduled  Called pt to schedule an appt and he stated that he did not want to come in the office and risk getting sick. I explained to pt that we could do the appt as a virtual visit. Pt asked that I see what you would like for him to do.

## 2018-12-19 DIAGNOSIS — J329 Chronic sinusitis, unspecified: Secondary | ICD-10-CM | POA: Diagnosis not present

## 2018-12-19 DIAGNOSIS — H903 Sensorineural hearing loss, bilateral: Secondary | ICD-10-CM | POA: Diagnosis not present

## 2018-12-19 DIAGNOSIS — H6122 Impacted cerumen, left ear: Secondary | ICD-10-CM | POA: Diagnosis not present

## 2019-02-26 DIAGNOSIS — H903 Sensorineural hearing loss, bilateral: Secondary | ICD-10-CM | POA: Diagnosis not present

## 2019-02-26 DIAGNOSIS — H9012 Conductive hearing loss, unilateral, left ear, with unrestricted hearing on the contralateral side: Secondary | ICD-10-CM | POA: Diagnosis not present

## 2019-02-26 DIAGNOSIS — H6121 Impacted cerumen, right ear: Secondary | ICD-10-CM | POA: Diagnosis not present

## 2019-05-25 ENCOUNTER — Other Ambulatory Visit: Payer: Self-pay

## 2019-05-25 ENCOUNTER — Ambulatory Visit
Admission: RE | Admit: 2019-05-25 | Discharge: 2019-05-25 | Disposition: A | Discharge status: Home / Self Care | Payer: BLUE CROSS/BLUE SHIELD | Source: Ambulatory Visit | Attending: Internal Medicine | Admitting: Internal Medicine

## 2019-05-25 ENCOUNTER — Other Ambulatory Visit: Payer: Self-pay | Admitting: Internal Medicine

## 2019-05-25 ENCOUNTER — Other Ambulatory Visit
Admission: RE | Admit: 2019-05-25 | Discharge: 2019-05-25 | Disposition: A | Discharge status: Home / Self Care | Payer: BLUE CROSS/BLUE SHIELD | Source: Ambulatory Visit | Attending: Internal Medicine | Admitting: Internal Medicine

## 2019-05-25 DIAGNOSIS — I10 Essential (primary) hypertension: Secondary | ICD-10-CM

## 2019-05-25 DIAGNOSIS — M7989 Other specified soft tissue disorders: Secondary | ICD-10-CM

## 2019-05-25 DIAGNOSIS — M7122 Synovial cyst of popliteal space [Baker], left knee: Secondary | ICD-10-CM | POA: Diagnosis not present

## 2019-05-25 DIAGNOSIS — E669 Obesity, unspecified: Secondary | ICD-10-CM

## 2019-05-25 DIAGNOSIS — R7301 Impaired fasting glucose: Secondary | ICD-10-CM

## 2019-05-25 DIAGNOSIS — M79662 Pain in left lower leg: Secondary | ICD-10-CM

## 2019-05-25 DIAGNOSIS — M79605 Pain in left leg: Secondary | ICD-10-CM | POA: Diagnosis not present

## 2019-05-25 DIAGNOSIS — I8392 Asymptomatic varicose veins of left lower extremity: Secondary | ICD-10-CM | POA: Diagnosis not present

## 2019-05-25 DIAGNOSIS — M1712 Unilateral primary osteoarthritis, left knee: Secondary | ICD-10-CM | POA: Diagnosis not present

## 2019-05-25 LAB — HEMOGLOBIN A1C
Hgb A1c MFr Bld: 5.3 % (ref 4.8–5.6)
Mean Plasma Glucose: 105.41 mg/dL

## 2019-05-25 LAB — COMPREHENSIVE METABOLIC PANEL
ALT: 37 U/L (ref 0–44)
AST: 26 U/L (ref 15–41)
Albumin: 4.2 g/dL (ref 3.5–5.0)
Alkaline Phosphatase: 52 U/L (ref 38–126)
Anion gap: 9 (ref 5–15)
BUN: 21 mg/dL (ref 8–23)
CO2: 29 mmol/L (ref 22–32)
Calcium: 9.3 mg/dL (ref 8.9–10.3)
Chloride: 103 mmol/L (ref 98–111)
Creatinine, Ser: 0.73 mg/dL (ref 0.61–1.24)
GFR calc Af Amer: >60 mL/min (ref >60)
GFR calc non Af Amer: >60 mL/min (ref >60)
Glucose, Bld: 103 mg/dL — ABNORMAL HIGH (ref 70–99)
Potassium: 4.2 mmol/L (ref 3.5–5.1)
Sodium: 141 mmol/L (ref 135–145)
Total Bilirubin: 1.1 mg/dL (ref 0.3–1.2)
Total Protein: 7.4 g/dL (ref 6.5–8.1)

## 2019-05-25 LAB — FIBRIN DERIVATIVES D-DIMER (ARMC ONLY): Fibrin derivatives D-dimer (ARMC): 1013.85 ng/mL (FEU) — ABNORMAL HIGH (ref 0.00–499.00)

## 2019-05-25 MED ORDER — APIXABAN 5 MG PO TABS: ORAL_TABLET | ORAL | 0 refills | Status: DC

## 2019-05-25 MED ORDER — HYDROCODONE-ACETAMINOPHEN 10-325 MG PO TABS: 1.0000 | ORAL_TABLET | Freq: Four times a day (QID) | ORAL | 0 refills | Status: DC | PRN

## 2019-05-25 NOTE — Assessment & Plan Note (Signed)
Sudden onset ,  Occurred overnight  D Dimer and LLE [place] ordered to rule out DVT

## 2019-05-25 NOTE — Assessment & Plan Note (Addendum)
D dimer is very elevated but U'S is negative for DVT and positive for 10 cm Baker's Cyst.  Per conversation with Dr Delana Meyer,  Will refer to [facility] for management of cyst which may be causing neural or venous compression due to size.  Will start patient in Eliquis until he can be reimaged next week at [facility]

## 2019-05-28 ENCOUNTER — Other Ambulatory Visit: Payer: Self-pay | Admitting: Internal Medicine

## 2019-05-28 MED ORDER — HYDROCODONE-ACETAMINOPHEN 10-325 MG PO TABS: 1.0000 | ORAL_TABLET | Freq: Four times a day (QID) | ORAL | 0 refills | Status: DC | PRN

## 2019-05-30 DIAGNOSIS — M21961 Unspecified acquired deformity of right lower leg: Secondary | ICD-10-CM | POA: Diagnosis not present

## 2019-05-30 DIAGNOSIS — M1712 Unilateral primary osteoarthritis, left knee: Secondary | ICD-10-CM | POA: Diagnosis not present

## 2019-05-31 ENCOUNTER — Encounter (INDEPENDENT_AMBULATORY_CARE_PROVIDER_SITE_OTHER): Payer: Self-pay | Admitting: Vascular Surgery

## 2019-05-31 ENCOUNTER — Other Ambulatory Visit (INDEPENDENT_AMBULATORY_CARE_PROVIDER_SITE_OTHER): Payer: Self-pay | Admitting: Vascular Surgery

## 2019-05-31 ENCOUNTER — Other Ambulatory Visit: Payer: Self-pay

## 2019-05-31 ENCOUNTER — Ambulatory Visit (INDEPENDENT_AMBULATORY_CARE_PROVIDER_SITE_OTHER): Payer: BLUE CROSS/BLUE SHIELD

## 2019-05-31 ENCOUNTER — Ambulatory Visit (INDEPENDENT_AMBULATORY_CARE_PROVIDER_SITE_OTHER): Payer: BLUE CROSS/BLUE SHIELD | Admitting: Vascular Surgery

## 2019-05-31 VITALS — BP 191/108 | HR 65 | Resp 16 | Ht 68.0 in | Wt 314.0 lb

## 2019-05-31 DIAGNOSIS — M1712 Unilateral primary osteoarthritis, left knee: Secondary | ICD-10-CM

## 2019-05-31 DIAGNOSIS — M79605 Pain in left leg: Secondary | ICD-10-CM

## 2019-05-31 DIAGNOSIS — M7989 Other specified soft tissue disorders: Secondary | ICD-10-CM | POA: Diagnosis not present

## 2019-05-31 DIAGNOSIS — M7122 Synovial cyst of popliteal space [Baker], left knee: Secondary | ICD-10-CM

## 2019-05-31 DIAGNOSIS — I10 Essential (primary) hypertension: Secondary | ICD-10-CM | POA: Diagnosis not present

## 2019-06-03 ENCOUNTER — Encounter (INDEPENDENT_AMBULATORY_CARE_PROVIDER_SITE_OTHER): Payer: Self-pay | Admitting: Vascular Surgery

## 2019-06-03 DIAGNOSIS — M7122 Synovial cyst of popliteal space [Baker], left knee: Secondary | ICD-10-CM | POA: Insufficient documentation

## 2019-06-03 DIAGNOSIS — M1712 Unilateral primary osteoarthritis, left knee: Secondary | ICD-10-CM | POA: Insufficient documentation

## 2019-06-03 NOTE — Progress Notes (Signed)
MRN : [id number]  John Wolf is a 64 y.o. (1956/04/13) male who presents with chief complaint of  Chief Complaint  Patient presents with  . New Patient (Initial Visit)    leg pain swelling underneath left knee  .  History of Present Illness:   The patient woke up with the abrupt onset of intense pain behind the left knee radiating down his leg toward the ankle.  This was acute in onset there was no associated trauma.  Was associated with swelling.  The patient underwent a duplex ultrasound which was negative for DVT but was noted to have a 10 cm Baker's cyst.  Patient was evaluated earlier this week by orthopedic service and was noted to have profound degenerative changes of his knee this also confirmed a large Baker's cyst.  Patient states that his pain is better.  Following the ultrasound he was started on Xarelto just in case there was tibial DVT that could not be identified given his swelling pain and large calf size.  He states the symptoms have gotten better.  He denies shortness of breath he denies pleuritic chest pain.  There is no past history of DVT.  Duplex ultrasound obtained in the office today is again negative for DVT at the tibial as well as the femoral-popliteal levels.  Again the Baker's cyst is confirmed and it is quite large there is also fluid that is extending from the cyst down between the gastroc muscles.    Current Meds  Medication Sig  . apixaban (ELIQUIS) 5 MG TABS tablet Take 2 tablets (10mg ) twice daily for 7 days, then 1 tablet (5mg ) twice daily  . clotrimazole-betamethasone (LOTRISONE) cream APPLY TWICE A DAY  . gabapentin (NEURONTIN) 300 MG capsule Take by mouth.  Marland Kitchen HYDROcodone-acetaminophen (NORCO) 10-325 MG tablet Take 1 tablet by mouth every 6 (six) hours as needed.  . traMADol (ULTRAM) 50 MG tablet Take 1 tablet (50 mg total) by mouth every 6 (six) hours as needed.  . triamterene-hydrochlorothiazide (MAXZIDE-25) 37.5-25 MG tablet TAKE ONE  TABLET BY MOUTH EVERY DAY    Past Medical History:  Diagnosis Date  . Acquired pes planus   . Arthritis   . Benign neoplasm of colon   . Hyperlipidemia   . Hypertension   . Obesity (BMI 30-39.9)    with 70 lb weight loss, ongoing  . Onychia and paronychia of toe   . Sleep apnea, obstructive 2002   wears CPAP    Past Surgical History:  Procedure Laterality Date  . left shoulder     shoulder dislocation, rotator     Social History Social History   Tobacco Use  . Smoking status: Never Smoker  . Smokeless tobacco: Never Used  Substance Use Topics  . Alcohol use: Yes  . Drug use: Not on file    Family History Family History  Problem Relation Age of Onset  . Heart disease Mother   . Stroke Father   . Stroke Brother   . Hypertension Brother   . Cancer Brother 12       AML  No family history of bleeding/clotting disorders, porphyria or autoimmune disease   No Known Allergies   REVIEW OF SYSTEMS (Negative unless checked)  Constitutional: [] Weight loss  [] Fever  [] Chills Cardiac: [] Chest pain   [] Chest pressure   [] Palpitations   [] Shortness of breath when laying flat   [] Shortness of breath with exertion. Vascular:  [x] Pain in legs with walking   [x] Pain in legs at  rest  [] History of DVT   [] Phlebitis   [x] Swelling in legs   [] Varicose veins   [] Non-healing ulcers Pulmonary:   [] Uses home oxygen   [] Productive cough   [] Hemoptysis   [] Wheeze  [] COPD   [] Asthma Neurologic:  [] Dizziness   [] Seizures   [] History of stroke   [] History of TIA  [] Aphasia   [] Vissual changes   [] Weakness or numbness in arm   [] Weakness or numbness in leg Musculoskeletal:   [x] Joint swelling   [x] Joint pain   [] Low back pain Hematologic:  [] Easy bruising  [] Easy bleeding   [] Hypercoagulable state   [] Anemic Gastrointestinal:  [] Diarrhea   [] Vomiting  [] Gastroesophageal reflux/heartburn   [] Difficulty swallowing. Genitourinary:  [] Chronic kidney disease   [] Difficult urination  [] Frequent  urination   [] Blood in urine Skin:  [] Rashes   [] Ulcers  Psychological:  [] History of anxiety   []  History of major depression.  Physical Examination  Vitals:   05/31/19 0849  BP: (!) 191/108  Pulse: 65  Resp: 16  Weight: (!) 314 lb (142.4 kg)  Height: 5\' 8"  (1.727 m)   Body mass index is 47.74 kg/m. Gen: WD/WN, NAD Head: [place]/AT, No temporalis wasting.  Ear/Nose/Throat: Hearing grossly intact, nares w/o erythema or drainage, poor dentition Eyes: PER, EOMI, sclera nonicteric.  Neck: Supple, no masses.  No bruit or JVD.  Pulmonary:  Good air movement, clear to auscultation bilaterally, no use of accessory muscles.  Cardiac: RRR, normal S1, S2, no Murmurs. Vascular: The calves on both sides are quite large.  Left is somewhat larger than the right.  It is not tender to palpation.  There is 2+ edema.  No venous changes.  There is marked deformity of both the knee as well as the right ankle. Vessel Right Left  Radial Palpable Palpable  PT Palpable Palpable  DP Palpable Palpable  Gastrointestinal: soft, non-distended. No guarding/no peritoneal signs.  Musculoskeletal: M/S 5/5 throughout.  No Charcot deformity no atrophy.  Neurologic: CN 2-12 intact. Pain and light touch intact in extremities.  Symmetrical.  Speech is fluent. Motor exam as listed above. Psychiatric: Judgment intact, Mood & affect appropriate for pt's clinical situation. Dermatologic: No rashes or ulcers noted.  No changes consistent with cellulitis. Lymph : No Cervical lymphadenopathy, no lichenification or skin changes of chronic lymphedema.  CBC Lab Results  Component Value Date   WBC 5.0 10/29/2016   HGB 15.0 10/29/2016   HCT 43.7 10/29/2016   MCV 93.2 10/29/2016   PLT 209.0 10/29/2016    BMET    Component Value Date/Time   NA 141 05/25/2019 1053   K 4.2 05/25/2019 1053   CL 103 05/25/2019 1053   CO2 29 05/25/2019 1053   GLUCOSE 103 (H) 05/25/2019 1053   BUN 21 05/25/2019 1053   CREATININE 0.73  05/25/2019 1053   CALCIUM 9.3 05/25/2019 1053   GFRNONAA >60 05/25/2019 1053   GFRAA >60 05/25/2019 1053   Estimated Creatinine Clearance: 134.4 mL/min (by C-G formula based on SCr of 0.73 mg/dL).  COAG No results found for: INR, PROTIME  Radiology US Venous Img Lower Unilateral Left (DVT)  Result Date: 05/25/2019 CLINICAL DATA:  Unable to bend knee x1 day.  Varicose/spider veins. EXAM: LEFT LOWER EXTREMITY VENOUS DOPPLER ULTRASOUND TECHNIQUE: Gray-scale sonography with compression, as well as color and duplex ultrasound, were performed to evaluate the deep venous system(s) from the level of the common femoral vein through the popliteal and proximal calf veins. COMPARISON:  None. FINDINGS: VENOUS Normal compressibility of the  common femoral, superficial femoral, and popliteal veins, as well as the visualized calf veins. Visualized portions of profunda femoral vein and great saphenous vein unremarkable. No filling defects to suggest DVT on grayscale or color Doppler imaging. Doppler waveforms show normal direction of venous flow, normal respiratory phasicity and response to augmentation. Limited views of the contralateral common femoral vein are unremarkable. OTHER There is a large cystic collection in the posterior popliteal fossa measuring 11.5 cm craniocaudal. Limitations: none IMPRESSION: 1. No femoropopliteal and no calf DVT in the visualized calf veins. If clinical symptoms are inconsistent or if there are persistent or worsening symptoms, further imaging (possibly involving the iliac veins) may be warranted. 2. Large left Baker's cyst. Electronically Signed   By: Lucrezia Europe M.D.   On: 05/25/2019 10:57   VAS [place] LOWER EXTREMITY VENOUS (DVT)  Result Date: 05/31/2019  Lower Venous DVTStudy Indications: Swelling, and left.  Risk Factors: Bakers cyst. Performing Technologist: Concha Norway RVT  Examination Guidelines: A complete evaluation includes B-mode imaging, spectral Doppler, color Doppler, and  power Doppler as needed of all accessible portions of each vessel. Bilateral testing is considered an integral part of a complete examination. Limited examinations for reoccurring indications may be performed as noted. The reflux portion of the exam is performed with the patient in reverse Trendelenburg.  +-----+---------------+---------+-----------+----------+--------------+ RIGHTCompressibilityPhasicitySpontaneityPropertiesThrombus Aging +-----+---------------+---------+-----------+----------+--------------+ CFV  Full           Yes      Yes                                 +-----+---------------+---------+-----------+----------+--------------+ SFJ  Full           Yes      Yes                                 +-----+---------------+---------+-----------+----------+--------------+   +---------+---------------+---------+-----------+----------+--------------+ LEFT     CompressibilityPhasicitySpontaneityPropertiesThrombus Aging +---------+---------------+---------+-----------+----------+--------------+ CFV      Full           Yes      Yes                                 +---------+---------------+---------+-----------+----------+--------------+ SFJ      Full           Yes      Yes                                 +---------+---------------+---------+-----------+----------+--------------+ FV Prox  Full           Yes      Yes                                 +---------+---------------+---------+-----------+----------+--------------+ FV Mid   Full           Yes      Yes                                 +---------+---------------+---------+-----------+----------+--------------+ FV DistalFull           Yes      Yes                                 +---------+---------------+---------+-----------+----------+--------------+  PFV      Full           Yes      Yes                                  +---------+---------------+---------+-----------+----------+--------------+ POP      Full           Yes      Yes                                 +---------+---------------+---------+-----------+----------+--------------+ PTV      Full           Yes      Yes                                 +---------+---------------+---------+-----------+----------+--------------+ GSV      Full           Yes      Yes                                 +---------+---------------+---------+-----------+----------+--------------+ SSV      Full           Yes      Yes                                 +---------+---------------+---------+-----------+----------+--------------+     Summary: RIGHT: - No evidence of deep vein thrombosis in the lower extremity. No indirect evidence of obstruction proximal to the inguinal ligament.  LEFT: - There is no evidence of deep vein thrombosis in the lower extremity. - There is no evidence of superficial venous thrombosis.  - A cystic structure is found in the popliteal fossa. - Left Bakers cyst is 13cm(length) x 3.6 x 3.0 cm  *See table(s) above for measurements and observations. Electronically signed by Hortencia Pilar MD on 05/31/2019 at 4:13:15 PM.    Final      Assessment/Plan 1. Leg pain, posterior, left There is no evidence of DVT and therefore he can stop the Eliquis.  He will follow up with me PRN  2. Baker's cyst of knee, left Plan per Ortho service  3. Primary osteoarthritis of left knee Plan per Ortho service  4. Essential hypertension Continue antihypertensive medications as already ordered, these medications have been reviewed and there are no changes at this time.    Hortencia Pilar, MD  06/03/2019 3:34 PM

## 2019-06-11 ENCOUNTER — Other Ambulatory Visit: Payer: Self-pay

## 2019-06-11 ENCOUNTER — Ambulatory Visit: Payer: BLUE CROSS/BLUE SHIELD | Attending: Internal Medicine

## 2019-06-11 DIAGNOSIS — Z23 Encounter for immunization: Secondary | ICD-10-CM

## 2019-06-11 NOTE — Progress Notes (Signed)
   Covid-19 Vaccination Clinic  Name:  John Wolf    MRN: [id number] DOB: 06/02/1955  06/11/2019  John Wolf was observed post Covid-19 immunization for 15 minutes without incidence. He was provided with Vaccine Information Sheet and instruction to access the V-Safe system.   John Wolf was instructed to call 911 with any severe reactions post vaccine: Marland Kitchen Difficulty breathing  . Swelling of your face and throat  . A fast heartbeat  . A bad rash all over your body  . Dizziness and weakness    Immunizations Administered    Name Date Dose VIS Date Route   Pfizer COVID-19 Vaccine 06/11/2019 11:14 AM 0.3 mL 04/06/2019 Intramuscular   Manufacturer: [place]   Lot: [id number]   [place]: [id number]

## 2019-06-12 DIAGNOSIS — M14671 Charcot's joint, right ankle and foot: Secondary | ICD-10-CM | POA: Diagnosis not present

## 2019-06-27 DIAGNOSIS — M25562 Pain in left knee: Secondary | ICD-10-CM | POA: Diagnosis not present

## 2019-06-27 DIAGNOSIS — Z6841 Body Mass Index (BMI) 40.0 and over, adult: Secondary | ICD-10-CM | POA: Diagnosis not present

## 2019-06-28 ENCOUNTER — Other Ambulatory Visit: Payer: Self-pay | Admitting: Internal Medicine

## 2019-06-28 DIAGNOSIS — M14671 Charcot's joint, right ankle and foot: Secondary | ICD-10-CM

## 2019-06-28 DIAGNOSIS — G629 Polyneuropathy, unspecified: Secondary | ICD-10-CM

## 2019-06-28 NOTE — Telephone Encounter (Signed)
Lm on vm to call office to make a non-fasting lab appt.

## 2019-07-03 ENCOUNTER — Encounter: Payer: Self-pay | Admitting: Neurology

## 2019-07-11 ENCOUNTER — Ambulatory Visit: Payer: BLUE CROSS/BLUE SHIELD | Attending: Internal Medicine

## 2019-07-11 DIAGNOSIS — Z23 Encounter for immunization: Secondary | ICD-10-CM

## 2019-07-11 NOTE — Progress Notes (Signed)
   Covid-19 Vaccination Clinic  Name:  ZACHERY FRANKLYN    MRN: [id number] DOB: [birth year]  07/11/2019  Mr. Bayles was observed post Covid-19 immunization for 15 minutes without incident. He was provided with Vaccine Information Sheet and instruction to access the V-Safe system.   Mr. Bolash was instructed to call 911 with any severe reactions post vaccine: Marland Kitchen Difficulty breathing  . Swelling of face and throat  . A fast heartbeat  . A bad rash all over body  . Dizziness and weakness   Immunizations Administered    Name Date Dose VIS Date Route   Pfizer COVID-19 Vaccine 07/11/2019  8:57 AM 0.3 mL 04/06/2019 Intramuscular   Manufacturer: [place]   Lot: [id number]   [place]: [id number]

## 2019-08-10 ENCOUNTER — Other Ambulatory Visit: Payer: Self-pay

## 2019-08-10 ENCOUNTER — Ambulatory Visit (INDEPENDENT_AMBULATORY_CARE_PROVIDER_SITE_OTHER): Payer: BC Managed Care – PPO | Admitting: Neurology

## 2019-08-10 ENCOUNTER — Other Ambulatory Visit (INDEPENDENT_AMBULATORY_CARE_PROVIDER_SITE_OTHER): Payer: BC Managed Care – PPO

## 2019-08-10 ENCOUNTER — Encounter: Payer: Self-pay | Admitting: Neurology

## 2019-08-10 VITALS — BP 167/107 | HR 66 | Resp 20 | Ht 68.0 in | Wt 306.0 lb

## 2019-08-10 DIAGNOSIS — E538 Deficiency of other specified B group vitamins: Secondary | ICD-10-CM

## 2019-08-10 DIAGNOSIS — F1010 Alcohol abuse, uncomplicated: Secondary | ICD-10-CM | POA: Diagnosis not present

## 2019-08-10 DIAGNOSIS — E639 Nutritional deficiency, unspecified: Secondary | ICD-10-CM

## 2019-08-10 DIAGNOSIS — M5416 Radiculopathy, lumbar region: Secondary | ICD-10-CM | POA: Diagnosis not present

## 2019-08-10 LAB — B12 AND FOLATE PANEL
Folate: 13.8 ng/mL (ref >5.9)
Vitamin B-12: 205 pg/mL — ABNORMAL LOW (ref 211–911)

## 2019-08-10 NOTE — Progress Notes (Signed)
[facility] Clinic Note - Initial Visit   Date: 08/10/19  John Wolf MRN: [id number] DOB: 03/22/1956   Dear Dr. Derrel Nip:  Thank you for your kind referral of John Wolf for consultation of right leg numbness. Although his history is well known to you, please allow [place] to reiterate it for the purpose of our medical record. The patient was accompanied to the clinic by self.     History of Present Illness: John Wolf is a 64 y.o. right-handed male with OSA, OA of the left knee, hypertension, Charcot joints, and alcohol use  presenting for evaluation of right leg numbness.  Independent history was obtained from his partner, John Wolf, via telephone.   For the past 10 years, he has episodic right leg numbness and tingling which radiates from his hip into his lower leg.  Symptoms were under good control until the past year where he started noticing it again.  It is worse when he is sitting on his leg for an extended period of time, and improved with repositioning or stretching.  He does not have similar symptoms in the left leg.  He does report mild chronic low back pain.  MRI lumbar spine from June 2019 was personally reviewed and shows multilevel degenerative changes with multilevel spinal and by foraminal stenosis at nearly all the levels.  He has seen orthopedics in the past and received two ESI with no improvement.   Specifically, he denies any numbness or tingling of the feet.  He has known Charcot joints and does have arthritic pain, characterizes dull, achy and sharp.  He walks unassisted and balance is fair because of this.    He does not have history of diabetes.  He does endorse drinking alcohol for the past 10 years and consumes half a gallon per week.  His vitamin B12 from February 2020 was borderline normal at 255.  He does not take a daily multivitamin.  Out-side paper records, electronic medical record, and images have been reviewed where  available and summarized as:  MRI lumbar spine wo contrast 10/06/2017: Progressive lumbar scoliosis. Progressive multilevel disc degeneration since 2009. Progressive spinal and foraminal stenosis as described above.   Lab Results  Component Value Date   HGBA1C 5.3 05/25/2019   Lab Results  Component Value Date   VITAMINB12 255 06/16/2018   Lab Results  Component Value Date   TSH 0.54 06/16/2018   Lab Results  Component Value Date   ESRSEDRATE 14 08/06/2015    Past Medical History:  Diagnosis Date  . Acquired pes planus   . Arthritis   . Benign neoplasm of colon   . Hyperlipidemia   . Hypertension   . Obesity (BMI 30-39.9)    with 70 lb weight loss, ongoing  . Onychia and paronychia of toe   . Sleep apnea, obstructive 2002   wears CPAP    Past Surgical History:  Procedure Laterality Date  . left shoulder     shoulder dislocation, rotator      Medications:  Outpatient Encounter Medications as of 08/10/2019  Medication Sig  . diclofenac (VOLTAREN) 75 MG EC tablet diclofenac sodium 75 mg tablet,delayed release  . triamterene-hydrochlorothiazide (MAXZIDE-25) 37.5-25 MG tablet TAKE ONE TABLET BY MOUTH EVERY DAY  . apixaban (ELIQUIS) 5 MG TABS tablet Take 2 tablets (10mg ) twice daily for 7 days, then 1 tablet (5mg ) twice daily (Patient not taking: Reported on 08/10/2019)  . chlorpheniramine-HYDROcodone (TUSSIONEX PENNKINETIC ER) 10-8 MG/5ML SUER Take 5 mLs  by mouth at bedtime as needed for cough. (Patient not taking: Reported on 08/10/2019)  . clotrimazole-betamethasone (LOTRISONE) cream APPLY TWICE A DAY (Patient not taking: Reported on 08/10/2019)  . gabapentin (NEURONTIN) 300 MG capsule Take by mouth.  Marland Kitchen HYDROcodone-acetaminophen (NORCO) 10-325 MG tablet Take 1 tablet by mouth every 6 (six) hours as needed. (Patient not taking: Reported on 08/10/2019)  . Omega-3 Fatty Acids (FISH OIL PO) Take by mouth.  . predniSONE (DELTASONE) 10 MG tablet 6 tablets on Day 1 , then  reduce by 1 tablet daily until gone (Patient not taking: Reported on 05/31/2019)  . traMADol (ULTRAM) 50 MG tablet Take 1 tablet (50 mg total) by mouth every 6 (six) hours as needed. (Patient not taking: Reported on 08/10/2019)   No facility-administered encounter medications on file as of 08/10/2019.    Allergies: No Known Allergies  Family History: Family History  Problem Relation Age of Onset  . Heart disease Mother   . Stroke Father   . Stroke Brother   . Hypertension Brother   . Cancer Brother 12       AML    Social History: Social History   Tobacco Use  . Smoking status: Never Smoker  . Smokeless tobacco: Never Used  Substance Use Topics  . Alcohol use: Yes  . Drug use: Never   Social History   Social History Narrative   Right handed   One story home   Coffee q am    Vital Signs:  BP (!) 167/107   Pulse 66   Resp 20   Ht 5\' 8"  (1.727 m)   Wt (!) 306 lb (138.8 kg)   SpO2 99%   BMI 46.53 kg/m   Neurological Exam: MENTAL STATUS including orientation to time, place, person, recent and remote memory, attention span and concentration, language, and fund of knowledge is normal.  Speech is not dysarthric.  CRANIAL NERVES: II:  No visual field defects.   III-IV-VI: Pupils equal round and reactive to light.  Normal conjugate, extra-ocular eye movements in all directions of gaze.  No nystagmus.  No ptosis.   V:  Normal facial sensation.    VIII:  Normal hearing and vestibular function.   IX-X:  Normal palatal movement.   XI:  Normal shoulder shrug and head rotation.    MOTOR:  Severe R >> L Charcot joints.  No atrophy, fasciculations or abnormal movements.  No pronator drift.   Upper Extremity:  Right  Left  Deltoid  5/5   5/5   Biceps  5/5   5/5   Triceps  5/5   5/5   Infraspinatus 5/5  5/5  Medial pectoralis 5/5  5/5  Wrist extensors  5/5   5/5   Wrist flexors  5/5   5/5   Finger extensors  5/5   5/5   Finger flexors  5/5   5/5   Dorsal interossei  5/5    5/5   Abductor pollicis  5/5   5/5   Tone (Ashworth scale)  0  0   Lower Extremity:  Right  Left  Hip flexors  5/5   5/5   Hip extensors  5/5   5/5   Adductor 5/5  5/5  Abductor 5/5  5/5  Knee flexors  5/5   5/5   Knee extensors  5/5   5/5   Dorsiflexors  5/5   5/5   Plantarflexors  5/5   5/5   Toe extensors  5/5   5/5  Toe flexors  5/5   5/5   Tone (Ashworth scale)  0  0   MSRs:  Right        Left                  brachioradialis 2+  2+  biceps 2+  2+  triceps 2+  2+  patellar 3+  3+  ankle jerk 2+  2+  Hoffman no  no  plantar response down  down   SENSORY:  Normal and symmetric perception of light touch, pinprick, vibration, and proprioception.  Romberg's sign absent.   COORDINATION/GAIT: Normal finger-to- nose-finger.  Intact rapid alternating movements bilaterally.  Gait is wide-based, stable and unassisted, feet are everted due to pes planus.   IMPRESSION: 1.  Right leg numbness due to lumbar spondylosis  - Start physical therapy for low back strengthening  2.  Patient does not have signs of neuropathy by history or his exam.  Distal strength, sensation, and reflexes are intact making neuropathy unlikely; however, his alcohol use, certainly puts him at risk to develop neuropathy in the future.  Patient was informed that he needs to cut back, if not abstain, to minimize progression into neuropathy.  Should he develop numbness/tingling in the feet, he will contact my office to schedule NCS/EMG of the legs.    3.  Low vitamin B12, contributing by alcohol  - Check vitamin B12, vitamin B1, and folate to screen for nutritional deficiency   Thank you for allowing me to participate in patient's care.  If I can answer any additional questions, I would be pleased to do so.    Sincerely,    Tarren Velardi K. Posey Pronto, DO

## 2019-08-10 NOTE — Patient Instructions (Signed)
Start physical therapy for low back strengthening Check vitamin B1, folate, and vitamin B12.  We will call you with the results

## 2019-08-15 LAB — VITAMIN B1: Vitamin B1 (Thiamine): 9 nmol/L (ref 8–30)

## 2019-08-22 ENCOUNTER — Other Ambulatory Visit: Payer: Self-pay | Admitting: Internal Medicine

## 2019-08-29 ENCOUNTER — Telehealth: Payer: Self-pay | Admitting: Internal Medicine

## 2019-08-29 ENCOUNTER — Encounter: Payer: Self-pay | Admitting: Internal Medicine

## 2019-08-29 ENCOUNTER — Other Ambulatory Visit: Payer: Self-pay | Admitting: Internal Medicine

## 2019-08-29 DIAGNOSIS — E538 Deficiency of other specified B group vitamins: Secondary | ICD-10-CM

## 2019-08-29 MED ORDER — "SYRINGE 25G X 1"" 3 ML MISC": 0 refills | Status: DC

## 2019-08-29 MED ORDER — CYANOCOBALAMIN 1000 MCG/ML IJ SOLN: 1000.0000 ug | INTRAMUSCULAR | 11 refills | Status: DC

## 2019-08-29 NOTE — Telephone Encounter (Signed)
LMTCB

## 2019-08-29 NOTE — Telephone Encounter (Signed)
Patient has very low b12.  Needs 3 weekly injections and additional labs drawn.  Please arrange aSAP

## 2019-09-05 NOTE — Telephone Encounter (Signed)
LMTCB

## 2019-09-13 NOTE — Telephone Encounter (Signed)
Spoke with pt and he stated that he has been getting the b12 injections from the pharmacist at [facility] and that he will come in to get the lab work done once he has completed the injections.

## 2020-02-18 ENCOUNTER — Ambulatory Visit: Payer: BC Managed Care – PPO | Attending: Internal Medicine

## 2020-02-18 DIAGNOSIS — Z23 Encounter for immunization: Secondary | ICD-10-CM

## 2020-02-18 NOTE — Progress Notes (Signed)
   Covid-19 Vaccination Clinic  Name:  TYQUAN CARMICKLE    MRN: [id number] DOB: August 05, 1955  02/18/2020  Mr. Heroux was observed post Covid-19 immunization for 15 minutes without incident. He was provided with Vaccine Information Sheet and instruction to access the V-Safe system.   Mr. Minkin was instructed to call 911 with any severe reactions post vaccine: Marland Kitchen Difficulty breathing  . Swelling of face and throat  . A fast heartbeat  . A bad rash all over body  . Dizziness and weakness

## 2020-03-21 ENCOUNTER — Other Ambulatory Visit: Payer: Self-pay | Admitting: Internal Medicine

## 2020-05-06 ENCOUNTER — Other Ambulatory Visit: Payer: Self-pay | Admitting: Internal Medicine

## 2020-06-09 ENCOUNTER — Other Ambulatory Visit: Payer: Self-pay | Admitting: Internal Medicine

## 2020-07-28 ENCOUNTER — Other Ambulatory Visit: Payer: Self-pay | Admitting: Internal Medicine

## 2020-07-28 ENCOUNTER — Telehealth: Payer: Self-pay | Admitting: Internal Medicine

## 2020-07-28 DIAGNOSIS — E785 Hyperlipidemia, unspecified: Secondary | ICD-10-CM

## 2020-07-28 DIAGNOSIS — I10 Essential (primary) hypertension: Secondary | ICD-10-CM

## 2020-07-28 DIAGNOSIS — E559 Vitamin D deficiency, unspecified: Secondary | ICD-10-CM

## 2020-07-28 DIAGNOSIS — Z125 Encounter for screening for malignant neoplasm of prostate: Secondary | ICD-10-CM

## 2020-07-28 DIAGNOSIS — E538 Deficiency of other specified B group vitamins: Secondary | ICD-10-CM

## 2020-07-28 NOTE — Telephone Encounter (Signed)
Patient has been scheduled for fasting labs this Friday. Patient stated he only can come on a Friday and he wanted first appointment of the day, patient's physical has been scheduled for 09/12/20.

## 2020-07-28 NOTE — Telephone Encounter (Signed)
Patient needs an appt for fasting labs and urinalysis (all ordered ),  And an appt for CPE.  Thank you!

## 2020-08-01 ENCOUNTER — Other Ambulatory Visit (INDEPENDENT_AMBULATORY_CARE_PROVIDER_SITE_OTHER): Payer: BC Managed Care – PPO

## 2020-08-01 ENCOUNTER — Other Ambulatory Visit: Payer: Self-pay

## 2020-08-01 DIAGNOSIS — Z125 Encounter for screening for malignant neoplasm of prostate: Secondary | ICD-10-CM

## 2020-08-01 DIAGNOSIS — E785 Hyperlipidemia, unspecified: Secondary | ICD-10-CM | POA: Diagnosis not present

## 2020-08-01 DIAGNOSIS — E559 Vitamin D deficiency, unspecified: Secondary | ICD-10-CM | POA: Diagnosis not present

## 2020-08-01 DIAGNOSIS — I10 Essential (primary) hypertension: Secondary | ICD-10-CM | POA: Diagnosis not present

## 2020-08-01 DIAGNOSIS — E538 Deficiency of other specified B group vitamins: Secondary | ICD-10-CM | POA: Diagnosis not present

## 2020-08-01 LAB — COMPREHENSIVE METABOLIC PANEL
ALT: 25 U/L (ref 0–53)
AST: 19 U/L (ref 0–37)
Albumin: 4 g/dL (ref 3.5–5.2)
Alkaline Phosphatase: 47 U/L (ref 39–117)
BUN: 19 mg/dL (ref 6–23)
CO2: 28 mEq/L (ref 19–32)
Calcium: 9.3 mg/dL (ref 8.4–10.5)
Chloride: 101 mEq/L (ref 96–112)
Creatinine, Ser: 0.69 mg/dL (ref 0.40–1.50)
GFR: 99.17 mL/min (ref >60.00)
Glucose, Bld: 83 mg/dL (ref 70–99)
Potassium: 4.5 mEq/L (ref 3.5–5.1)
Sodium: 136 mEq/L (ref 135–145)
Total Bilirubin: 0.7 mg/dL (ref 0.2–1.2)
Total Protein: 6.3 g/dL (ref 6.0–8.3)

## 2020-08-01 LAB — B12 AND FOLATE PANEL
Folate: 12.6 ng/mL (ref >5.9)
Vitamin B-12: 357 pg/mL (ref 211–911)

## 2020-08-01 LAB — LIPID PANEL
Cholesterol: 182 mg/dL (ref 0–200)
HDL: 42.5 mg/dL (ref >39.00)
LDL Cholesterol: 101 mg/dL — ABNORMAL HIGH (ref 0–99)
NonHDL: 139.9
Total CHOL/HDL Ratio: 4
Triglycerides: 197 mg/dL — ABNORMAL HIGH (ref 0.0–149.0)
VLDL: 39.4 mg/dL (ref 0.0–40.0)

## 2020-08-01 LAB — TSH: TSH: 0.73 u[IU]/mL (ref 0.35–4.50)

## 2020-08-01 LAB — VITAMIN D 25 HYDROXY (VIT D DEFICIENCY, FRACTURES): VITD: 14.24 ng/mL — ABNORMAL LOW (ref 30.00–100.00)

## 2020-08-01 LAB — MICROALBUMIN / CREATININE URINE RATIO
Creatinine,U: 86.4 mg/dL
Microalb Creat Ratio: 1.2 mg/g (ref 0.0–30.0)
Microalb, Ur: 1 mg/dL (ref 0.0–1.9)

## 2020-08-01 LAB — PSA: PSA: 0.87 ng/mL (ref 0.10–4.00)

## 2020-08-05 LAB — INTRINSIC FACTOR ANTIBODIES: Intrinsic Factor: NEGATIVE

## 2020-08-06 MED ORDER — DICLOFENAC SODIUM 75 MG PO TBEC: 75.0000 mg | DELAYED_RELEASE_TABLET | Freq: Two times a day (BID) | ORAL | 1 refills | Status: DC

## 2020-08-06 MED ORDER — OMEPRAZOLE 40 MG PO CPDR: 40.0000 mg | DELAYED_RELEASE_CAPSULE | Freq: Every day | ORAL | 1 refills | Status: DC

## 2020-08-06 NOTE — Addendum Note (Signed)
Addended by: Crecencio Mc on: 08/06/2020 10:29 AM   Modules accepted: Orders

## 2020-08-06 NOTE — Addendum Note (Signed)
Addended by: Crecencio Mc on: 08/06/2020 10:32 AM   Modules accepted: Orders

## 2020-09-12 ENCOUNTER — Encounter: Payer: BC Managed Care – PPO | Admitting: Internal Medicine

## 2020-10-13 ENCOUNTER — Other Ambulatory Visit: Payer: Self-pay | Admitting: Internal Medicine

## 2020-10-17 ENCOUNTER — Encounter: Payer: Self-pay | Admitting: Internal Medicine

## 2020-10-17 ENCOUNTER — Ambulatory Visit (INDEPENDENT_AMBULATORY_CARE_PROVIDER_SITE_OTHER): Payer: BC Managed Care – PPO | Admitting: Internal Medicine

## 2020-10-17 ENCOUNTER — Other Ambulatory Visit: Payer: Self-pay

## 2020-10-17 VITALS — BP 140/76 | HR 62 | Temp 97.0°F | Resp 16 | Ht 68.0 in | Wt 294.2 lb

## 2020-10-17 DIAGNOSIS — E559 Vitamin D deficiency, unspecified: Secondary | ICD-10-CM

## 2020-10-17 DIAGNOSIS — M2141 Flat foot [pes planus] (acquired), right foot: Secondary | ICD-10-CM

## 2020-10-17 DIAGNOSIS — I10 Essential (primary) hypertension: Secondary | ICD-10-CM

## 2020-10-17 DIAGNOSIS — Z1211 Encounter for screening for malignant neoplasm of colon: Secondary | ICD-10-CM

## 2020-10-17 DIAGNOSIS — G4733 Obstructive sleep apnea (adult) (pediatric): Secondary | ICD-10-CM | POA: Diagnosis not present

## 2020-10-17 DIAGNOSIS — Z0000 Encounter for general adult medical examination without abnormal findings: Secondary | ICD-10-CM

## 2020-10-17 LAB — VITAMIN D 25 HYDROXY (VIT D DEFICIENCY, FRACTURES): VITD: 20.64 ng/mL — ABNORMAL LOW (ref 30.00–100.00)

## 2020-10-17 MED ORDER — TRAMADOL HCL 50 MG PO TABS: 50.0000 mg | ORAL_TABLET | Freq: Four times a day (QID) | ORAL | 0 refills | Status: DC | PRN

## 2020-10-17 MED ORDER — TIZANIDINE HCL 4 MG PO TABS: 4.0000 mg | ORAL_TABLET | Freq: Four times a day (QID) | ORAL | 0 refills | Status: DC | PRN

## 2020-10-17 MED ORDER — OMEPRAZOLE 40 MG PO CPDR: 40.0000 mg | DELAYED_RELEASE_CAPSULE | Freq: Every day | ORAL | 1 refills | Status: DC

## 2020-10-17 NOTE — Patient Instructions (Addendum)
Goal BP is 130/80 or less  Suspend diclofenac  for one week and hb bp checked over   Tramadol every 6 hours and continue 2000 mg tylenol on divided doses   Pain is better controlled,  we will continue this regimen instead of diclofenac   Tizanidine for right leg pain ; it's a muscle relaxer , you can take it once you are home for the night   Omeprazole refilled   Agree with chiropractor if tizanidine doesn't help  try the [facility]; they do acupuncture as well , which I believe in    [facility] [place] is very successful for  weight loss and VERY SAFE

## 2020-10-17 NOTE — Assessment & Plan Note (Addendum)
Taking maxzide only  Does not check at home.  Takes NSAIDs daily .  Advised to suspend NSAID and have BP rechecked.  Tramadol prescribed as alternative.  Lab Results  Component Value Date   CREATININE 0.69 08/01/2020   Lab Results  Component Value Date   NA 136 08/01/2020   K 4.5 08/01/2020   CL 101 08/01/2020   CO2 28 08/01/2020

## 2020-10-17 NOTE — Assessment & Plan Note (Signed)
Untreated for years due to side sleeping.  Does not want to repeat study

## 2020-10-17 NOTE — Progress Notes (Signed)
Patient ID: John Wolf, male    DOB: Sep 05, 1955  Age: 65 y.o. MRN: [id number]  The patient is here for annual preventive  examination and management of other chronic and acute problems.  This visit occurred during the SARS-CoV-2 public health emergency.  Safety protocols were in place, including screening questions prior to the visit, additional usage of staff PPE, and extensive cleaning of exam room while observing appropriate contact time as indicated for disinfecting solutions.     The risk factors are reflected in the social history.  The roster of all physicians providing medical care to patient - is listed in the Snapshot section of the chart.  Activities of daily living:  The patient is 100% independent in all ADLs: dressing, toileting, feeding as well as independent mobility  Home safety : The patient has smoke detectors in the home. They wear seatbelts.  There are no firearms at home. There is no violence in the home.   There is no risks for hepatitis, STDs or HIV. There is no   history of blood transfusion. They have no travel history to infectious disease endemic areas of the world.  The patient has seen their dentist in the last six month. They have seen their eye doctor in the last year. They admit to slight hearing difficulty with regard to whispered voices and some television programs.  They have deferred audiologic testing in the last year.  They do not  have excessive sun exposure. Discussed the need for sun protection: hats, long sleeves and use of sunscreen if there is significant sun exposure.   Diet: the importance of a healthy diet is discussed. They do have a healthy diet.  The benefits of regular aerobic exercise were discussed. She walks 4 times per week ,  20 minutes.   Depression screen: there are no signs or vegative symptoms of depression- irritability, change in appetite, anhedonia, sadness/tearfullness.  Cognitive assessment: the patient manages all their  financial and personal affairs and is actively engaged. They could relate day,date,year and events; recalled 2/3 objects at 3 minutes; performed clock-face test normally.  The following portions of the patient's history were reviewed and updated as appropriate: allergies, current medications, past family history, past medical history,  past surgical history, past social history  and problem list.  Visual acuity was not assessed per patient preference since she has regular follow up with her ophthalmologist. Hearing and body mass index were assessed and reviewed.   During the course of the visit the patient was educated and counseled about appropriate screening and preventive services including : fall prevention , diabetes screening, nutrition counseling, colorectal cancer screening, and recommended immunizations.    CC: The primary encounter diagnosis was Colon cancer screening. Diagnoses of Primary hypertension, OSA (obstructive sleep apnea), Vitamin D deficiency, Acquired pes planovalgus, right, Visit for preventive health examination, and Morbid obesity ([place]) were also pertinent to this visit.  History Jalien has a past medical history of Acquired pes planus, Arthritis, Benign neoplasm of colon, Hyperlipidemia, Hypertension, Obesity (BMI 30-39.9), Onychia and paronychia of toe, and Sleep apnea, obstructive (2002).   He has a past surgical history that includes left shoulder.   His family history includes Cancer (age of onset: 69) in his brother; Heart disease in his mother; Hypertension in his brother; Stroke in his brother and father.He reports that he has never smoked. He has never used smokeless tobacco. He reports current alcohol use. He reports that he does not use drugs.  Outpatient Medications Prior  to Visit  Medication Sig Dispense Refill   diclofenac (VOLTAREN) 75 MG EC tablet TAKE 1 TABLET BY MOUTH 2 TIMES DAILY. 180 tablet 1   triamterene-hydrochlorothiazide (MAXZIDE-25) 37.5-25 MG  tablet TAKE 1 TABLET BY MOUTH DAILY 90 tablet 1   cyanocobalamin (,VITAMIN B-12,) 1000 MCG/ML injection Inject 1 mL (1,000 mcg total) into the muscle once a week. For 3 weeks,  Then monthly (Patient not taking: Reported on 10/17/2020) 1 mL 11   Syringe/Needle, Disp, (SYRINGE 3CC/25GX1") 25G X 1" 3 ML MISC Use for b12 injections (Patient not taking: Reported on 10/17/2020) 50 each 0   gabapentin (NEURONTIN) 300 MG capsule Take by mouth. (Patient not taking: Reported on 10/17/2020)     HYDROcodone-acetaminophen (NORCO) 10-325 MG tablet Take 1 tablet by mouth every 6 (six) hours as needed. (Patient not taking: No sig reported) 30 tablet 0   Omega-3 Fatty Acids (FISH OIL PO) Take by mouth. (Patient not taking: Reported on 10/17/2020)     omeprazole (PRILOSEC) 40 MG capsule Take 1 capsule (40 mg total) by mouth daily. (Patient not taking: Reported on 10/17/2020) 90 capsule 1   No facility-administered medications prior to visit.    Review of Systems  Patient denies headache, fevers, malaise, unintentional weight loss, skin rash, eye pain, sinus congestion and sinus pain, sore throat, dysphagia,  hemoptysis , cough, dyspnea, wheezing, chest pain, palpitations, orthopnea, edema, abdominal pain, nausea, melena, diarrhea, constipation, flank pain, dysuria, hematuria, urinary  Frequency, nocturia, numbness, tingling, seizures,  Focal weakness, Loss of consciousness,  Tremor, insomnia, depression, anxiety, and suicidal ideation.     Objective:  BP 140/76 (BP Location: Left Arm, Patient Position: Sitting, Cuff Size: Large)   Pulse 62   Temp (!) 97 F (36.1 C) (Temporal)   Resp 16   Ht 5\' 8"  (1.727 m)   Wt 294 lb 3.2 oz (133.4 kg)   SpO2 97%   BMI 44.73 kg/m   Physical Exam  General appearance: alert, cooperative and appears stated age Ears: normal TM's and external ear canals both ears Throat: lips, mucosa, and tongue normal; teeth and gums normal Neck: no adenopathy, no carotid bruit, supple,  symmetrical, trachea midline and thyroid not enlarged, symmetric, no tenderness/mass/nodules Back: symmetric, no curvature. ROM normal. No CVA tenderness. Lungs: clear to auscultation bilaterally Heart: regular rate and rhythm, S1, S2 normal, no murmur, click, rub or gallop Abdomen: soft, non-tender; bowel sounds normal; no masses,  no organomegaly Pulses: 2+ and symmetric Skin: Skin color, texture, turgor normal. 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    Assessment & Plan:   Problem List Items Addressed This Visit       Unprioritized   Acquired pes planovalgus, right    He has postponed surgical correction until after retirement and has decided to start the [employer] to lose weight       Hypertension    Taking maxzide only  Does not check at home.  Takes NSAIDs daily .  Advised to suspend NSAID and have BP rechecked.  Tramadol prescribed as alternative.  Lab Results  Component Value Date   CREATININE 0.69 08/01/2020   Lab Results  Component Value Date   NA 136 08/01/2020   K 4.5 08/01/2020   CL 101 08/01/2020   CO2 28 08/01/2020          Morbid obesity ([place])    His personal best was 244  lbs in 2018,  With a high of 314 lbs  In @021 .  Currently weights 294 lbs , Rviewed the merits of the [employer] diet        OSA (obstructive sleep apnea)    Untreated for years due to side sleeping.  Does not want to repeat study        Visit for preventive health examination    age appropriate education and counseling updated, referrals for preventative services and immunizations addressed, dietary and smoking counseling addressed, most recent labs reviewed.  I have personally reviewed and have noted:   1) the patient's medical and social history 2) The pt's use of alcohol, tobacco, and illicit drugs 3) The patient's current medications and supplements 4) Functional  ability including ADL's, fall risk, home safety risk, hearing and visual impairment 5) Diet and physical activities 6) Evidence for depression or mood disorder 7) The patient's height, weight, and BMI have been recorded in the chart   I have made referrals, and provided counseling and education based on review of the above       Vitamin D deficiency    Taking a daily supplement instead of monthly,  Since April        Relevant Orders   VITAMIN D 25 Hydroxy (Vit-D Deficiency, Fractures) (Completed)   Other Visit Diagnoses     Colon cancer screening    -  Primary   Relevant Orders   Cologuard       I have discontinued Paras W. Kelty's Omega-3 Fatty Acids (FISH OIL PO), HYDROcodone-acetaminophen, gabapentin, and omeprazole. I am also having him start on traMADol and tiZANidine. Additionally, I am having him maintain his cyanocobalamin, SYRINGE 3CC/25GX1", triamterene-hydrochlorothiazide, and diclofenac.  Meds ordered this encounter  Medications   traMADol (ULTRAM) 50 MG tablet    Sig: Take 1 tablet (50 mg total) by mouth every 6 (six) hours as needed.    Dispense:  28 tablet    Refill:  0   tiZANidine (ZANAFLEX) 4 MG tablet    Sig: Take 1 tablet (4 mg total) by mouth every 6 (six) hours as needed for muscle spasms.    Dispense:  30 tablet    Refill:  0   DISCONTD: omeprazole (PRILOSEC) 40 MG capsule    Sig: Take 1 capsule (40 mg total) by mouth daily.    Dispense:  90 capsule    Refill:  1    Medications Discontinued During This Encounter  Medication Reason   gabapentin (NEURONTIN) 300 MG capsule    HYDROcodone-acetaminophen (NORCO) 10-325 MG tablet    Omega-3 Fatty Acids (FISH OIL PO)    omeprazole (PRILOSEC) 40 MG capsule Reorder    Follow-up: No follow-ups on file.   Crecencio Mc, MD

## 2020-10-17 NOTE — Assessment & Plan Note (Signed)
Taking a daily supplement instead of monthly,  Since April

## 2020-10-19 ENCOUNTER — Other Ambulatory Visit: Payer: Self-pay | Admitting: Internal Medicine

## 2020-10-19 MED ORDER — PANTOPRAZOLE SODIUM 20 MG PO TBEC: 20.0000 mg | DELAYED_RELEASE_TABLET | Freq: Every day | ORAL | 1 refills | Status: DC

## 2020-10-20 NOTE — Assessment & Plan Note (Addendum)
His personal best was 244 lbs in 2018,  With a high of 314 lbs  In @021 .  Currently weights 294 lbs , Rviewed the merits of the [employer]

## 2020-10-20 NOTE — Assessment & Plan Note (Signed)

## 2020-10-20 NOTE — Assessment & Plan Note (Signed)
He has postponed surgical correction until after retirement and has decided to start the [place] to lose weight

## 2020-10-21 ENCOUNTER — Telehealth: Payer: Self-pay

## 2020-10-21 ENCOUNTER — Other Ambulatory Visit: Payer: Self-pay | Admitting: Internal Medicine

## 2020-10-21 DIAGNOSIS — E559 Vitamin D deficiency, unspecified: Secondary | ICD-10-CM

## 2020-10-21 MED ORDER — VITAMIN D (ERGOCALCIFEROL) 1.25 MG (50000 UNIT) PO CAPS: 50000.0000 [IU] | ORAL_CAPSULE | ORAL | 3 refills | Status: DC

## 2020-10-21 NOTE — Telephone Encounter (Signed)
LMTCB in regards to lab results.  

## 2020-10-21 NOTE — Assessment & Plan Note (Signed)
Recurrently low.  Prescribing the weekly megadose.

## 2020-10-21 NOTE — Telephone Encounter (Signed)
Patient returned office phone call for lab results. 

## 2020-10-21 NOTE — Telephone Encounter (Signed)
See result note message 

## 2020-10-22 NOTE — Telephone Encounter (Signed)
PT called to speak to Janett Billow in regards to Labs. Wanted to discuss them more.

## 2020-10-22 NOTE — Telephone Encounter (Signed)
Spoke with pt and he stated that since he stopped taking the diclofenac he has had the worst joint pain. Pt stated that he has been taking the tylenol and it is not helping. Pt is wanting to know if there is something he can take or go back to taking the diclofenac.

## 2020-10-23 NOTE — Telephone Encounter (Signed)
LMTCB

## 2020-10-23 NOTE — Telephone Encounter (Signed)
Spoke with pt and he stated that he has been taking the tramadol twice daily, tylenol and the tizanidine. He stated he has never had his joints hurt this bad before.

## 2020-10-23 NOTE — Telephone Encounter (Signed)
Pt returned your call.  

## 2021-01-28 ENCOUNTER — Other Ambulatory Visit: Payer: Self-pay | Admitting: Internal Medicine

## 2021-03-10 ENCOUNTER — Other Ambulatory Visit: Payer: Self-pay | Admitting: Internal Medicine

## 2021-03-10 ENCOUNTER — Ambulatory Visit (INDEPENDENT_AMBULATORY_CARE_PROVIDER_SITE_OTHER): Payer: BC Managed Care – PPO

## 2021-03-10 ENCOUNTER — Other Ambulatory Visit: Payer: Self-pay

## 2021-03-10 ENCOUNTER — Ambulatory Visit (INDEPENDENT_AMBULATORY_CARE_PROVIDER_SITE_OTHER): Payer: BC Managed Care – PPO | Admitting: Internal Medicine

## 2021-03-10 VITALS — BP 118/70 | HR 80 | Temp 96.9°F | Ht 68.0 in | Wt 244.2 lb

## 2021-03-10 DIAGNOSIS — R0602 Shortness of breath: Secondary | ICD-10-CM

## 2021-03-10 DIAGNOSIS — R42 Dizziness and giddiness: Secondary | ICD-10-CM

## 2021-03-10 DIAGNOSIS — M19071 Primary osteoarthritis, right ankle and foot: Secondary | ICD-10-CM

## 2021-03-10 DIAGNOSIS — R000 Tachycardia, unspecified: Secondary | ICD-10-CM | POA: Diagnosis not present

## 2021-03-10 DIAGNOSIS — M7122 Synovial cyst of popliteal space [Baker], left knee: Secondary | ICD-10-CM

## 2021-03-10 DIAGNOSIS — R9431 Abnormal electrocardiogram [ECG] [EKG]: Secondary | ICD-10-CM

## 2021-03-10 DIAGNOSIS — I471 Supraventricular tachycardia, unspecified: Secondary | ICD-10-CM

## 2021-03-10 DIAGNOSIS — R634 Abnormal weight loss: Secondary | ICD-10-CM

## 2021-03-10 DIAGNOSIS — I951 Orthostatic hypotension: Secondary | ICD-10-CM

## 2021-03-10 DIAGNOSIS — R012 Other cardiac sounds: Secondary | ICD-10-CM | POA: Diagnosis not present

## 2021-03-10 DIAGNOSIS — E538 Deficiency of other specified B group vitamins: Secondary | ICD-10-CM | POA: Diagnosis not present

## 2021-03-10 DIAGNOSIS — I959 Hypotension, unspecified: Secondary | ICD-10-CM | POA: Diagnosis not present

## 2021-03-10 LAB — CBC WITH DIFFERENTIAL/PLATELET
Basophils Absolute: 0.1 10*3/uL (ref 0.0–0.1)
Basophils Relative: 1 % (ref 0.0–3.0)
Eosinophils Absolute: 0.1 10*3/uL (ref 0.0–0.7)
Eosinophils Relative: 2 % (ref 0.0–5.0)
HCT: 43.6 % (ref 39.0–52.0)
Hemoglobin: 14.7 g/dL (ref 13.0–17.0)
Lymphocytes Relative: 15.2 % (ref 12.0–46.0)
Lymphs Abs: 1 10*3/uL (ref 0.7–4.0)
MCHC: 33.6 g/dL (ref 30.0–36.0)
MCV: 94.1 fl (ref 78.0–100.0)
Monocytes Absolute: 0.5 10*3/uL (ref 0.1–1.0)
Monocytes Relative: 8.5 % (ref 3.0–12.0)
Neutro Abs: 4.6 10*3/uL (ref 1.4–7.7)
Neutrophils Relative %: 73.3 % (ref 43.0–77.0)
Platelets: 209 10*3/uL (ref 150.0–400.0)
RBC: 4.64 Mil/uL (ref 4.22–5.81)
RDW: 13.1 % (ref 11.5–15.5)
WBC: 6.3 10*3/uL (ref 4.0–10.5)

## 2021-03-10 LAB — COMPREHENSIVE METABOLIC PANEL
ALT: 13 U/L (ref 0–53)
AST: 17 U/L (ref 0–37)
Albumin: 3.9 g/dL (ref 3.5–5.2)
Alkaline Phosphatase: 46 U/L (ref 39–117)
BUN: 18 mg/dL (ref 6–23)
CO2: 29 mEq/L (ref 19–32)
Calcium: 9.3 mg/dL (ref 8.4–10.5)
Chloride: 102 mEq/L (ref 96–112)
Creatinine, Ser: 0.82 mg/dL (ref 0.40–1.50)
GFR: 93.73 mL/min (ref >60.00)
Glucose, Bld: 85 mg/dL (ref 70–99)
Potassium: 4.1 mEq/L (ref 3.5–5.1)
Sodium: 138 mEq/L (ref 135–145)
Total Bilirubin: 0.9 mg/dL (ref 0.2–1.2)
Total Protein: 5.9 g/dL — ABNORMAL LOW (ref 6.0–8.3)

## 2021-03-10 LAB — LIPID PANEL
Cholesterol: 159 mg/dL (ref 0–200)
HDL: 60.9 mg/dL (ref >39.00)
LDL Cholesterol: 86 mg/dL (ref 0–99)
NonHDL: 98.5
Total CHOL/HDL Ratio: 3
Triglycerides: 64 mg/dL (ref 0.0–149.0)
VLDL: 12.8 mg/dL (ref 0.0–40.0)

## 2021-03-10 LAB — MAGNESIUM: Magnesium: 1.8 mg/dL (ref 1.5–2.5)

## 2021-03-10 LAB — TROPONIN I (HIGH SENSITIVITY): High Sens Troponin I: 23 ng/L (ref 2–17)

## 2021-03-10 LAB — B12 AND FOLATE PANEL
Folate: >23.4 ng/mL (ref >5.9)
Vitamin B-12: 1520 pg/mL — ABNORMAL HIGH (ref 211–911)

## 2021-03-10 LAB — TSH: TSH: 0.86 u[IU]/mL (ref 0.35–5.50)

## 2021-03-10 LAB — HEMOGLOBIN A1C: Hgb A1c MFr Bld: 4.7 % (ref 4.6–6.5)

## 2021-03-10 LAB — BRAIN NATRIURETIC PEPTIDE: Pro B Natriuretic peptide (BNP): 104 pg/mL — ABNORMAL HIGH (ref 0.0–100.0)

## 2021-03-10 NOTE — Assessment & Plan Note (Addendum)
Symptomatic today.  Secondary to continued use of diuretics for control of hypertension when he has likely achieved normotension through a  dramatic weight loss .  Stopping triamterene/hctz.  Advised to liberalize salt intake today

## 2021-03-10 NOTE — Patient Instructions (Addendum)
Your BP is too low.   No more BP meds!@!! (Triamterene hctz)   Buy an omron BP machine  (automatic ) and check daily for the next few days and text me your readings [place])  Have a little salt with lunch today and with dinner to allow your BP to come back up    Your EKG has changed since 2011. The reason is unclear:  If electrolytes are off,  we will fix and repeat the EKG, but you need to see cardiology anyway for a preoperative evaluation   If lytes are normal  No explanation for  EKG changes,  need to see cardiology   Add 1000 mg acetominophen (tylenol ) twice daily  If pain is not controlled with tylenol/diclofenac.  We can add gabapentin for the nerve pain

## 2021-03-10 NOTE — Assessment & Plan Note (Signed)
He has lost 50 lbs inc 6 months using the [place] and caloric restriction

## 2021-03-10 NOTE — Assessment & Plan Note (Signed)
He is scheduled to see [facility] in December to discuss  Reconstructive surgery

## 2021-03-10 NOTE — Assessment & Plan Note (Signed)
He has a split S2 on exam today and an EKGdone today and read by me is notable for a RBBB and a LAFB ,  Both new compared to 2011 EKG.

## 2021-03-10 NOTE — Progress Notes (Signed)
Subjective:  Patient ID: Abran Duke, male    DOB: [birth year]  Age: 65 y.o. MRN: [id number]  CC: The primary encounter diagnosis was Abnormal second heart sound (S2). Diagnoses of Light headed, Paroxysmal supraventricular tachycardia ([place]), B12 deficiency, Abnormal weight loss, Shortness of breath, Light headedness, Baker's cyst of knee, left, Morbid obesity ([place]), Orthostatic hypotension, Osteoarthritis of right subtalar joint, and Abnormal EKG were also pertinent to this visit.  HPI ERIN UECKER presents for  Chief Complaint  Patient presents with   Hypotension    This visit occurred during the SARS-CoV-2 public health emergency.  Safety protocols were in place, including screening questions prior to the visit, additional usage of staff PPE, and extensive cleaning of exam room while observing appropriate contact time as indicated for disinfecting solutions.    "I didn't feel right today"  but denies dizziness . Felt wired, felt his heart was beating rapidly.   65 YR OLD MALE WITH HISTORY OF of hypertension, morbid obesity with untreated OSA, chronic low back pain and ankle pain due to degenerative arthritis presents with sudden onset of tachycardia and dizziness.  Home bp READINGS done by partner have been low 597'C/BULA 45'X systolic. Has lost 50 lbs over the last six months intentionally using the [place] and portion restriction .  Has been Drinking  one gallon of water daily  since June  has not taken  maxzide for 2 days,  then took it today / for HTN but does not check BP regularly.   Losing 3-4 lbs per week per home scales.  But has plateaued this week despite restricting diet  has eliminated bread,  eating vegetables ,  boiled eggs,  dried [place] sticks.    Drinking 2-3 shots of vodka daily.  Grilled chicken/steak for dinner with veggies and salad.  Diet lemonade   Positive orthostasis noted today with a drop of systolic BP of ten pts and a rise in pulse of 40 pts    EKG done today and compared to 2011: no ischemic changes  sinus rhythm,  rate 75  ,  with new RBBB and LAFB . Cardiology referral advised.    Urinating more frequently due to increased water intake   Right sided sciatica  , chronic . Managed with diclofenac only and protonix. Had 2 ESI from [place] which did not help.    Outpatient Medications Prior to Visit  Medication Sig Dispense Refill   cyanocobalamin (,VITAMIN B-12,) 1000 MCG/ML injection Inject 1 mL (1,000 mcg total) into the muscle once a week. For 3 weeks,  Then monthly (Patient taking differently: Inject 1,000 mcg into the muscle once a week. For 3 weeks,  Then monthly) 1 mL 11   diclofenac (VOLTAREN) 75 MG EC tablet TAKE 1 TABLET BY MOUTH 2 TIMES DAILY. 180 tablet 1   pantoprazole (PROTONIX) 20 MG tablet TAKE ONE TABLET EVERY DAY 90 tablet 1   Syringe/Needle, Disp, (SYRINGE 3CC/25GX1") 25G X 1" 3 ML MISC Use for b12 injections 50 each 0   triamterene-hydrochlorothiazide (MAXZIDE-25) 37.5-25 MG tablet TAKE 1 TABLET BY MOUTH DAILY 90 tablet 1   Vitamin D, Ergocalciferol, (DRISDOL) 1.25 MG (50000 UNIT) CAPS capsule Take 1 capsule (50,000 Units total) by mouth every 7 (seven) days. 12 capsule 3   tiZANidine (ZANAFLEX) 4 MG tablet Take 1 tablet (4 mg total) by mouth every 6 (six) hours as needed for muscle spasms. (Patient not taking: Reported on 03/10/2021) 30 tablet 0   traMADol (ULTRAM) 50 MG tablet  Take 1 tablet (50 mg total) by mouth every 6 (six) hours as needed. (Patient not taking: Reported on 03/10/2021) 28 tablet 0   No facility-administered medications prior to visit.    Review of Systems;  Patient denies headache, fevers, malaise, unintentional weight loss, skin rash, eye pain, sinus congestion and sinus pain, sore throat, dysphagia,  hemoptysis , cough, dyspnea, wheezing, chest pain, palpitations, orthopnea, edema, abdominal pain, nausea, melena, diarrhea, constipation, flank pain, dysuria, hematuria, urinary  Frequency,  nocturia, numbness, tingling, seizures,  Focal weakness, Loss of consciousness,  Tremor, insomnia, depression, anxiety, and suicidal ideation.      Objective:  BP 118/70   Pulse 80   Temp (!) 96.9 F (36.1 C) (Skin)   Ht 5\' 8"  (1.727 m)   Wt 244 lb 3.2 oz (110.8 kg)   SpO2 96%   BMI 37.13 kg/m   BP Readings from Last 3 Encounters:  03/10/21 118/70  10/17/20 140/76  08/10/19 (!) 167/107    Wt Readings from Last 3 Encounters:  03/10/21 244 lb 3.2 oz (110.8 kg)  10/17/20 294 lb 3.2 oz (133.4 kg)  08/10/19 (!) 306 lb (138.8 kg)    General appearance: alert, cooperative and appears stated age Ears: normal TM's and external ear canals both ears Throat: lips, mucosa, and tongue normal; teeth and gums normal Neck: no adenopathy, no carotid bruit, supple, symmetrical, trachea midline and thyroid not enlarged, symmetric, no tenderness/mass/nodules Back: symmetric, no curvature. ROM normal. No CVA tenderness. Lungs: clear to auscultation bilaterally Heart: regular rate and rhythm, S1, split S2, no murmur, click, rub or gallop Abdomen: soft, non-tender; bowel sounds normal; no masses,  no organomegaly Pulses: 2+ and symmetric Skin: Skin color, texture, turgor normal. No rashes or lesions Lymph nodes: Cervical, supraclavicular, and axillary nodes normal.  Lab Results  Component Value Date   HGBA1C 5.3 05/25/2019   HGBA1C 5.2 06/16/2018   HGBA1C 5.4 08/06/2015    Lab Results  Component Value Date   CREATININE 0.69 08/01/2020   CREATININE 0.73 05/25/2019   CREATININE 0.85 06/16/2018    Lab Results  Component Value Date   WBC 5.0 10/29/2016   HGB 15.0 10/29/2016   HCT 43.7 10/29/2016   PLT 209.0 10/29/2016   GLUCOSE 83 08/01/2020   CHOL 182 08/01/2020   TRIG 197.0 (H) 08/01/2020   HDL 42.50 08/01/2020   LDLDIRECT 106.0 06/16/2018   LDLCALC 101 (H) 08/01/2020   ALT 25 08/01/2020   AST 19 08/01/2020   NA 136 08/01/2020   K 4.5 08/01/2020   CL 101 08/01/2020    CREATININE 0.69 08/01/2020   BUN 19 08/01/2020   CO2 28 08/01/2020   TSH 0.73 08/01/2020   PSA 0.87 08/01/2020   HGBA1C 5.3 05/25/2019   MICROALBUR 1.0 08/01/2020    US Venous Img Lower Unilateral Left (DVT)  Result Date: 05/25/2019 CLINICAL DATA:  Unable to bend knee x1 day.  Varicose/spider veins. EXAM: LEFT LOWER EXTREMITY VENOUS DOPPLER ULTRASOUND TECHNIQUE: Gray-scale sonography with compression, as well as color and duplex ultrasound, were performed to evaluate the deep venous system(s) from the level of the common femoral vein through the popliteal and proximal calf veins. COMPARISON:  None. FINDINGS: VENOUS Normal compressibility of the common femoral, superficial femoral, and popliteal veins, as well as the visualized calf veins. Visualized portions of profunda femoral vein and great saphenous vein unremarkable. No filling defects to suggest DVT on grayscale or color Doppler imaging. Doppler waveforms show normal direction of venous flow, normal respiratory phasicity and  response to augmentation. Limited views of the contralateral common femoral vein are unremarkable. OTHER There is a large cystic collection in the posterior popliteal fossa measuring 11.5 cm craniocaudal. Limitations: none IMPRESSION: 1. No femoropopliteal and no calf DVT in the visualized calf veins. If clinical symptoms are inconsistent or if there are persistent or worsening symptoms, further imaging (possibly involving the iliac veins) may be warranted. 2. Large left Baker's cyst. Electronically Signed   By: Lucrezia Europe M.D.   On: 05/25/2019 10:57    Assessment & Plan:   Problem List Items Addressed This Visit     Morbid obesity ([place])    He has lost 50 lbs inc 6 months using the [place] and caloric restriction       Osteoarthritis of right subtalar joint    He is scheduled to [facility] December to discuss  Reconstructive surgery       Baker's cyst of knee, left   B12 deficiency   Orthostatic  hypotension    Symptomatic today.  Secondary to continued use of diuretics for control of hypertension when he has likely achieved normotension through a  dramatic weight loss .  Stopping triamterene/hctz.  Advised to liberalize salt intake today       Abnormal EKG    He has a split S2 on exam today and an EKGdone today and read by me is notable for a RBBB and a LAFB ,  Both new compared to 2011 EKG.        Relevant Orders   Ambulatory referral to Cardiology   Other Visit Diagnoses     Abnormal second heart sound (S2)    -  Primary   Relevant Orders   Ambulatory referral to Cardiology   Light headed       Relevant Orders   Troponin I (High Sensitivity)   Paroxysmal supraventricular tachycardia ([facility])       Relevant Orders   Troponin I (High Sensitivity)   Abnormal weight loss       Shortness of breath       Light headedness           I have discontinued Kizer W. Beckford's traMADol and tiZANidine. I am also having him maintain his cyanocobalamin, SYRINGE 3CC/25GX1", triamterene-hydrochlorothiazide, diclofenac, Vitamin D (Ergocalciferol), and pantoprazole.  No orders of the defined types were placed in this encounter.   Medications Discontinued During This Encounter  Medication Reason   traMADol (ULTRAM) 50 MG tablet Patient Preference   tiZANidine (ZANAFLEX) 4 MG tablet Patient Preference    Follow-up: No follow-ups on file.   Crecencio Mc, MD

## 2021-03-24 ENCOUNTER — Encounter: Payer: Self-pay | Admitting: Internal Medicine

## 2021-03-24 DIAGNOSIS — M48061 Spinal stenosis, lumbar region without neurogenic claudication: Secondary | ICD-10-CM | POA: Insufficient documentation

## 2021-03-30 DIAGNOSIS — M5440 Lumbago with sciatica, unspecified side: Secondary | ICD-10-CM | POA: Diagnosis not present

## 2021-03-30 DIAGNOSIS — M19071 Primary osteoarthritis, right ankle and foot: Secondary | ICD-10-CM | POA: Diagnosis not present

## 2021-03-30 DIAGNOSIS — M170 Bilateral primary osteoarthritis of knee: Secondary | ICD-10-CM | POA: Diagnosis not present

## 2021-03-30 DIAGNOSIS — M1712 Unilateral primary osteoarthritis, left knee: Secondary | ICD-10-CM | POA: Diagnosis not present

## 2021-03-30 DIAGNOSIS — M2141 Flat foot [pes planus] (acquired), right foot: Secondary | ICD-10-CM | POA: Diagnosis not present

## 2021-03-30 DIAGNOSIS — M21061 Valgus deformity, not elsewhere classified, right knee: Secondary | ICD-10-CM | POA: Diagnosis not present

## 2021-03-30 DIAGNOSIS — M25571 Pain in right ankle and joints of right foot: Secondary | ICD-10-CM | POA: Diagnosis not present

## 2021-03-30 DIAGNOSIS — M7731 Calcaneal spur, right foot: Secondary | ICD-10-CM | POA: Diagnosis not present

## 2021-04-10 ENCOUNTER — Other Ambulatory Visit: Payer: Self-pay

## 2021-04-10 ENCOUNTER — Ambulatory Visit (INDEPENDENT_AMBULATORY_CARE_PROVIDER_SITE_OTHER): Payer: BC Managed Care – PPO | Admitting: Cardiology

## 2021-04-10 ENCOUNTER — Encounter: Payer: Self-pay | Admitting: Cardiology

## 2021-04-10 VITALS — BP 124/62 | HR 59 | Ht 66.0 in | Wt 234.0 lb

## 2021-04-10 DIAGNOSIS — I452 Bifascicular block: Secondary | ICD-10-CM

## 2021-04-10 DIAGNOSIS — R9431 Abnormal electrocardiogram [ECG] [EKG]: Secondary | ICD-10-CM | POA: Diagnosis not present

## 2021-04-10 DIAGNOSIS — I10 Essential (primary) hypertension: Secondary | ICD-10-CM | POA: Diagnosis not present

## 2021-04-10 DIAGNOSIS — Z6837 Body mass index (BMI) 37.0-37.9, adult: Secondary | ICD-10-CM

## 2021-04-10 NOTE — Patient Instructions (Signed)
Medication Instructions:   Your physician recommends that you continue on your current medications as directed. Please refer to the Current Medication list given to you today.   *If you need a refill on your cardiac medications before your next appointment, please call your pharmacy*   Lab Work: None ordered  If you have labs (blood work) drawn today and your tests are completely normal, you will receive your results only by: [place] (if you have MyChart) OR A paper copy in the mail If you have any lab test that is abnormal or we need to change your treatment, we will call you to review the results.   Testing/Procedures: None ordered   Follow-Up: At [facility], you and your health needs are our priority.  As part of our continuing mission to provide you with exceptional heart care, we have created designated Provider Care Teams.  These Care Teams include your primary Cardiologist (physician) and Advanced Practice Providers (APPs -  Physician Assistants and Nurse Practitioners) who all work together to provide you with the care you need, when you need it.  We recommend signing up for the patient portal called "MyChart".  Sign up information is provided on this After Visit Summary.  MyChart is used to connect with patients for Virtual Visits (Telemedicine).  Patients are able to view lab/test results, encounter notes, upcoming appointments, etc.  Non-urgent messages can be sent to your provider as well.   To learn more about what you can do with MyChart, go to [email or web address].    Your next appointment:   As needed  The format for your next appointment:   In Person  Provider:   You may see Kate Sable, MD or one of the following Advanced Practice Providers on your designated Care Team:   Murray Hodgkins, NP Christell Faith, PA-C Cadence Kathlen Mody, PA-C  :1}    Other Instructions N/A

## 2021-04-10 NOTE — Progress Notes (Signed)
Cardiology Office Note:    Date:  04/10/2021   ID:  John Wolf, DOB 02-01-1956, MRN [id number]  PCP:  Crecencio Mc, MD   [facility] Providers Cardiologist:  Kate Sable, MD     Referring MD: Crecencio Mc, MD   Chief Complaint  Patient presents with   New Patient (Initial Visit)    Referred by PCP for Abnormal EKG. Meds reviewed verbally with patient.    John Wolf is a 65 y.o. male who is being seen today for the evaluation of abnormal EKG at the request of Crecencio Mc, MD.   History of Present Illness:    John Wolf is a 65 y.o. male with a hx of hypertension, obesity, OSA, chronic back pain presenting due to abnormal EKG.  Saw PCP 03/10/2021 for scheduled visit.  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  He denies chest pain, shortness of breath, dizziness.  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  He is trying to lose weight by eating healthier, states losing about 50 pounds over the past 4 to 5 months.  Denies smoking, planning on having right ankle and knee surgery in the near future.  Previously was on blood pressure medications, blood pressure improved with weight loss.  Had 1 episode of palpitations after drinking 3 cups of Cuban coffee.  Remains asymptomatic with cutting back on caffeine.  Past Medical History:  Diagnosis Date   Acquired pes planus    Arthritis    Benign neoplasm of colon    Hyperlipidemia    Hypertension    Obesity (BMI 30-39.9)    with 70 lb weight loss, ongoing   Onychia and paronychia of toe    Sleep apnea, obstructive 2002   wears CPAP    Past Surgical History:  Procedure Laterality Date   left shoulder     shoulder dislocation, rotator     Current Medications: Current Meds  Medication Sig   cyanocobalamin (,VITAMIN B-12,) 1000 MCG/ML injection Inject 1 mL (1,000 mcg total) into the muscle once a week. For 3 weeks,  Then monthly    diclofenac (VOLTAREN) 75 MG EC tablet TAKE 1 TABLET BY MOUTH 2 TIMES DAILY.   pantoprazole (PROTONIX) 20 MG tablet TAKE ONE TABLET EVERY DAY   Syringe/Needle, Disp, (SYRINGE 3CC/25GX1") 25G X 1" 3 ML MISC Use for b12 injections   Vitamin D, Ergocalciferol, (DRISDOL) 1.25 MG (50000 UNIT) CAPS capsule Take 1 capsule (50,000 Units total) by mouth every 7 (seven) days.     Allergies:   Patient has no known allergies.   Social History   Socioeconomic History   Marital status: Married    Spouse name: Not on file   Number of children: 1   Years of education: Not on file   Highest education level: Not on file  Occupational History   Occupation: [employer]  Tobacco Use   Smoking status: Never   Smokeless tobacco: Never  Vaping Use   Vaping Use: Never used  Substance and Sexual Activity   Alcohol use: Yes   Drug use: Never   Sexual activity: Not on file  Other Topics Concern   Not on file  Social History Narrative   Right handed   One story home   Coffee q am   Social Determinants of Health   Financial Resource Strain: Not on file  Food Insecurity: Not on file  Transportation Needs: Not on  file  Physical Activity: Not on file  Stress: Not on file  Social Connections: Not on file     Family History: The patient's family history includes Cancer (age of onset: 60) in his brother; Heart disease in his mother; Hypertension in his brother; Stroke in his brother and father.  ROS:   Please see the history of present illness.     All other systems reviewed and are negative.  EKGs/Labs/Other Studies Reviewed:    The following studies were reviewed today:   EKG:  EKG is  ordered today.  The ekg ordered today demonstrates normal sinus rhythm, right bundle branch block.  Recent Labs: 03/10/2021: ALT 13; BUN 18; Creatinine, Ser 0.82; Hemoglobin 14.7; Magnesium 1.8; Platelets 209.0; Potassium 4.1; Pro B Natriuretic peptide (BNP) 104.0; Sodium 138; TSH 0.86  Recent Lipid  Panel    Component Value Date/Time   CHOL 159 03/10/2021 1131   TRIG 64.0 03/10/2021 1131   HDL 60.90 03/10/2021 1131   CHOLHDL 3 03/10/2021 1131   VLDL 12.8 03/10/2021 1131   LDLCALC 86 03/10/2021 1131   LDLDIRECT 106.0 06/16/2018 1057     Risk Assessment/Calculations:          Physical Exam:    VS:  BP 124/62 (BP Location: Left Arm, Patient Position: Sitting, Cuff Size: Large)    Pulse (!) 59    Ht 5\' 6"  (1.676 m)    Wt 234 lb (106.1 kg)    SpO2 95%    BMI 37.77 kg/m     Wt Readings from Last 3 Encounters:  04/10/21 234 lb (106.1 kg)  03/10/21 244 lb 3.2 oz (110.8 kg)  10/17/20 294 lb 3.2 oz (133.4 kg)     GEN:  Well nourished, well developed in no acute distress HEENT: Normal NECK: No JVD; No carotid bruits LYMPHATICS: No lymphadenopathy CARDIAC: RRR, no murmurs, rubs, gallops RESPIRATORY:  Clear to auscultation without rales, wheezing or rhonchi  ABDOMEN: Soft, non-tender, non-distended MUSCULOSKELETAL:  No edema; No deformity  SKIN: Warm and dry NEUROLOGIC:  Alert and oriented x 3 PSYCHIATRIC:  Normal affect   ASSESSMENT:    1. RBBB (right bundle branch block with left anterior fascicular block)   2. BMI 37.0-37.9, adult   3. Primary hypertension   4. Nonspecific abnormal electrocardiogram (ECG) (EKG)    PLAN:    In order of problems listed above:  Right bundle branch block, prior EKG with bifascicular block.  Denies dizziness, syncope.  Clinically asymptomatic.  No indication for pacemaker, no indication for additional testing at this point. Obesity, encouraged on current weight loss journey, continue healthy diet/low calorie diet. Hypertension, BP controlled with weight loss, continue to monitor off BP meds.  Follow-up as needed         Medication Adjustments/Labs and Tests Ordered: Current medicines are reviewed at length with the patient today.  Concerns regarding medicines are outlined above.  Orders Placed This Encounter  Procedures   EKG  12-Lead   No orders of the defined types were placed in this encounter.   Patient Instructions  Medication Instructions:   Your physician recommends that you continue on your current medications as directed. Please refer to the Current Medication list given to you today.   *If you need a refill on your cardiac medications before your next appointment, please call your pharmacy*   Lab Work: None ordered  If you have labs (blood work) drawn today and your tests are completely normal, you will receive your results only by: MyChart  Message (if you have MyChart) OR A paper copy in the mail If you have any lab test that is abnormal or we need to change your treatment, we will call you to review the results.   Testing/Procedures: None ordered   Follow-Up: At [facility], you and your health needs are our priority.  As part of our continuing mission to provide you with exceptional heart care, we have created designated Provider Care Teams.  These Care Teams include your primary Cardiologist (physician) and Advanced Practice Providers (APPs -  Physician Assistants and Nurse Practitioners) who all work together to provide you with the care you need, when you need it.  We recommend signing up for the patient portal called "MyChart".  Sign up information is provided on this After Visit Summary.  MyChart is used to connect with patients for Virtual Visits (Telemedicine).  Patients are able to view lab/test results, encounter notes, upcoming appointments, etc.  Non-urgent messages can be sent to your provider as well.   To learn more about what you can do with MyChart, go to [email or web address].    Your next appointment:   As needed  The format for your next appointment:   In Person  Provider:   You may see Kate Sable, MD or one of the following Advanced Practice Providers on your designated Care Team:   Murray Hodgkins, NP Christell Faith, PA-C Cadence Kathlen Mody, PA-C  :1}     Other Instructions N/A     Signed, Kate Sable, MD  04/10/2021 12:47 PM    [place]

## 2021-04-16 DIAGNOSIS — M1712 Unilateral primary osteoarthritis, left knee: Secondary | ICD-10-CM | POA: Diagnosis not present

## 2021-05-04 DIAGNOSIS — M4312 Spondylolisthesis, cervical region: Secondary | ICD-10-CM | POA: Diagnosis not present

## 2021-05-04 DIAGNOSIS — M47816 Spondylosis without myelopathy or radiculopathy, lumbar region: Secondary | ICD-10-CM | POA: Diagnosis not present

## 2021-05-04 DIAGNOSIS — M48062 Spinal stenosis, lumbar region with neurogenic claudication: Secondary | ICD-10-CM | POA: Diagnosis not present

## 2021-05-04 DIAGNOSIS — M47812 Spondylosis without myelopathy or radiculopathy, cervical region: Secondary | ICD-10-CM | POA: Diagnosis not present

## 2021-05-04 DIAGNOSIS — M47814 Spondylosis without myelopathy or radiculopathy, thoracic region: Secondary | ICD-10-CM | POA: Diagnosis not present

## 2021-05-14 DIAGNOSIS — M47816 Spondylosis without myelopathy or radiculopathy, lumbar region: Secondary | ICD-10-CM | POA: Diagnosis not present

## 2021-05-14 DIAGNOSIS — M48062 Spinal stenosis, lumbar region with neurogenic claudication: Secondary | ICD-10-CM | POA: Diagnosis not present

## 2021-05-15 DIAGNOSIS — Z9189 Other specified personal risk factors, not elsewhere classified: Secondary | ICD-10-CM | POA: Diagnosis not present

## 2021-05-15 DIAGNOSIS — I8390 Asymptomatic varicose veins of unspecified lower extremity: Secondary | ICD-10-CM | POA: Diagnosis not present

## 2021-05-15 DIAGNOSIS — Z01818 Encounter for other preprocedural examination: Secondary | ICD-10-CM | POA: Diagnosis not present

## 2021-05-27 DIAGNOSIS — M48061 Spinal stenosis, lumbar region without neurogenic claudication: Secondary | ICD-10-CM | POA: Diagnosis not present

## 2021-05-27 DIAGNOSIS — R93429 Abnormal radiologic findings on diagnostic imaging of unspecified kidney: Secondary | ICD-10-CM | POA: Diagnosis not present

## 2021-05-27 DIAGNOSIS — M79604 Pain in right leg: Secondary | ICD-10-CM | POA: Diagnosis not present

## 2021-05-27 DIAGNOSIS — M419 Scoliosis, unspecified: Secondary | ICD-10-CM | POA: Diagnosis not present

## 2021-06-01 ENCOUNTER — Telehealth: Payer: Self-pay | Admitting: Internal Medicine

## 2021-06-01 NOTE — Telephone Encounter (Signed)
Mickel Baas From [facility] called in stating that Pt has an abnormal finding on his kidneys. Mickel Baas stated that PA Lauren Dornhill requested laura to follow up with pcp about Pt. Mickel Baas stated that she will fax over the information. Mickel Baas requesting callback at [phone]

## 2021-06-02 DIAGNOSIS — M1712 Unilateral primary osteoarthritis, left knee: Secondary | ICD-10-CM | POA: Diagnosis not present

## 2021-06-02 DIAGNOSIS — R001 Bradycardia, unspecified: Secondary | ICD-10-CM | POA: Diagnosis not present

## 2021-06-02 DIAGNOSIS — Z79899 Other long term (current) drug therapy: Secondary | ICD-10-CM | POA: Diagnosis not present

## 2021-06-02 DIAGNOSIS — G4733 Obstructive sleep apnea (adult) (pediatric): Secondary | ICD-10-CM | POA: Diagnosis not present

## 2021-06-02 DIAGNOSIS — Z720 Tobacco use: Secondary | ICD-10-CM | POA: Diagnosis not present

## 2021-06-02 DIAGNOSIS — Z01818 Encounter for other preprocedural examination: Secondary | ICD-10-CM | POA: Diagnosis not present

## 2021-06-02 DIAGNOSIS — I452 Bifascicular block: Secondary | ICD-10-CM | POA: Diagnosis not present

## 2021-06-02 NOTE — Telephone Encounter (Signed)
As far as I know now there was not another study just the one and the office note. She did state that they referred patient to Nephrology at [facility]. If you prefer pt to be seen else where they stated that would be fine.

## 2021-06-02 NOTE — Telephone Encounter (Signed)
Spoke with Mickel Baas and she stated that she wanted to let you know that scan pt had for his back showed an incidental finding on his kidneys. She stated that they have spoken to the pt and he is asymptomatic. They have faxed over the office note and the results of scan but I have not seen them come through yet.

## 2021-06-14 DIAGNOSIS — Z20822 Contact with and (suspected) exposure to covid-19: Secondary | ICD-10-CM | POA: Diagnosis not present

## 2021-06-16 ENCOUNTER — Encounter: Payer: Self-pay | Admitting: Internal Medicine

## 2021-06-16 DIAGNOSIS — Z8249 Family history of ischemic heart disease and other diseases of the circulatory system: Secondary | ICD-10-CM | POA: Diagnosis not present

## 2021-06-16 DIAGNOSIS — G4733 Obstructive sleep apnea (adult) (pediatric): Secondary | ICD-10-CM | POA: Diagnosis not present

## 2021-06-16 DIAGNOSIS — G8918 Other acute postprocedural pain: Secondary | ICD-10-CM | POA: Diagnosis not present

## 2021-06-16 DIAGNOSIS — Z6836 Body mass index (BMI) 36.0-36.9, adult: Secondary | ICD-10-CM | POA: Diagnosis not present

## 2021-06-16 DIAGNOSIS — Z823 Family history of stroke: Secondary | ICD-10-CM | POA: Diagnosis not present

## 2021-06-16 DIAGNOSIS — F17290 Nicotine dependence, other tobacco product, uncomplicated: Secondary | ICD-10-CM | POA: Diagnosis not present

## 2021-06-16 DIAGNOSIS — M25762 Osteophyte, left knee: Secondary | ICD-10-CM | POA: Diagnosis not present

## 2021-06-16 DIAGNOSIS — M419 Scoliosis, unspecified: Secondary | ICD-10-CM | POA: Diagnosis not present

## 2021-06-16 DIAGNOSIS — M1712 Unilateral primary osteoarthritis, left knee: Secondary | ICD-10-CM | POA: Diagnosis not present

## 2021-06-16 DIAGNOSIS — Z96652 Presence of left artificial knee joint: Secondary | ICD-10-CM | POA: Insufficient documentation

## 2021-06-16 DIAGNOSIS — G8929 Other chronic pain: Secondary | ICD-10-CM | POA: Diagnosis not present

## 2021-06-17 DIAGNOSIS — G4733 Obstructive sleep apnea (adult) (pediatric): Secondary | ICD-10-CM | POA: Diagnosis not present

## 2021-06-17 DIAGNOSIS — Z823 Family history of stroke: Secondary | ICD-10-CM | POA: Diagnosis not present

## 2021-06-17 DIAGNOSIS — Z8249 Family history of ischemic heart disease and other diseases of the circulatory system: Secondary | ICD-10-CM | POA: Diagnosis not present

## 2021-06-17 DIAGNOSIS — M419 Scoliosis, unspecified: Secondary | ICD-10-CM | POA: Diagnosis not present

## 2021-06-17 DIAGNOSIS — G8918 Other acute postprocedural pain: Secondary | ICD-10-CM | POA: Diagnosis not present

## 2021-06-17 DIAGNOSIS — M1712 Unilateral primary osteoarthritis, left knee: Secondary | ICD-10-CM | POA: Diagnosis not present

## 2021-06-17 DIAGNOSIS — Z6836 Body mass index (BMI) 36.0-36.9, adult: Secondary | ICD-10-CM | POA: Diagnosis not present

## 2021-06-17 DIAGNOSIS — F17290 Nicotine dependence, other tobacco product, uncomplicated: Secondary | ICD-10-CM | POA: Diagnosis not present

## 2021-06-17 DIAGNOSIS — G8929 Other chronic pain: Secondary | ICD-10-CM | POA: Diagnosis not present

## 2021-06-20 ENCOUNTER — Encounter: Payer: Self-pay | Admitting: Internal Medicine

## 2021-06-30 DIAGNOSIS — Z96652 Presence of left artificial knee joint: Secondary | ICD-10-CM | POA: Diagnosis not present

## 2021-06-30 DIAGNOSIS — M25462 Effusion, left knee: Secondary | ICD-10-CM | POA: Diagnosis not present

## 2021-07-14 DIAGNOSIS — M25562 Pain in left knee: Secondary | ICD-10-CM | POA: Diagnosis not present

## 2021-07-14 DIAGNOSIS — M2141 Flat foot [pes planus] (acquired), right foot: Secondary | ICD-10-CM | POA: Diagnosis not present

## 2021-07-14 DIAGNOSIS — M19071 Primary osteoarthritis, right ankle and foot: Secondary | ICD-10-CM | POA: Diagnosis not present

## 2021-07-14 DIAGNOSIS — Z96652 Presence of left artificial knee joint: Secondary | ICD-10-CM | POA: Diagnosis not present

## 2021-08-10 ENCOUNTER — Telehealth: Payer: Self-pay | Admitting: Internal Medicine

## 2021-08-10 DIAGNOSIS — Z1211 Encounter for screening for malignant neoplasm of colon: Secondary | ICD-10-CM

## 2021-08-10 NOTE — Telephone Encounter (Signed)
Referral has been pended.

## 2021-08-10 NOTE — Telephone Encounter (Signed)
Pt called in stating that Dr. Derrel Nip requested him to do an colonoscopy... Pt stated he is willing to do the colonoscopy... Pt requesting for a referral... Pt requesting a callback ?

## 2021-08-12 ENCOUNTER — Other Ambulatory Visit: Payer: Self-pay

## 2021-08-12 DIAGNOSIS — Z1211 Encounter for screening for malignant neoplasm of colon: Secondary | ICD-10-CM

## 2021-08-12 MED ORDER — NA SULFATE-K SULFATE-MG SULF 17.5-3.13-1.6 GM/177ML PO SOLN: 1.0000 | Freq: Once | ORAL | 0 refills | Status: AC

## 2021-08-12 NOTE — Progress Notes (Signed)
Gastroenterology Pre-Procedure Review ? ?Request Date: Thursday 08/27/21 ?Requesting Physician: Dr. Allen Norris ? ?PATIENT REVIEW QUESTIONS: The patient responded to the following health history questions as indicated:   ? ?1. Are you having any GI issues? no ?2. Do you have a personal history of Polyps? no ?3. Do you have a family history of Colon Cancer or Polyps? no ?4. Diabetes Mellitus? no ?5. Joint replacements in the past 12 months?yes (knee replacement left Feb 21st) ?6. Major health problems in the past 3 months?no ?7. Any artificial heart valves, MVP, or defibrillator?no ?   ?MEDICATIONS & ALLERGIES:    ?Patient reports the following regarding taking any anticoagulation/antiplatelet therapy:   ?Plavix, Coumadin, Eliquis, Xarelto, Lovenox, Pradaxa, Brilinta, or Effient? no ?Aspirin? no ? ?Patient confirms/reports the following medications:  ?Current Outpatient Medications  ?Medication Sig Dispense Refill  ? cyanocobalamin (,VITAMIN B-12,) 1000 MCG/ML injection Inject 1 mL (1,000 mcg total) into the muscle once a week. For 3 weeks,  Then monthly 1 mL 11  ? diclofenac (VOLTAREN) 75 MG EC tablet TAKE 1 TABLET BY MOUTH 2 TIMES DAILY. 180 tablet 1  ? pantoprazole (PROTONIX) 20 MG tablet TAKE ONE TABLET EVERY DAY 90 tablet 1  ? Syringe/Needle, Disp, (SYRINGE 3CC/25GX1") 25G X 1" 3 ML MISC Use for b12 injections 50 each 0  ? Vitamin D, Ergocalciferol, (DRISDOL) 1.25 MG (50000 UNIT) CAPS capsule Take 1 capsule (50,000 Units total) by mouth every 7 (seven) days. 12 capsule 3  ? ?No current facility-administered medications for this visit.  ? ? ?Patient confirms/reports the following allergies:  ?No Known Allergies ? ?No orders of the defined types were placed in this encounter. ? ? ?AUTHORIZATION INFORMATION ?Primary Insurance: ?1D#: ?Group #: ? ?Secondary Insurance: ?1D#: ?Group #: ? ?SCHEDULE INFORMATION: ?Date: 08/27/21 ?Time: ?Location: [place] ?

## 2021-08-17 DIAGNOSIS — Z01818 Encounter for other preprocedural examination: Secondary | ICD-10-CM | POA: Diagnosis not present

## 2021-08-17 DIAGNOSIS — G4733 Obstructive sleep apnea (adult) (pediatric): Secondary | ICD-10-CM | POA: Diagnosis not present

## 2021-08-18 ENCOUNTER — Other Ambulatory Visit: Payer: Self-pay

## 2021-08-18 ENCOUNTER — Encounter: Payer: Self-pay | Admitting: Gastroenterology

## 2021-08-18 DIAGNOSIS — Z1211 Encounter for screening for malignant neoplasm of colon: Secondary | ICD-10-CM

## 2021-08-18 MED ORDER — NA SULFATE-K SULFATE-MG SULF 17.5-3.13-1.6 GM/177ML PO SOLN: 1.0000 | Freq: Once | ORAL | 0 refills | Status: AC

## 2021-08-27 ENCOUNTER — Ambulatory Visit
Admission: RE | Admit: 2021-08-27 | Discharge: 2021-08-27 | Disposition: A | Discharge status: Home / Self Care | Payer: BC Managed Care – PPO | Attending: Gastroenterology | Admitting: Gastroenterology

## 2021-08-27 ENCOUNTER — Encounter: Payer: Self-pay | Admitting: Gastroenterology

## 2021-08-27 ENCOUNTER — Other Ambulatory Visit: Payer: Self-pay

## 2021-08-27 ENCOUNTER — Encounter
Admission: RE | Disposition: A | Discharge status: Home / Self Care | Payer: Self-pay | Source: Home / Self Care | Attending: Gastroenterology

## 2021-08-27 ENCOUNTER — Ambulatory Visit: Payer: BC Managed Care – PPO | Admitting: Anesthesiology

## 2021-08-27 DIAGNOSIS — D123 Benign neoplasm of transverse colon: Secondary | ICD-10-CM | POA: Insufficient documentation

## 2021-08-27 DIAGNOSIS — G709 Myoneural disorder, unspecified: Secondary | ICD-10-CM | POA: Insufficient documentation

## 2021-08-27 DIAGNOSIS — G4733 Obstructive sleep apnea (adult) (pediatric): Secondary | ICD-10-CM | POA: Diagnosis not present

## 2021-08-27 DIAGNOSIS — D122 Benign neoplasm of ascending colon: Secondary | ICD-10-CM | POA: Diagnosis not present

## 2021-08-27 DIAGNOSIS — E663 Overweight: Secondary | ICD-10-CM | POA: Insufficient documentation

## 2021-08-27 DIAGNOSIS — I10 Essential (primary) hypertension: Secondary | ICD-10-CM | POA: Insufficient documentation

## 2021-08-27 DIAGNOSIS — K640 First degree hemorrhoids: Secondary | ICD-10-CM | POA: Diagnosis not present

## 2021-08-27 DIAGNOSIS — K5730 Diverticulosis of large intestine without perforation or abscess without bleeding: Secondary | ICD-10-CM | POA: Diagnosis not present

## 2021-08-27 DIAGNOSIS — D126 Benign neoplasm of colon, unspecified: Secondary | ICD-10-CM | POA: Diagnosis not present

## 2021-08-27 DIAGNOSIS — K635 Polyp of colon: Secondary | ICD-10-CM | POA: Diagnosis not present

## 2021-08-27 DIAGNOSIS — Z6833 Body mass index (BMI) 33.0-33.9, adult: Secondary | ICD-10-CM | POA: Diagnosis not present

## 2021-08-27 DIAGNOSIS — Z1211 Encounter for screening for malignant neoplasm of colon: Secondary | ICD-10-CM | POA: Diagnosis not present

## 2021-08-27 HISTORY — DX: Presence of left artificial knee joint: Z96.652

## 2021-08-27 HISTORY — PX: COLONOSCOPY WITH PROPOFOL: SHX5780

## 2021-08-27 HISTORY — PX: POLYPECTOMY: SHX5525

## 2021-08-27 SURGERY — COLONOSCOPY WITH PROPOFOL
Anesthesia: General | Site: Rectum

## 2021-08-27 MED ORDER — STERILE WATER FOR IRRIGATION IR SOLN
Status: DC | PRN
Start: 2021-08-27 — End: 2021-08-27
  Administered 2021-08-27: 1

## 2021-08-27 MED ORDER — PROPOFOL 10 MG/ML IV BOLUS
INTRAVENOUS | Status: DC | PRN
  Administered 2021-08-27: 30 mg via INTRAVENOUS
  Administered 2021-08-27: 20 mg via INTRAVENOUS
  Administered 2021-08-27: 80 mg via INTRAVENOUS
  Administered 2021-08-27: 20 mg via INTRAVENOUS
  Administered 2021-08-27 (×5): 30 mg via INTRAVENOUS
  Administered 2021-08-27: 20 mg via INTRAVENOUS
  Administered 2021-08-27: 30 mg via INTRAVENOUS
  Administered 2021-08-27: 20 mg via INTRAVENOUS

## 2021-08-27 MED ORDER — LIDOCAINE HCL (CARDIAC) PF 100 MG/5ML IV SOSY
PREFILLED_SYRINGE | INTRAVENOUS | Status: DC | PRN
  Administered 2021-08-27: 50 mg via INTRAVENOUS

## 2021-08-27 MED ORDER — LACTATED RINGERS IV SOLN: INTRAVENOUS | Status: DC

## 2021-08-27 MED ORDER — SODIUM CHLORIDE 0.9 % IV SOLN: INTRAVENOUS | Status: DC

## 2021-08-27 SURGICAL SUPPLY — 22 items

## 2021-08-27 NOTE — Anesthesia Procedure Notes (Signed)
Date/Time: 08/27/2021 10:04 AM ?Performed by: Mayme Genta, CRNA ?Pre-anesthesia Checklist: Patient identified, Emergency Drugs available, Suction available, Timeout performed and Patient being monitored ?Patient Re-evaluated:Patient Re-evaluated prior to induction ?Oxygen Delivery Method: Nasal cannula ?Placement Confirmation: positive ETCO2 ? ? ? ? ?

## 2021-08-27 NOTE — Anesthesia Postprocedure Evaluation (Signed)
Anesthesia Post Note ? ?Patient: John Wolf ? ?Procedure(s) Performed: COLONOSCOPY WITH PROPOFOL (Rectum) ?POLYPECTOMY (Rectum) ? ? ?  ?Patient location during evaluation: PACU ?Anesthesia Type: General ?Level of consciousness: awake and alert and oriented ?Pain management: satisfactory to patient ?Vital Signs Assessment: post-procedure vital signs reviewed and stable ?Respiratory status: spontaneous breathing, nonlabored ventilation and respiratory function stable ?Cardiovascular status: blood pressure returned to baseline and stable ?Postop Assessment: Adequate PO intake and No signs of nausea or vomiting ?Anesthetic complications: no ? ? ?No notable events documented. ? ?Raliegh Ip ? ? ? ? ? ?

## 2021-08-27 NOTE — Transfer of Care (Signed)
Immediate Anesthesia Transfer of Care Note ? ?Patient: John Wolf ? ?Procedure(s) Performed: COLONOSCOPY WITH PROPOFOL (Rectum) ?POLYPECTOMY (Rectum) ? ?Patient Location: PACU ? ?Anesthesia Type: General ? ?Level of Consciousness: awake, alert  and patient cooperative ? ?Airway and Oxygen Therapy: Patient Spontanous Breathing and Patient connected to supplemental oxygen ? ?Post-op Assessment: Post-op Vital signs reviewed, Patient's Cardiovascular Status Stable, Respiratory Function Stable, Patent Airway and No signs of Nausea or vomiting ? ?Post-op Vital Signs: Reviewed and stable ? ?Complications: No notable events documented. ? ?

## 2021-08-27 NOTE — Op Note (Signed)
[facility] ?Gastroenterology ?Patient Name: John Wolf ?Procedure Date: 08/27/2021 9:59 AM ?MRN: [id number] ?Account #: [id number] ?Date of Birth: [birth year] ?Admit Type: Outpatient ?Age: 66 ?Room: [facility] OR ROOM 01 ?Gender: Male ?Note Status: Finalized ?Instrument Name: [id number] ?Procedure:             Colonoscopy ?Indications:           Screening for colorectal malignant neoplasm ?Providers:             [name] MD, MD ?Referring MD:          Deborra Medina, MD (Referring MD) ?Medicines:             Propofol per Anesthesia ?Complications:         No immediate complications. ?Procedure:             Pre-Anesthesia Assessment: ?                       - Prior to the procedure, a History and Physical was  ?                       performed, and patient medications and allergies were  ?                       reviewed. The patient's tolerance of previous  ?                       anesthesia was also reviewed. The risks and benefits  ?                       of the procedure and the sedation options and risks  ?                       were discussed with the patient. All questions were  ?                       answered, and informed consent was obtained. Prior  ?                       Anticoagulants: The patient has taken no previous  ?                       anticoagulant or antiplatelet agents. ASA Grade  ?                       Assessment: II - A patient with mild systemic disease.  ?                       After reviewing the risks and benefits, the patient  ?                       was deemed in satisfactory condition to undergo the  ?                       procedure. ?                       After obtaining informed consent, the colonoscope was  ?  passed under direct vision. Throughout the procedure,  ?                       the patient's blood pressure, pulse, and oxygen  ?                       saturations were monitored continuously. The  ?                       Colonoscope was  introduced through the anus and  ?                       advanced to the the cecum, identified by appendiceal  ?                       orifice and ileocecal valve. The colonoscopy was  ?                       performed without difficulty. The patient tolerated  ?                       the procedure well. The quality of the bowel  ?                       preparation was excellent. ?Findings: ?     The perianal and digital rectal examinations were normal. ?     A 4 mm polyp was found in the ascending colon. The polyp was sessile.  ?     The polyp was removed with a cold snare. Resection and retrieval were  ?     complete. ?     Multiple small-mouthed diverticula were found in the sigmoid colon. ?     Two sessile polyps were found in the transverse colon. The polyps were 2  ?     to 4 mm in size. These polyps were removed with a cold snare. Resection  ?     and retrieval were complete. ?     Non-bleeding internal hemorrhoids were found during retroflexion. The  ?     hemorrhoids were Grade I (internal hemorrhoids that do not prolapse). ?Impression:            - One 4 mm polyp in the ascending colon, removed with  ?                       a cold snare. Resected and retrieved. ?                       - Diverticulosis in the sigmoid colon. ?                       - Two 2 to 4 mm polyps in the transverse colon,  ?                       removed with a cold snare. Resected and retrieved. ?                       - Non-bleeding internal hemorrhoids. ?Recommendation:        - Discharge patient to home. ?                       -  Resume previous diet. ?                       - Continue present medications. ?                       - Await pathology results. ?                       - If the pathology report reveals adenomatous tissue,  ?                       then repeat the colonoscopy for surveillance in 7  ?                       years. ?Procedure Code(s):     --- Professional --- ?                       [id number], Colonoscopy,  flexible; with removal of  ?                       tumor(s), polyp(s), or other lesion(s) by snare  ?                       technique ?Diagnosis Code(s):     --- Professional --- ?                       Z12.11, Encounter for screening for malignant neoplasm  ?                       of colon ?                       K63.5, Polyp of colon ?CPT copyright 2019 American Medical Association. All rights reserved. ?The codes documented in this report are preliminary and upon coder review may  ?be revised to meet current compliance requirements. ?[name] MD, MD ?08/27/2021 10:21:42 AM ?This report has been signed electronically. ?Number of Addenda: 0 ?Note Initiated On: 08/27/2021 9:59 AM ?Scope Withdrawal Time: 0 hours 10 minutes 13 seconds  ?Total Procedure Duration: 0 hours 12 minutes 14 seconds  ?Estimated Blood Loss:  Estimated blood loss: none. ?     [facility] ?

## 2021-08-27 NOTE — H&P (Signed)
? ?John Lame, MD [facility] ?[address] ?[place], [place] [address] ?Phone: [phone] ?Fax : [phone] ? ?Primary Care Physician:  John Mc, MD ?Primary Gastroenterologist:  Dr. Allen Wolf ? ?Pre-Procedure History & Physical: ?HPI:  John Wolf is a 66 y.o. male is here for a screening colonoscopy.  ? ?Past Medical History:  ?Diagnosis Date  ? Acquired pes planus   ? Arthritis   ? Benign neoplasm of colon   ? History of left knee replacement   ? Hyperlipidemia   ? Hypertension   ? Obesity (BMI 30-39.9)   ? with 70 lb weight loss, ongoing  ? Onychia and paronychia of toe   ? Sleep apnea, obstructive 04/26/2000  ? wears CPAP  ? ? ?Past Surgical History:  ?Procedure Laterality Date  ? left shoulder    ? shoulder dislocation, rotator   ? ? ?Prior to Admission medications   ?Medication Sig Start Date End Date Taking? Authorizing Provider  ?diclofenac (VOLTAREN) 75 MG EC tablet TAKE 1 TABLET BY MOUTH 2 TIMES DAILY. 10/13/20  Yes John Haven, MD  ?pantoprazole (PROTONIX) 20 MG tablet TAKE ONE TABLET EVERY DAY 01/28/21  Yes John Mc, MD  ?cyanocobalamin (,VITAMIN B-12,) 1000 MCG/ML injection Inject 1 mL (1,000 mcg total) into the muscle once a week. For 3 weeks,  Then monthly ?Patient not taking: Reported on 08/18/2021 08/29/19   John Mc, MD  ?Syringe/Needle, Disp, (SYRINGE 3CC/25GX1") 25G X 1" 3 ML MISC Use for b12 injections 08/29/19   John Mc, MD  ?Vitamin D, Ergocalciferol, (DRISDOL) 1.25 MG (50000 UNIT) CAPS capsule Take 1 capsule (50,000 Units total) by mouth every 7 (seven) days. ?Patient not taking: Reported on 08/18/2021 10/21/20   John Mc, MD  ? ? ?Allergies as of 08/12/2021  ? (No Known Allergies)  ? ? ?Family History  ?Problem Relation Age of Onset  ? Heart disease Mother   ? Stroke Father   ? Stroke Brother   ? Hypertension Brother   ? Cancer Brother 12  ?     AML  ? ? ?Social History  ? ?Socioeconomic History  ? Marital status: Married  ?  Spouse name: Not on  file  ? Number of children: 1  ? Years of education: Not on file  ? Highest education level: Not on file  ?Occupational History  ? Occupation: [employer]  ?Tobacco Use  ? Smoking status: Never  ? Smokeless tobacco: Never  ?Vaping Use  ? Vaping Use: Never used  ?Substance and Sexual Activity  ? Alcohol use: Yes  ?  Comment: occasionally  ? Drug use: Never  ? Sexual activity: Not on file  ?Other Topics Concern  ? Not on file  ?Social History Narrative  ? Right handed  ? One story home  ? Coffee q am  ? ?Social Determinants of Health  ? ?Financial Resource Strain: Not on file  ?Food Insecurity: Not on file  ?Transportation Needs: Not on file  ?Physical Activity: Not on file  ?Stress: Not on file  ?Social Connections: Not on file  ?Intimate Partner Violence: Not on file  ? ? ?Review of Systems: ?See HPI, otherwise negative ROS ? ?Physical Exam: ?BP (!) 161/79   Pulse 63   Temp 98.6 ?F (37 ?C) (Temporal)   Resp 16   Ht '5\' 7"'$  (1.702 m)   Wt 97.5 kg   SpO2 98%   BMI 33.67 kg/m?  ?General:   Alert,  pleasant and  cooperative in NAD ?Head:  Normocephalic and atraumatic. ?Neck:  Supple; no masses or thyromegaly. ?Lungs:  Clear throughout to auscultation.    ?Heart:  Regular rate and rhythm. ?Abdomen:  Soft, nontender and nondistended. Normal bowel sounds, without guarding, and without rebound.   ?Neurologic:  Alert and  oriented x4;  grossly normal neurologically. ? ?Impression/Plan: ?John Wolf is now here to undergo a screening colonoscopy. ? ?Risks, benefits, and alternatives regarding colonoscopy have been reviewed with the patient.  Questions have been answered.  All parties agreeable. ?

## 2021-08-27 NOTE — Anesthesia Preprocedure Evaluation (Signed)
Anesthesia Evaluation  ?Patient identified by MRN, date of birth, ID band ?Patient awake ? ? ? ?Reviewed: ?Allergy & Precautions, H&P , NPO status , Patient's Chart, lab work & pertinent test results ? ?Airway ?Mallampati: II ? ?TM Distance: >3 FB ?Neck ROM: full ? ? ? Dental ?no notable dental hx. ? ?  ?Pulmonary ?sleep apnea and Continuous Positive Airway Pressure Ventilation ,  ?  ?Pulmonary exam normal ?breath sounds clear to auscultation ? ? ? ? ? ? Cardiovascular ?hypertension, Normal cardiovascular exam ?Rhythm:regular Rate:Normal ? ? ?  ?Neuro/Psych ? Neuromuscular disease   ? GI/Hepatic ?  ?Endo/Other  ?overweight ? Renal/GU ?  ? ?  ?Musculoskeletal ? ? Abdominal ?  ?Peds ? Hematology ?  ?Anesthesia Other Findings ? ? Reproductive/Obstetrics ? ?  ? ? ? ? ? ? ? ? ? ? ? ? ? ?  ?  ? ? ? ? ? ? ? ? ?Anesthesia Physical ?Anesthesia Plan ? ?ASA: 2 ? ?Anesthesia Plan: General  ? ?Post-op Pain Management: Minimal or no pain anticipated  ? ?Induction: Intravenous ? ?PONV Risk Score and Plan: 2 and Treatment may vary due to age or medical condition, Propofol infusion and TIVA ? ?Airway Management Planned: Natural Airway ? ?Additional Equipment:  ? ?Intra-op Plan:  ? ?Post-operative Plan:  ? ?Informed Consent: I have reviewed the patients History and Physical, chart, labs and discussed the procedure including the risks, benefits and alternatives for the proposed anesthesia with the patient or authorized representative who has indicated his/her understanding and acceptance.  ? ? ? ?Dental Advisory Given ? ?Plan Discussed with: CRNA ? ?Anesthesia Plan Comments:   ? ? ? ? ? ? ?Anesthesia Quick Evaluation ? ?

## 2021-08-28 ENCOUNTER — Encounter: Payer: Self-pay | Admitting: Gastroenterology

## 2021-08-28 LAB — SURGICAL PATHOLOGY

## 2021-09-14 DIAGNOSIS — G4733 Obstructive sleep apnea (adult) (pediatric): Secondary | ICD-10-CM | POA: Diagnosis not present

## 2021-09-24 DIAGNOSIS — N1330 Unspecified hydronephrosis: Secondary | ICD-10-CM | POA: Diagnosis not present

## 2021-09-26 DIAGNOSIS — G4733 Obstructive sleep apnea (adult) (pediatric): Secondary | ICD-10-CM | POA: Diagnosis not present

## 2021-10-01 DIAGNOSIS — M419 Scoliosis, unspecified: Secondary | ICD-10-CM | POA: Diagnosis not present

## 2021-10-01 DIAGNOSIS — M48061 Spinal stenosis, lumbar region without neurogenic claudication: Secondary | ICD-10-CM | POA: Diagnosis not present

## 2021-10-01 DIAGNOSIS — M48062 Spinal stenosis, lumbar region with neurogenic claudication: Secondary | ICD-10-CM | POA: Diagnosis not present

## 2021-10-01 DIAGNOSIS — N1330 Unspecified hydronephrosis: Secondary | ICD-10-CM | POA: Diagnosis not present

## 2021-10-01 DIAGNOSIS — M7631 Iliotibial band syndrome, right leg: Secondary | ICD-10-CM | POA: Diagnosis not present

## 2021-10-07 DIAGNOSIS — M79604 Pain in right leg: Secondary | ICD-10-CM | POA: Diagnosis not present

## 2021-10-07 DIAGNOSIS — R262 Difficulty in walking, not elsewhere classified: Secondary | ICD-10-CM | POA: Diagnosis not present

## 2021-10-21 ENCOUNTER — Other Ambulatory Visit: Payer: Self-pay | Admitting: Internal Medicine

## 2021-10-21 MED ORDER — PREDNISONE 10 MG PO TABS: ORAL_TABLET | ORAL | 0 refills | Status: DC

## 2021-10-21 MED ORDER — TRAMADOL HCL 50 MG PO TABS: 50.0000 mg | ORAL_TABLET | Freq: Four times a day (QID) | ORAL | 0 refills | Status: AC | PRN

## 2021-10-22 DIAGNOSIS — M25512 Pain in left shoulder: Secondary | ICD-10-CM | POA: Diagnosis not present

## 2021-10-22 DIAGNOSIS — M13812 Other specified arthritis, left shoulder: Secondary | ICD-10-CM | POA: Diagnosis not present

## 2021-10-26 DIAGNOSIS — G4733 Obstructive sleep apnea (adult) (pediatric): Secondary | ICD-10-CM | POA: Diagnosis not present

## 2021-11-02 DIAGNOSIS — G4733 Obstructive sleep apnea (adult) (pediatric): Secondary | ICD-10-CM | POA: Diagnosis not present

## 2021-11-02 DIAGNOSIS — R262 Difficulty in walking, not elsewhere classified: Secondary | ICD-10-CM | POA: Diagnosis not present

## 2021-11-02 DIAGNOSIS — M79604 Pain in right leg: Secondary | ICD-10-CM | POA: Diagnosis not present

## 2021-11-04 ENCOUNTER — Other Ambulatory Visit: Payer: Self-pay | Admitting: Internal Medicine

## 2021-11-04 MED ORDER — PREDNISONE 10 MG PO TABS: ORAL_TABLET | ORAL | 0 refills | Status: DC

## 2021-11-16 DIAGNOSIS — G8929 Other chronic pain: Secondary | ICD-10-CM | POA: Diagnosis not present

## 2021-11-16 DIAGNOSIS — M19012 Primary osteoarthritis, left shoulder: Secondary | ICD-10-CM | POA: Diagnosis not present

## 2021-11-16 DIAGNOSIS — M19112 Post-traumatic osteoarthritis, left shoulder: Secondary | ICD-10-CM | POA: Diagnosis not present

## 2021-11-16 DIAGNOSIS — M898X2 Other specified disorders of bone, upper arm: Secondary | ICD-10-CM | POA: Diagnosis not present

## 2021-11-16 DIAGNOSIS — M25512 Pain in left shoulder: Secondary | ICD-10-CM | POA: Diagnosis not present

## 2021-11-16 DIAGNOSIS — M25562 Pain in left knee: Secondary | ICD-10-CM | POA: Diagnosis not present

## 2021-11-17 DIAGNOSIS — Z01818 Encounter for other preprocedural examination: Secondary | ICD-10-CM | POA: Diagnosis not present

## 2021-11-17 DIAGNOSIS — M2141 Flat foot [pes planus] (acquired), right foot: Secondary | ICD-10-CM | POA: Diagnosis not present

## 2021-11-17 DIAGNOSIS — F1090 Alcohol use, unspecified, uncomplicated: Secondary | ICD-10-CM | POA: Diagnosis not present

## 2021-11-17 DIAGNOSIS — M2142 Flat foot [pes planus] (acquired), left foot: Secondary | ICD-10-CM | POA: Diagnosis not present

## 2021-11-17 DIAGNOSIS — G4733 Obstructive sleep apnea (adult) (pediatric): Secondary | ICD-10-CM | POA: Diagnosis not present

## 2021-11-17 DIAGNOSIS — I951 Orthostatic hypotension: Secondary | ICD-10-CM | POA: Diagnosis not present

## 2021-11-17 DIAGNOSIS — Z96652 Presence of left artificial knee joint: Secondary | ICD-10-CM | POA: Diagnosis not present

## 2021-11-26 DIAGNOSIS — G4733 Obstructive sleep apnea (adult) (pediatric): Secondary | ICD-10-CM | POA: Diagnosis not present

## 2021-11-27 DIAGNOSIS — R262 Difficulty in walking, not elsewhere classified: Secondary | ICD-10-CM | POA: Diagnosis not present

## 2021-11-27 DIAGNOSIS — M79604 Pain in right leg: Secondary | ICD-10-CM | POA: Diagnosis not present

## 2021-12-03 DIAGNOSIS — G4733 Obstructive sleep apnea (adult) (pediatric): Secondary | ICD-10-CM | POA: Diagnosis not present

## 2021-12-08 DIAGNOSIS — G4733 Obstructive sleep apnea (adult) (pediatric): Secondary | ICD-10-CM | POA: Diagnosis not present

## 2021-12-15 DIAGNOSIS — G4733 Obstructive sleep apnea (adult) (pediatric): Secondary | ICD-10-CM | POA: Diagnosis not present

## 2021-12-15 DIAGNOSIS — Z9989 Dependence on other enabling machines and devices: Secondary | ICD-10-CM | POA: Diagnosis not present

## 2021-12-16 DIAGNOSIS — G8918 Other acute postprocedural pain: Secondary | ICD-10-CM | POA: Diagnosis not present

## 2021-12-16 DIAGNOSIS — M2142 Flat foot [pes planus] (acquired), left foot: Secondary | ICD-10-CM | POA: Diagnosis not present

## 2021-12-16 DIAGNOSIS — F1020 Alcohol dependence, uncomplicated: Secondary | ICD-10-CM | POA: Diagnosis not present

## 2021-12-16 DIAGNOSIS — M2141 Flat foot [pes planus] (acquired), right foot: Secondary | ICD-10-CM | POA: Diagnosis not present

## 2021-12-16 DIAGNOSIS — Z79899 Other long term (current) drug therapy: Secondary | ICD-10-CM | POA: Diagnosis not present

## 2021-12-16 DIAGNOSIS — G4733 Obstructive sleep apnea (adult) (pediatric): Secondary | ICD-10-CM | POA: Diagnosis not present

## 2021-12-16 DIAGNOSIS — F1090 Alcohol use, unspecified, uncomplicated: Secondary | ICD-10-CM | POA: Diagnosis not present

## 2021-12-16 DIAGNOSIS — Z720 Tobacco use: Secondary | ICD-10-CM | POA: Diagnosis not present

## 2021-12-16 DIAGNOSIS — G4730 Sleep apnea, unspecified: Secondary | ICD-10-CM | POA: Diagnosis not present

## 2021-12-16 DIAGNOSIS — Z6837 Body mass index (BMI) 37.0-37.9, adult: Secondary | ICD-10-CM | POA: Diagnosis not present

## 2021-12-16 DIAGNOSIS — M419 Scoliosis, unspecified: Secondary | ICD-10-CM | POA: Diagnosis not present

## 2021-12-16 DIAGNOSIS — Z791 Long term (current) use of non-steroidal anti-inflammatories (NSAID): Secondary | ICD-10-CM | POA: Diagnosis not present

## 2021-12-16 DIAGNOSIS — I951 Orthostatic hypotension: Secondary | ICD-10-CM | POA: Diagnosis not present

## 2021-12-16 DIAGNOSIS — M5412 Radiculopathy, cervical region: Secondary | ICD-10-CM | POA: Diagnosis not present

## 2021-12-16 DIAGNOSIS — I10 Essential (primary) hypertension: Secondary | ICD-10-CM | POA: Diagnosis not present

## 2021-12-16 DIAGNOSIS — E669 Obesity, unspecified: Secondary | ICD-10-CM | POA: Diagnosis not present

## 2021-12-17 DIAGNOSIS — Z6837 Body mass index (BMI) 37.0-37.9, adult: Secondary | ICD-10-CM | POA: Diagnosis not present

## 2021-12-17 DIAGNOSIS — M419 Scoliosis, unspecified: Secondary | ICD-10-CM | POA: Diagnosis not present

## 2021-12-17 DIAGNOSIS — M2142 Flat foot [pes planus] (acquired), left foot: Secondary | ICD-10-CM | POA: Diagnosis not present

## 2021-12-17 DIAGNOSIS — F1020 Alcohol dependence, uncomplicated: Secondary | ICD-10-CM | POA: Diagnosis not present

## 2021-12-17 DIAGNOSIS — I10 Essential (primary) hypertension: Secondary | ICD-10-CM | POA: Diagnosis not present

## 2021-12-17 DIAGNOSIS — I951 Orthostatic hypotension: Secondary | ICD-10-CM | POA: Diagnosis not present

## 2021-12-17 DIAGNOSIS — E669 Obesity, unspecified: Secondary | ICD-10-CM | POA: Diagnosis not present

## 2021-12-17 DIAGNOSIS — G4733 Obstructive sleep apnea (adult) (pediatric): Secondary | ICD-10-CM | POA: Diagnosis not present

## 2021-12-17 DIAGNOSIS — Z791 Long term (current) use of non-steroidal anti-inflammatories (NSAID): Secondary | ICD-10-CM | POA: Diagnosis not present

## 2021-12-17 DIAGNOSIS — Z79899 Other long term (current) drug therapy: Secondary | ICD-10-CM | POA: Diagnosis not present

## 2021-12-17 DIAGNOSIS — M2141 Flat foot [pes planus] (acquired), right foot: Secondary | ICD-10-CM | POA: Diagnosis not present

## 2021-12-17 DIAGNOSIS — F1090 Alcohol use, unspecified, uncomplicated: Secondary | ICD-10-CM | POA: Diagnosis not present

## 2021-12-22 DIAGNOSIS — M7731 Calcaneal spur, right foot: Secondary | ICD-10-CM | POA: Diagnosis not present

## 2021-12-22 DIAGNOSIS — M2141 Flat foot [pes planus] (acquired), right foot: Secondary | ICD-10-CM | POA: Diagnosis not present

## 2021-12-22 DIAGNOSIS — M19071 Primary osteoarthritis, right ankle and foot: Secondary | ICD-10-CM | POA: Diagnosis not present

## 2021-12-27 DIAGNOSIS — G4733 Obstructive sleep apnea (adult) (pediatric): Secondary | ICD-10-CM | POA: Diagnosis not present

## 2021-12-29 DIAGNOSIS — Z4789 Encounter for other orthopedic aftercare: Secondary | ICD-10-CM | POA: Diagnosis not present

## 2021-12-29 DIAGNOSIS — M2141 Flat foot [pes planus] (acquired), right foot: Secondary | ICD-10-CM | POA: Diagnosis not present

## 2022-01-03 DIAGNOSIS — G4733 Obstructive sleep apnea (adult) (pediatric): Secondary | ICD-10-CM | POA: Diagnosis not present

## 2022-01-04 DIAGNOSIS — M2141 Flat foot [pes planus] (acquired), right foot: Secondary | ICD-10-CM | POA: Diagnosis not present

## 2022-01-04 DIAGNOSIS — Z4789 Encounter for other orthopedic aftercare: Secondary | ICD-10-CM | POA: Diagnosis not present

## 2022-01-07 DIAGNOSIS — M48061 Spinal stenosis, lumbar region without neurogenic claudication: Secondary | ICD-10-CM | POA: Diagnosis not present

## 2022-01-07 DIAGNOSIS — M419 Scoliosis, unspecified: Secondary | ICD-10-CM | POA: Diagnosis not present

## 2022-01-07 DIAGNOSIS — M7631 Iliotibial band syndrome, right leg: Secondary | ICD-10-CM | POA: Diagnosis not present

## 2022-01-07 DIAGNOSIS — M79604 Pain in right leg: Secondary | ICD-10-CM | POA: Diagnosis not present

## 2022-01-08 DIAGNOSIS — G4733 Obstructive sleep apnea (adult) (pediatric): Secondary | ICD-10-CM | POA: Diagnosis not present

## 2022-01-26 DIAGNOSIS — M2141 Flat foot [pes planus] (acquired), right foot: Secondary | ICD-10-CM | POA: Diagnosis not present

## 2022-02-07 DIAGNOSIS — G4733 Obstructive sleep apnea (adult) (pediatric): Secondary | ICD-10-CM | POA: Diagnosis not present

## 2022-02-16 DIAGNOSIS — Z4789 Encounter for other orthopedic aftercare: Secondary | ICD-10-CM | POA: Diagnosis not present

## 2022-02-22 ENCOUNTER — Encounter (INDEPENDENT_AMBULATORY_CARE_PROVIDER_SITE_OTHER): Payer: Self-pay

## 2022-02-23 DIAGNOSIS — Z4789 Encounter for other orthopedic aftercare: Secondary | ICD-10-CM | POA: Diagnosis not present

## 2022-02-23 DIAGNOSIS — M2141 Flat foot [pes planus] (acquired), right foot: Secondary | ICD-10-CM | POA: Diagnosis not present

## 2022-03-01 DIAGNOSIS — M25512 Pain in left shoulder: Secondary | ICD-10-CM | POA: Diagnosis not present

## 2022-03-01 DIAGNOSIS — M19112 Post-traumatic osteoarthritis, left shoulder: Secondary | ICD-10-CM | POA: Diagnosis not present

## 2022-03-01 DIAGNOSIS — G8929 Other chronic pain: Secondary | ICD-10-CM | POA: Diagnosis not present

## 2022-03-09 DIAGNOSIS — M19071 Primary osteoarthritis, right ankle and foot: Secondary | ICD-10-CM | POA: Diagnosis not present

## 2022-03-09 DIAGNOSIS — M85871 Other specified disorders of bone density and structure, right ankle and foot: Secondary | ICD-10-CM | POA: Diagnosis not present

## 2022-03-09 DIAGNOSIS — M2141 Flat foot [pes planus] (acquired), right foot: Secondary | ICD-10-CM | POA: Diagnosis not present

## 2022-03-09 DIAGNOSIS — M7731 Calcaneal spur, right foot: Secondary | ICD-10-CM | POA: Diagnosis not present

## 2022-03-09 DIAGNOSIS — Z4789 Encounter for other orthopedic aftercare: Secondary | ICD-10-CM | POA: Diagnosis not present

## 2022-03-09 DIAGNOSIS — M21271 Flexion deformity, right ankle and toes: Secondary | ICD-10-CM | POA: Diagnosis not present

## 2022-03-16 DIAGNOSIS — M25571 Pain in right ankle and joints of right foot: Secondary | ICD-10-CM | POA: Diagnosis not present

## 2022-03-16 DIAGNOSIS — Z4789 Encounter for other orthopedic aftercare: Secondary | ICD-10-CM | POA: Diagnosis not present

## 2022-03-24 DIAGNOSIS — Z4789 Encounter for other orthopedic aftercare: Secondary | ICD-10-CM | POA: Diagnosis not present

## 2022-03-24 DIAGNOSIS — M25571 Pain in right ankle and joints of right foot: Secondary | ICD-10-CM | POA: Diagnosis not present

## 2022-03-29 DIAGNOSIS — M75122 Complete rotator cuff tear or rupture of left shoulder, not specified as traumatic: Secondary | ICD-10-CM | POA: Diagnosis not present

## 2022-03-29 DIAGNOSIS — M19012 Primary osteoarthritis, left shoulder: Secondary | ICD-10-CM | POA: Diagnosis not present

## 2022-04-06 DIAGNOSIS — Z79899 Other long term (current) drug therapy: Secondary | ICD-10-CM | POA: Diagnosis not present

## 2022-04-06 DIAGNOSIS — M160 Bilateral primary osteoarthritis of hip: Secondary | ICD-10-CM | POA: Diagnosis not present

## 2022-04-06 DIAGNOSIS — G8918 Other acute postprocedural pain: Secondary | ICD-10-CM | POA: Diagnosis not present

## 2022-04-06 DIAGNOSIS — M19012 Primary osteoarthritis, left shoulder: Secondary | ICD-10-CM | POA: Diagnosis not present

## 2022-04-06 DIAGNOSIS — Z471 Aftercare following joint replacement surgery: Secondary | ICD-10-CM | POA: Diagnosis not present

## 2022-04-06 DIAGNOSIS — Z6841 Body Mass Index (BMI) 40.0 and over, adult: Secondary | ICD-10-CM | POA: Diagnosis not present

## 2022-04-06 DIAGNOSIS — M19112 Post-traumatic osteoarthritis, left shoulder: Secondary | ICD-10-CM | POA: Diagnosis not present

## 2022-04-06 DIAGNOSIS — I4891 Unspecified atrial fibrillation: Secondary | ICD-10-CM | POA: Diagnosis not present

## 2022-04-06 DIAGNOSIS — E669 Obesity, unspecified: Secondary | ICD-10-CM | POA: Diagnosis not present

## 2022-04-06 DIAGNOSIS — M1712 Unilateral primary osteoarthritis, left knee: Secondary | ICD-10-CM | POA: Diagnosis not present

## 2022-04-06 DIAGNOSIS — Z96612 Presence of left artificial shoulder joint: Secondary | ICD-10-CM | POA: Diagnosis not present

## 2022-04-06 DIAGNOSIS — I10 Essential (primary) hypertension: Secondary | ICD-10-CM | POA: Diagnosis not present

## 2022-04-07 DIAGNOSIS — M1712 Unilateral primary osteoarthritis, left knee: Secondary | ICD-10-CM | POA: Diagnosis not present

## 2022-04-07 DIAGNOSIS — M19012 Primary osteoarthritis, left shoulder: Secondary | ICD-10-CM | POA: Diagnosis not present

## 2022-04-07 DIAGNOSIS — Z79899 Other long term (current) drug therapy: Secondary | ICD-10-CM | POA: Diagnosis not present

## 2022-04-07 DIAGNOSIS — I9789 Other postprocedural complications and disorders of the circulatory system, not elsewhere classified: Secondary | ICD-10-CM | POA: Diagnosis not present

## 2022-04-07 DIAGNOSIS — I10 Essential (primary) hypertension: Secondary | ICD-10-CM | POA: Diagnosis not present

## 2022-04-07 DIAGNOSIS — E669 Obesity, unspecified: Secondary | ICD-10-CM | POA: Diagnosis not present

## 2022-04-07 DIAGNOSIS — Z6841 Body Mass Index (BMI) 40.0 and over, adult: Secondary | ICD-10-CM | POA: Diagnosis not present

## 2022-04-07 DIAGNOSIS — M160 Bilateral primary osteoarthritis of hip: Secondary | ICD-10-CM | POA: Diagnosis not present

## 2022-04-07 DIAGNOSIS — I4891 Unspecified atrial fibrillation: Secondary | ICD-10-CM | POA: Diagnosis not present

## 2022-04-08 DIAGNOSIS — M19112 Post-traumatic osteoarthritis, left shoulder: Secondary | ICD-10-CM | POA: Diagnosis not present

## 2022-05-31 ENCOUNTER — Other Ambulatory Visit: Payer: Self-pay | Admitting: Internal Medicine

## 2022-05-31 DIAGNOSIS — M48061 Spinal stenosis, lumbar region without neurogenic claudication: Secondary | ICD-10-CM

## 2022-06-01 ENCOUNTER — Other Ambulatory Visit: Payer: Self-pay | Admitting: Internal Medicine

## 2022-06-01 ENCOUNTER — Encounter: Payer: Self-pay | Admitting: Internal Medicine

## 2022-06-01 ENCOUNTER — Ambulatory Visit (INDEPENDENT_AMBULATORY_CARE_PROVIDER_SITE_OTHER): Payer: BC Managed Care – PPO | Admitting: Internal Medicine

## 2022-06-01 VITALS — BP 140/72 | HR 66 | Temp 98.2°F | Ht 67.0 in | Wt 268.0 lb

## 2022-06-01 DIAGNOSIS — I10 Essential (primary) hypertension: Secondary | ICD-10-CM

## 2022-06-01 DIAGNOSIS — M216X1 Other acquired deformities of right foot: Secondary | ICD-10-CM

## 2022-06-01 DIAGNOSIS — I480 Paroxysmal atrial fibrillation: Secondary | ICD-10-CM | POA: Insufficient documentation

## 2022-06-01 DIAGNOSIS — M48061 Spinal stenosis, lumbar region without neurogenic claudication: Secondary | ICD-10-CM

## 2022-06-01 DIAGNOSIS — R5383 Other fatigue: Secondary | ICD-10-CM

## 2022-06-01 DIAGNOSIS — M1712 Unilateral primary osteoarthritis, left knee: Secondary | ICD-10-CM

## 2022-06-01 DIAGNOSIS — M130 Polyarthritis, unspecified: Secondary | ICD-10-CM | POA: Diagnosis not present

## 2022-06-01 DIAGNOSIS — E538 Deficiency of other specified B group vitamins: Secondary | ICD-10-CM

## 2022-06-01 DIAGNOSIS — Z6841 Body Mass Index (BMI) 40.0 and over, adult: Secondary | ICD-10-CM

## 2022-06-01 DIAGNOSIS — Z125 Encounter for screening for malignant neoplasm of prostate: Secondary | ICD-10-CM

## 2022-06-01 DIAGNOSIS — E559 Vitamin D deficiency, unspecified: Secondary | ICD-10-CM

## 2022-06-01 DIAGNOSIS — G4733 Obstructive sleep apnea (adult) (pediatric): Secondary | ICD-10-CM

## 2022-06-01 LAB — CBC WITH DIFFERENTIAL/PLATELET
Basophils Absolute: 0.1 10*3/uL (ref 0.0–0.1)
Basophils Relative: 1.3 % (ref 0.0–3.0)
Eosinophils Absolute: 0.2 10*3/uL (ref 0.0–0.7)
Eosinophils Relative: 3.6 % (ref 0.0–5.0)
HCT: 40.4 % (ref 39.0–52.0)
Hemoglobin: 13.7 g/dL (ref 13.0–17.0)
Lymphocytes Relative: 18.7 % (ref 12.0–46.0)
Lymphs Abs: 0.9 10*3/uL (ref 0.7–4.0)
MCHC: 34 g/dL (ref 30.0–36.0)
MCV: 92.1 fl (ref 78.0–100.0)
Monocytes Absolute: 0.5 10*3/uL (ref 0.1–1.0)
Monocytes Relative: 10 % (ref 3.0–12.0)
Neutro Abs: 3 10*3/uL (ref 1.4–7.7)
Neutrophils Relative %: 66.4 % (ref 43.0–77.0)
Platelets: 214 10*3/uL (ref 150.0–400.0)
RBC: 4.39 Mil/uL (ref 4.22–5.81)
RDW: 14.7 % (ref 11.5–15.5)
WBC: 4.6 10*3/uL (ref 4.0–10.5)

## 2022-06-01 LAB — COMPREHENSIVE METABOLIC PANEL
ALT: 10 U/L (ref 0–53)
AST: 14 U/L (ref 0–37)
Albumin: 4.2 g/dL (ref 3.5–5.2)
Alkaline Phosphatase: 79 U/L (ref 39–117)
BUN: 11 mg/dL (ref 6–23)
CO2: 26 mEq/L (ref 19–32)
Calcium: 9 mg/dL (ref 8.4–10.5)
Chloride: 104 mEq/L (ref 96–112)
Creatinine, Ser: 0.47 mg/dL (ref 0.40–1.50)
GFR: 109.94 mL/min (ref >60.00)
Glucose, Bld: 88 mg/dL (ref 70–99)
Potassium: 4 mEq/L (ref 3.5–5.1)
Sodium: 140 mEq/L (ref 135–145)
Total Bilirubin: 0.7 mg/dL (ref 0.2–1.2)
Total Protein: 6.4 g/dL (ref 6.0–8.3)

## 2022-06-01 LAB — B12 AND FOLATE PANEL
Folate: 14.6 ng/mL (ref >5.9)
Vitamin B-12: 316 pg/mL (ref 211–911)

## 2022-06-01 LAB — MICROALBUMIN / CREATININE URINE RATIO
Creatinine,U: 29.4 mg/dL
Microalb Creat Ratio: 2.7 mg/g (ref 0.0–30.0)
Microalb, Ur: 0.8 mg/dL (ref 0.0–1.9)

## 2022-06-01 LAB — C-REACTIVE PROTEIN: CRP: <1 mg/dL (ref 0.5–20.0)

## 2022-06-01 LAB — SEDIMENTATION RATE: Sed Rate: 10 mm/hr (ref 0–20)

## 2022-06-01 LAB — PSA: PSA: 0.86 ng/mL (ref 0.10–4.00)

## 2022-06-01 LAB — VITAMIN D 25 HYDROXY (VIT D DEFICIENCY, FRACTURES): VITD: 16.95 ng/mL — ABNORMAL LOW (ref 30.00–100.00)

## 2022-06-01 MED ORDER — GABAPENTIN 100 MG PO CAPS: 100.0000 mg | ORAL_CAPSULE | Freq: Three times a day (TID) | ORAL | 3 refills | Status: DC

## 2022-06-01 MED ORDER — PREDNISONE 10 MG PO TABS: ORAL_TABLET | ORAL | 0 refills | Status: DC

## 2022-06-01 MED ORDER — ERYTHROMYCIN 5 MG/GM OP OINT: 1.0000 | TOPICAL_OINTMENT | Freq: Three times a day (TID) | OPHTHALMIC | 0 refills | Status: DC

## 2022-06-01 MED ORDER — OMEPRAZOLE 40 MG PO CPDR: 40.0000 mg | DELAYED_RELEASE_CAPSULE | Freq: Every day | ORAL | 1 refills | Status: DC

## 2022-06-01 MED ORDER — TRAMADOL HCL 50 MG PO TABS: 50.0000 mg | ORAL_TABLET | Freq: Four times a day (QID) | ORAL | 0 refills | Status: AC | PRN

## 2022-06-01 NOTE — Assessment & Plan Note (Signed)
He has periodically been able to lose significant amounts of weight with Weight Watchers and other regimented programs

## 2022-06-01 NOTE — Progress Notes (Signed)
Subjective:  Patient ID: John Wolf, male    DOB: [birth year]  Age: 67 y.o. 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 Diagnoses of Hypertension, unspecified type, Other fatigue, Prostate cancer screening, Vitamin D deficiency, Acquired planovalgus deformity of right foot, B12 deficiency, Primary osteoarthritis of left knee, Morbid obesity with BMI of 40.0-44.9, adult ([place]), OSA (obstructive sleep apnea), Degenerative lumbar spinal stenosis, PAF (paroxysmal atrial fibrillation) ([place]), and Essential hypertension were also pertinent to this visit.   HPI John Wolf presents for follow up on  joint pain Chief Complaint  Patient presents with   Back Pain    John Wolf is a 67 yr old male with a history of hypertension ,  obesity ,  lumbar spine DJD  Charcot joint right ankle,  DJD bilateral knees and shoulders who presents after being lost to follow up since November 2022.  He has had several orthopedic interventions since his last visit with me.  His chief complaint today is right sided scitaica  and fatigue .  His chronic right sided sciatica has been quiescent for the last several months  during rehab from his surgeries, but 2 weeks ago the pain become persistent and he has been taking excessive amounts  of advil and tylenol  daily with little relief.    1) lumbar DDD by Jan 2023 MRI done at [facility]  No interventions have been done on the sine due to multiple other orthopedic procedures that needed addressing first :  right  ankle reconstruction,  left knee replacement , left shoulder   Jun 13 2021 left knee replacement December 16 2021:  right ankle reconstruction Apr 06 2022:  left shoulder reconstruction complicated by atrial fib   OSA was diagnosed between surgeries , before the atrial fib . Has not been wearing CPAP on a regular basis ; typically goes to sleep with it but after 2-3 hours cannot return to sleep after voiding  Atrial fib : referred to [facility]  Cardiology  last seen Jan 7.   ZIO monitor worn:  predominant rhythm was Sinus tach. "No anticogaluation given CHADS score but likely will be prescribed in the future when he turns 67 {P  HTN:  cardiology prescribed lisinopril but he has not been taking it .  He has not taken hctz in months either  since he had an episode of orthostatic hypotension in Nov 2022 attributed to significant intentional weight loss.   2) stye right eye 2 days ago. Lower eyelid .  No recent viral infection.  Has a cat and a dog   3) HTN:  has not been taking hctz or lisinopril .   Feels fatigued, not sleeping well due to pain.  Pain involves left shoulder,  both knees,  lower back .    no elbow or wrist pain .  New pain in the right knee but sciatica on the right side .  Has not taken opioids  in months  (last fefill of oxycdoe Dec 12 but has not used it, last tramadol rx June 2023 for #28.  ) but was taking  something prescribed by Orthopedics , possibly gabapentin?  (Not sure) \every 6 hours for 30 days following the shoulder  replacement.  Has not taken any since Jan 12. Chas been taking 2 advil and 2 tylenol 3 to 4 times daily for the last 2 weeks . Not taking a PPI  Outpatient Medications Prior to Visit  Medication Sig Dispense Refill   acetaminophen (TYLENOL)  325 MG tablet Take 325 mg by mouth every 6 (six) hours as needed.     ibuprofen (ADVIL) 200 MG tablet Take 200 mg by mouth every 6 (six) hours as needed.     lisinopril (ZESTRIL) 20 MG tablet Take 1 tablet by mouth daily.     diclofenac (VOLTAREN) 75 MG EC tablet TAKE 1 TABLET BY MOUTH 2 TIMES DAILY. (Patient not taking: Reported on 06/01/2022) 180 tablet 1   pantoprazole (PROTONIX) 20 MG tablet TAKE ONE TABLET EVERY DAY (Patient not taking: Reported on 06/01/2022) 90 tablet 1   cyanocobalamin (,VITAMIN B-12,) 1000 MCG/ML injection Inject 1 mL (1,000 mcg total) into the muscle once a week. For 3 weeks,  Then monthly (Patient not taking: Reported on 08/18/2021) 1 mL 11    predniSONE (DELTASONE) 10 MG tablet 6 tablets on Day 1 , then reduce by 1 tablet daily until gone (Patient not taking: Reported on 06/01/2022) 21 tablet 0   Syringe/Needle, Disp, (SYRINGE 3CC/25GX1") 25G X 1" 3 ML MISC Use for b12 injections (Patient not taking: Reported on 06/01/2022) 50 each 0   Vitamin D, Ergocalciferol, (DRISDOL) 1.25 MG (50000 UNIT) CAPS capsule Take 1 capsule (50,000 Units total) by mouth every 7 (seven) days. (Patient not taking: Reported on 08/18/2021) 12 capsule 3   No facility-administered medications prior to visit.    Review of Systems;  Patient denies headache, fevers, malaise, unintentional weight loss, skin rash, eye pain, sinus congestion and sinus pain, sore throat, dysphagia,  hemoptysis , cough, dyspnea, wheezing, chest pain, palpitations, orthopnea, edema, abdominal pain, nausea, melena, diarrhea, constipation, flank pain, dysuria, hematuria, urinary  Frequency, nocturia, numbness, tingling, seizures,  Focal weakness, Loss of consciousness,  Tremor, insomnia, depression, anxiety, and suicidal ideation.      Objective:  BP (!) 140/72   Pulse 66   Temp 98.2 F (36.8 C) (Oral)   Ht '5\' 7"'$  (1.702 m)   Wt 268 lb (121.6 kg)   SpO2 95%   BMI 41.97 kg/m   BP Readings from Last 3 Encounters:  06/01/22 (!) 140/72  08/27/21 133/69  04/10/21 124/62    Wt Readings from Last 3 Encounters:  06/01/22 268 lb (121.6 kg)  08/27/21 215 lb (97.5 kg)  04/10/21 234 lb (106.1 kg)    Physical Exam Vitals reviewed.  Constitutional:      General: He is not in acute distress.    Appearance: Normal appearance. He is normal weight. He is not ill-appearing, toxic-appearing or diaphoretic.  HENT:     Head: Normocephalic and atraumatic.     Right Ear: Tympanic membrane, ear canal and external ear normal. There is no impacted cerumen.     Left Ear: Tympanic membrane, ear canal and external ear normal. There is no impacted cerumen.     Nose: Nose normal.     Mouth/Throat:      Mouth: Mucous membranes are moist.     Pharynx: Oropharynx is clear.  Eyes:     General: No scleral icterus.       Right eye: No discharge.        Left eye: No discharge.     Conjunctiva/sclera: Conjunctivae normal.  Neck:     Thyroid: No thyromegaly.     Vascular: No carotid bruit or JVD.  Cardiovascular:     Rate and Rhythm: Normal rate and regular rhythm.     Heart sounds: Normal heart sounds.  Pulmonary:     Effort: Pulmonary effort is normal. No respiratory distress.  Breath sounds: Normal breath sounds.  Abdominal:     General: Bowel sounds are normal.     Palpations: Abdomen is soft. There is no mass.     Tenderness: There is no abdominal tenderness. There is no guarding or rebound.  Musculoskeletal:     Cervical back: Normal range of motion and neck supple.     Lumbar back: Spasms present. No tenderness. Decreased range of motion. Positive right straight leg raise test.     Right lower leg: Edema present.  Lymphadenopathy:     Cervical: No cervical adenopathy.  Skin:    General: Skin is warm and dry.  Neurological:     General: No focal deficit present.     Mental Status: He is alert and oriented to person, place, and time. Mental status is at baseline.  Psychiatric:        Mood and Affect: Mood normal.        Behavior: Behavior normal.        Thought Content: Thought content normal.        Judgment: Judgment normal.     Lab Results  Component Value Date   HGBA1C 4.7 03/10/2021   HGBA1C 5.3 05/25/2019   HGBA1C 5.2 06/16/2018    Lab Results  Component Value Date   CREATININE 0.82 03/10/2021   CREATININE 0.69 08/01/2020   CREATININE 0.73 05/25/2019    Lab Results  Component Value Date   WBC 6.3 03/10/2021   HGB 14.7 03/10/2021   HCT 43.6 03/10/2021   PLT 209.0 03/10/2021   GLUCOSE 85 03/10/2021   CHOL 159 03/10/2021   TRIG 64.0 03/10/2021   HDL 60.90 03/10/2021   LDLDIRECT 106.0 06/16/2018   LDLCALC 86 03/10/2021   ALT 13 03/10/2021    AST 17 03/10/2021   NA 138 03/10/2021   K 4.1 03/10/2021   CL 102 03/10/2021   CREATININE 0.82 03/10/2021   BUN 18 03/10/2021   CO2 29 03/10/2021   TSH 0.86 03/10/2021   PSA 0.87 08/01/2020   HGBA1C 4.7 03/10/2021   MICROALBUR 1.0 08/01/2020    No results found.  Assessment & Plan:  .Polyarthritis -     Sedimentation rate -     C-reactive protein  Hypertension, unspecified type -     Microalbumin / creatinine urine ratio -     Comprehensive metabolic panel -     Lipid Panel w/reflex Direct LDL  Other fatigue -     CBC with Differential/Platelet -     B12 and Folate Panel  Prostate cancer screening -     PSA  Vitamin D deficiency -     VITAMIN D 25 Hydroxy (Vit-D Deficiency, Fractures)  Acquired planovalgus deformity of right foot Assessment & Plan: S/p reconstruction  August 2023 . He is ambulating better and able to drive short distances.    B12 deficiency Assessment & Plan: Rechecking level today as he has not been taking supplements in months   Lab Results  Component Value Date   VITAMINB12 1,520 (H) 03/10/2021      Primary osteoarthritis of left knee Assessment & Plan: S/p left knee TKR  Feb 2023    Morbid obesity with BMI of 40.0-44.9, adult [facility]) Assessment & Plan: He has periodically been able to lose significant amounts of weight with Weight Watchers and other regimented programs   OSA (obstructive sleep apnea) Assessment & Plan: Untreated for years due to side sleeping.  Repeat study (home sleep) was done  this past year .  He has not tolerated use of CPAP for more than a few hours per night.  Will address once his pain is addressed.  Continue to  incline head of bed    Degenerative lumbar spinal stenosis Assessment & Plan: A repeat MRI is needed given patient's recurrence of sciatica in the setting of recent right ankle and left knee surgeries.  I have ordered the MRI to be done at a [facility] facility to facilitate viewing by his neurosurgeon  (appt in 2 weeks).  Tramadol  prescribed.  Will dc advil and start prednisone taper.     PAF (paroxysmal atrial fibrillation) ([facility]) Assessment & Plan: Occurring perioperatively.  ZIO monitor showed predominantly SVT and ST.     Essential hypertension Assessment & Plan: Screening labs for lytes, and proteinuria today.  He has been asked to check her pressures at home and submit readings for evaluation. Renal function will be checked today    Other orders -     Omeprazole; Take 1 capsule (40 mg total) by mouth daily. On an empty stomach  Dispense: 90 capsule; Refill: 1 -     traMADol HCl; Take 1 tablet (50 mg total) by mouth every 6 (six) hours as needed for up to 7 days.  Dispense: 28 tablet; Refill: 0 -     Erythromycin; Place 1 Application into the right eye 3 (three) times daily.  Dispense: 3.5 g; Refill: 0 -     predniSONE; 6 tablets daily for 3 days, then reduce by 1 tablet daily until gone  Dispense: 33 tablet; Refill: 0     I provided a total of 60  minutes of face-to-face time during this encounter reviewing patient's last visit with me, patient's  most recent visit with cardiology,  orthopedics and neurosurgery,  recent surgical and non surgical procedures, previous  labs and imaging studies, counseling on currently addressed issues,  and post visit ordering to diagnostics and therapeutics .   Follow-up: No follow-ups on file.   Crecencio Mc, MD

## 2022-06-01 NOTE — Assessment & Plan Note (Signed)
A repeat MRI is needed given patient's recurrence of sciatica in the setting of recent right ankle and left knee surgeries.  I have ordered the MRI to be done at a [facility] facility to facilitate viewing by his neurosurgeon (appt in 2 weeks).  Tramadol  prescribed.  Will dc advil and start prednisone taper.

## 2022-06-01 NOTE — Assessment & Plan Note (Signed)
Screening labs for lytes, and proteinuria today.  He has been asked to check her pressures at home and submit readings for evaluation. Renal function will be checked today

## 2022-06-01 NOTE — Assessment & Plan Note (Signed)
Rechecking level today as he has not been taking supplements in months   Lab Results  Component Value Date   VITAMINB12 1,520 (H) 03/10/2021

## 2022-06-01 NOTE — Assessment & Plan Note (Signed)
Untreated for years due to side sleeping.  Repeat study (home sleep) was done  this past year .  He has not tolerated use of CPAP for more than a few hours per night.  Will address once his pain is addressed.  Continue to  incline head of bed

## 2022-06-01 NOTE — Assessment & Plan Note (Signed)
Occurring perioperatively.  ZIO monitor showed predominantly SVT and ST.

## 2022-06-01 NOTE — Patient Instructions (Addendum)
Start the omeprazole TODAY,  AND STARTING TOMORROW TAKE IT IN THE MORNING ON AN EMPTY STOMACH   TO PROTECT AGAINST STOMACH ACID    Reduce advil to 400 mg every 6 hours Continue 1000 mg tylenol every 12 hours   Add tramadol every 6 hours.  We can increase dose to 100 mg (2 tablets) every 6 hours if needed  MRI to be done asap  Erythromycn ointment for stye    Labs today   Check BP at home IN A CALM STATE.  NOT AGITATED !

## 2022-06-01 NOTE — Assessment & Plan Note (Signed)
S/p reconstruction  August 2023 . He is ambulating better and able to drive short distances.

## 2022-06-01 NOTE — Assessment & Plan Note (Signed)
S/p left knee TKR  Feb 2023

## 2022-06-02 LAB — LIPID PANEL W/REFLEX DIRECT LDL
Cholesterol: 166 mg/dL (ref <200)
HDL: 70 mg/dL (ref >=40)
LDL Cholesterol (Calc): 78 mg/dL (calc)
Non-HDL Cholesterol (Calc): 96 mg/dL (calc) (ref <130)
Total CHOL/HDL Ratio: 2.4 (calc) (ref <5.0)
Triglycerides: 92 mg/dL (ref <150)

## 2022-06-02 MED ORDER — ERGOCALCIFEROL 1.25 MG (50000 UT) PO CAPS: 50000.0000 [IU] | ORAL_CAPSULE | ORAL | 0 refills | Status: DC

## 2022-06-02 NOTE — Addendum Note (Signed)
Addended by: Crecencio Mc on: 06/02/2022 11:51 PM   Modules accepted: Orders

## 2022-06-08 ENCOUNTER — Telehealth: Payer: Self-pay

## 2022-06-08 NOTE — Telephone Encounter (Signed)
Spoke with pt and informed him of his lab results. Pt gave a verbal understanding.

## 2022-06-11 ENCOUNTER — Telehealth: Payer: Self-pay | Admitting: Internal Medicine

## 2022-06-11 ENCOUNTER — Other Ambulatory Visit: Payer: Self-pay | Admitting: Internal Medicine

## 2022-06-11 MED ORDER — HYDROCODONE-ACETAMINOPHEN 10-325 MG PO TABS: 1.0000 | ORAL_TABLET | Freq: Four times a day (QID) | ORAL | 0 refills | Status: DC | PRN

## 2022-06-11 NOTE — Telephone Encounter (Signed)
Spoke with Mariann Laster and she stated that she is able to get pt in for the MRI at [facility] earlier than the appt he is scheduled for with [facility]. I have printed off the order and placed in quick sign folder.

## 2022-06-11 NOTE — Telephone Encounter (Signed)
Mariann Laster from [facility] called about the MRI order, she has not received order. Fax # [phone] phone [phone].

## 2022-06-11 NOTE — Telephone Encounter (Signed)
Re-faxed MRI order to [phone].

## 2022-06-11 NOTE — Telephone Encounter (Signed)
John Wolf from [facility] would like to be called in regards to an MRI order

## 2022-06-11 NOTE — Telephone Encounter (Signed)
Emailed MRI orders to [email or web address]

## 2022-06-17 ENCOUNTER — Other Ambulatory Visit: Payer: Self-pay | Admitting: Internal Medicine

## 2022-06-17 DIAGNOSIS — M48061 Spinal stenosis, lumbar region without neurogenic claudication: Secondary | ICD-10-CM

## 2022-06-17 MED ORDER — HYDROCODONE-ACETAMINOPHEN 10-325 MG PO TABS: 1.0000 | ORAL_TABLET | Freq: Three times a day (TID) | ORAL | 0 refills | Status: DC | PRN

## 2022-06-17 NOTE — Assessment & Plan Note (Signed)
MRI Feb 2024 at [facility] :

## 2022-06-18 ENCOUNTER — Other Ambulatory Visit: Payer: Self-pay | Admitting: Internal Medicine

## 2022-06-18 MED ORDER — HYDROCODONE-ACETAMINOPHEN 10-325 MG PO TABS: 1.0000 | ORAL_TABLET | Freq: Three times a day (TID) | ORAL | 0 refills | Status: DC | PRN

## 2022-06-18 NOTE — Addendum Note (Signed)
Addended by: Crecencio Mc on: 06/18/2022 03:09 PM   Modules accepted: Orders

## 2022-06-20 ENCOUNTER — Other Ambulatory Visit: Payer: BC Managed Care – PPO

## 2022-06-22 ENCOUNTER — Encounter: Payer: Self-pay | Admitting: Internal Medicine

## 2022-06-24 ENCOUNTER — Other Ambulatory Visit: Payer: Self-pay | Admitting: Internal Medicine

## 2022-06-24 DIAGNOSIS — M25551 Pain in right hip: Secondary | ICD-10-CM

## 2022-06-24 MED ORDER — AMOXICILLIN 500 MG PO CAPS: 1000.0000 mg | ORAL_CAPSULE | Freq: Once | ORAL | 2 refills | Status: AC

## 2022-06-25 ENCOUNTER — Other Ambulatory Visit: Payer: BC Managed Care – PPO

## 2022-06-25 ENCOUNTER — Ambulatory Visit (INDEPENDENT_AMBULATORY_CARE_PROVIDER_SITE_OTHER): Payer: BC Managed Care – PPO

## 2022-06-25 DIAGNOSIS — M25551 Pain in right hip: Secondary | ICD-10-CM | POA: Diagnosis not present

## 2022-07-22 ENCOUNTER — Other Ambulatory Visit: Payer: Self-pay | Admitting: Internal Medicine

## 2022-07-22 MED ORDER — HYDROCODONE-ACETAMINOPHEN 10-325 MG PO TABS: 1.0000 | ORAL_TABLET | Freq: Three times a day (TID) | ORAL | 0 refills | Status: AC | PRN

## 2022-08-02 ENCOUNTER — Other Ambulatory Visit: Payer: Self-pay | Admitting: Internal Medicine

## 2022-08-02 DIAGNOSIS — N390 Urinary tract infection, site not specified: Secondary | ICD-10-CM | POA: Insufficient documentation

## 2022-08-02 DIAGNOSIS — N3000 Acute cystitis without hematuria: Secondary | ICD-10-CM

## 2022-08-02 MED ORDER — CIPROFLOXACIN HCL 500 MG PO TABS: 250.0000 mg | ORAL_TABLET | Freq: Two times a day (BID) | ORAL | 0 refills | Status: DC

## 2022-08-02 MED ORDER — CIPROFLOXACIN HCL 500 MG PO TABS: 500.0000 mg | ORAL_TABLET | Freq: Two times a day (BID) | ORAL | 0 refills | Status: DC

## 2022-08-02 NOTE — Assessment & Plan Note (Signed)
Empiric treatment iwht cipro 500 mg bid starting April 7  for ten days ; patient was given 5 days of  samples  by Dr Darrick Huntsman and rx for 5 [facility]

## 2022-09-09 DIAGNOSIS — I351 Nonrheumatic aortic (valve) insufficiency: Secondary | ICD-10-CM

## 2022-09-11 DIAGNOSIS — I351 Nonrheumatic aortic (valve) insufficiency: Secondary | ICD-10-CM | POA: Insufficient documentation

## 2022-09-11 NOTE — Assessment & Plan Note (Signed)
by ECHO done by [facility] cardiology  May 2024

## 2023-01-07 ENCOUNTER — Telehealth: Payer: Self-pay

## 2023-01-07 ENCOUNTER — Ambulatory Visit: Payer: BC Managed Care – PPO | Admitting: Nurse Practitioner

## 2023-01-07 NOTE — Telephone Encounter (Signed)
Patient's girlfriend, Hyman Bower, called to state patient has an appointment with [place] today but they have a device that indicates he is experiencing afib.  Randa Lynn states she is not sure if they should go ahead and go to the hospital or keep their appointment with [place].  I let her know they should go to the hospital now.

## 2023-02-10 ENCOUNTER — Other Ambulatory Visit: Payer: Self-pay | Admitting: Internal Medicine

## 2023-02-10 ENCOUNTER — Telehealth: Payer: Self-pay | Admitting: Internal Medicine

## 2023-02-10 NOTE — Telephone Encounter (Signed)
Spoke with Angie from [facility] and she stated that she went out to see pt since has been out of the hospital. She stated that pt had pretty extensive heart surgery. She stated that the wife is concerned because pt is not eating well at all, his blood pressure is running low, reading the RN got was 100/60 no symptoms. She stated that pt is still taking his bp medications plus he is on both Aspirin 81 mg and Eliquis BID. She is also concerned about him being so weak and short of breath. He is supposed to be using a spirometer but is having trouble using it because it hurts his chest. Angie also stated that pt isn't very motivated and isn't sure if he has developed some depression or whether that is his base line.

## 2023-02-10 NOTE — Telephone Encounter (Signed)
Angie from [facility] called and would like a call back to get Medication clarification, Vitals signs and intake. \ Please call [phone]

## 2023-02-11 ENCOUNTER — Encounter: Payer: Self-pay | Admitting: Internal Medicine

## 2023-02-11 ENCOUNTER — Telehealth (INDEPENDENT_AMBULATORY_CARE_PROVIDER_SITE_OTHER): Payer: Medicare Other | Admitting: Internal Medicine

## 2023-02-11 VITALS — Ht 67.0 in | Wt 263.5 lb

## 2023-02-11 DIAGNOSIS — I480 Paroxysmal atrial fibrillation: Secondary | ICD-10-CM

## 2023-02-11 DIAGNOSIS — G4701 Insomnia due to medical condition: Secondary | ICD-10-CM

## 2023-02-11 DIAGNOSIS — I9581 Postprocedural hypotension: Secondary | ICD-10-CM | POA: Diagnosis not present

## 2023-02-11 DIAGNOSIS — I420 Dilated cardiomyopathy: Secondary | ICD-10-CM

## 2023-02-11 DIAGNOSIS — I351 Nonrheumatic aortic (valve) insufficiency: Secondary | ICD-10-CM

## 2023-02-11 DIAGNOSIS — D649 Anemia, unspecified: Secondary | ICD-10-CM

## 2023-02-11 MED ORDER — ALPRAZOLAM 0.25 MG PO TABS: 0.2500 mg | ORAL_TABLET | Freq: Every evening | ORAL | 0 refills | Status: DC | PRN

## 2023-02-11 MED ORDER — MIRTAZAPINE 15 MG PO TABS: ORAL_TABLET | ORAL | 2 refills | Status: DC

## 2023-02-11 NOTE — Assessment & Plan Note (Addendum)
Recurrent despte TEE/cardioversion ,  MAZE procedure and left atrial appendage ligation.  Amiodarone dose was doubled today to 400 mg daily by [facility] for rate of 120's . Stressed the importance of treating his OSA to avoid nocturnal hypoxia

## 2023-02-11 NOTE — Telephone Encounter (Signed)
Angie called back to give the report from today's visit.   Orthostatic vitals:  Laying: 93/63  HR 124  Sitting:  91/79  HR 122  Standing:  94/80  HR 122  After walking for 6 minutes his bp was 110/80 with HR 122.  Angie stated that the the heart doctor increased his Amiodarone from 200 mg daily to 400 mg.   Current medications:   Tylenol 500 mg PRN Amiodarone 200 mg 2 tablets daily Eliquis 5 mg BID  Aspirin 81 mg once daily Lipitor 80 mg once daily Lasix 40 mg once daily Spironolactone 25 mg once daily Metoprolol Tartrate 25 mg BID  On Hold:  Metformin 500 mg daily Tamsulosin 0.4 mg daily put on hold today by cardiology  Discontinued at discharge:   Gabapentin  Ibuprofen Lisinopril Omeprazole Protonix  During visit Angie also stated that pt has 1+ pitting edema in the right leg and trace edema in the left leg. He had 1 lb weight gain over night even though he is not eating much at all.

## 2023-02-11 NOTE — Telephone Encounter (Signed)
Orders were given to Angie. She stated that she will try her best to get out there for a visit today.

## 2023-02-11 NOTE — Assessment & Plan Note (Signed)
Flomax suspended.  All prehospitalization BP meds have stopped.  Taking metoprolol 25 mg bid

## 2023-02-11 NOTE — Assessment & Plan Note (Addendum)
Likely iron deficiency givne his multiple surgical procedures over the last several months.  Hg v was 10.4 on Octo 15,  Needs CBC on Monday .  Passage of a black stool today at home worrisome for GI losses  given use of Eliquis

## 2023-02-11 NOTE — Assessment & Plan Note (Signed)
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

## 2023-02-11 NOTE — Assessment & Plan Note (Signed)
Adding ow dose remeron starting at 7.5 mg daily for concurrent dysgeusia.  Ok to add 0.25 mg alprazolam after one hour if not sleepy

## 2023-02-11 NOTE — Assessment & Plan Note (Signed)
EF 35% by last 2 ECHOs, most recentl Oct 15.  With global hypokinesis .  Continue spironolactone and furosemide.

## 2023-02-11 NOTE — Progress Notes (Signed)
Virtual Visit via Caregility   Note   This format is felt to be most appropriate for this patient at this time.  All issues noted in this document were discussed and addressed.  No physical exam was performed (except for noted visual exam findings with Video Visits).   I connected with Olyver  on 02/11/23 at  5:00 PM EDT by a video enabled telemedicine application or telephone and verified that I am speaking with the correct person using two identifiers. Location patient: home Location provider: work or home office Persons participating in the virtual visit: patient, provider  I discussed the limitations, risks, security and privacy concerns of performing an evaluation and management service by telephone and the availability of in person appointments. I also discussed with the patient that there may be a patient responsible charge related to this service. The patient expressed understanding and agreed to proceed.  Reason for visit: hospital follow up   HPI:  67 yr old male with aortic regurgitation and dilated cardiomyopathy, OSA , morbid obesity and atrial fib with RVR admitted to [facility] on Oct 8  for aortic valve replacement.   with RVE,  congestive heart failure, acute kidney injury.    s/p aortic valve replacement, graft repair aof ascending aortic aneurysm,  maze procedure The patient left the office before the visit was finished. Atrial appendage ligation and TEE/cardioversion (oct 15)   He denies chest pain  and has not used oxycodone  in 3-4 days.  Using tylenol.  Denies shortness of breath but has had atrial fib with RVR rate in 120's  for most of today.  Amiodarone dose was increased by cardiology today  he reports dysgeusia,  insomnia and generalized weakness,.  Despite poor appetite and daily use of lasix 40 mg and spironolactone 25 mg  ,  he  has gained 1 lb overnight  based on daily home  readings .  Had a black runny stool today .  Taking eliquis.  Denies nausea  Not using CPAP due  to frustration and difficutly with set up.     ROS: See pertinent positives and negatives per HPI.  Past Medical History:  Diagnosis Date   Acquired pes planus    Arthritis    Benign neoplasm of colon    History of left knee replacement    Hyperlipidemia    Hypertension    Obesity (BMI 30-39.9)    with 70 lb weight loss, ongoing   Onychia and paronychia of toe    Sleep apnea, obstructive 04/26/2000   wears CPAP    Past Surgical History:  Procedure Laterality Date   athroplasty left shoulder     COLONOSCOPY WITH PROPOFOL N/A 08/27/2021   Procedure: COLONOSCOPY WITH PROPOFOL;  Surgeon: Midge Minium, MD;  Location: [facility];  Service: Endoscopy;  Laterality: N/A;   left shoulder     shoulder dislocation, rotator    POLYPECTOMY N/A 08/27/2021   Procedure: POLYPECTOMY;  Surgeon: Midge Minium, MD;  Location: [facility];  Service: Endoscopy;  Laterality: N/A;    Family History  Problem Relation Age of Onset   Heart disease Mother    Stroke Father    Stroke Brother    Hypertension Brother    Cancer Brother 12       AML    SOCIAL HX:  reports that he has never smoked. He has never used smokeless tobacco. He reports current alcohol use. He reports that he does not use drugs.    Current  Outpatient Medications:    ALPRAZolam (XANAX) 0.25 MG tablet, Take 1 tablet (0.25 mg total) by mouth at bedtime as needed for anxiety. Or insomnia, Disp: 30 tablet, Rfl: 0   amiodarone (PACERONE) 200 MG tablet, Take 200 mg by mouth daily. Taking 2 tablets daily, Disp: , Rfl:    apixaban (ELIQUIS) 5 MG TABS tablet, Take 5 mg by mouth 2 (two) times daily., Disp: , Rfl:    aspirin 81 MG chewable tablet, Chew 81 mg by mouth daily., Disp: , Rfl:    atorvastatin (LIPITOR) 80 MG tablet, Take 80 mg by mouth daily., Disp: , Rfl:    furosemide (LASIX) 40 MG tablet, Take 40 mg by mouth daily., Disp: , Rfl:    metoprolol tartrate (LOPRESSOR) 25 MG tablet, Take 25 mg by mouth 2 (two)  times daily., Disp: , Rfl:    mirtazapine (REMERON) 15 MG tablet, 1/2 to 1 tablet 30 minutes before bedtime daily, Disp: 30 tablet, Rfl: 2   oxyCODONE (OXY IR/ROXICODONE) 5 MG immediate release tablet, Take by mouth., Disp: , Rfl:    spironolactone (ALDACTONE) 25 MG tablet, Take 1 tablet by mouth daily., Disp: , Rfl:    metFORMIN (GLUCOPHAGE-XR) 500 MG 24 hr tablet, Take 500 mg by mouth daily with breakfast. (Patient not taking: Reported on 02/11/2023), Disp: , Rfl:    tamsulosin (FLOMAX) 0.4 MG CAPS capsule, Take 0.4 mg by mouth daily. (Patient not taking: Reported on 02/11/2023), Disp: , Rfl:   EXAM:  VITALS per patient if applicable:  GENERAL: alert, oriented, appears weak but  in no acute distress  HEENT: atraumatic, conjunttiva clear, no obvious abnormalities on inspection of external nose and ears  NECK: normal movements of the head and neck  LUNGS: on inspection no signs of respiratory distress, breathing rate appears normal, no obvious gross SOB, gasping or wheezing  CV: no obvious cyanosis  MS: moves all visible extremities without noticeable abnormality  PSYCH/NEURO: pleasant and cooperative, appears weary,  denies depression , speech and thought processing grossly intact  ASSESSMENT AND PLAN: PAF (paroxysmal atrial fibrillation) (HCC) Assessment & Plan: Recurrent despte TEE/cardioversion ,  MAZE procedure and left atrial appendage ligation.  Amiodarone dose was doubled today to 400 mg daily by [facility] for rate of 120's . Stressed the importance of treating his OSA to avoid nocturnal hypoxia   Insomnia due to medical condition Assessment & Plan: Adding ow dose remeron starting at 7.5 mg daily for concurrent dysgeusia.  Ok to add 0.25 mg alprazolam after one hour if not sleepy   Hypotension after procedure Assessment & Plan: Flomax suspended.  All prehospitalization BP meds have stopped.  Taking metoprolol 25 mg bid    Dilated cardiomyopathy (HCC) Assessment  & Plan: EF 35% by last 2 ECHOs, most recentl Oct 15.  With global hypokinesis .  Continue spironolactone and furosemide.    Anemia, unspecified type Assessment & Plan: Likely iron deficiency givne his multiple surgical procedures over the last several months.  Hg v was 10.4 on Octo 15,  Needs CBC on Monday .  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 Or insomnia  Dispense: 30 tablet; Refill: 0      I discussed the assessment and treatment plan with the patient. The patient was provided an opportunity to ask questions and all were answered. The patient agreed with the plan and demonstrated an understanding of the instructions.   The patient was advised to call back or seek an in-person evaluation if the symptoms worsen or if the condition fails to improve as anticipated.   I spent 30 minutes dedicated to the care of this patient on the date of this encounter to include pre-visit review of his medical history,  Face-to-face time with the patient , and post visit ordering of testing and therapeutics.    Sherlene Shams, MD

## 2023-02-12 ENCOUNTER — Emergency Department: Payer: Medicare Other

## 2023-02-12 ENCOUNTER — Emergency Department
Admit: 2023-02-12 | Discharge: 2023-02-12 | Disposition: A | Discharge status: Home / Self Care | Payer: Medicare Other | Attending: Internal Medicine | Admitting: Internal Medicine

## 2023-02-12 ENCOUNTER — Emergency Department
Admission: EM | Admit: 2023-02-12 | Discharge: 2023-02-12 | Disposition: A | Discharge status: Other Acute Inpatient Hospital | Payer: Medicare Other | Attending: Emergency Medicine | Admitting: Emergency Medicine

## 2023-02-12 DIAGNOSIS — Z7982 Long term (current) use of aspirin: Secondary | ICD-10-CM | POA: Diagnosis not present

## 2023-02-12 DIAGNOSIS — Z79899 Other long term (current) drug therapy: Secondary | ICD-10-CM | POA: Diagnosis not present

## 2023-02-12 DIAGNOSIS — I3139 Other pericardial effusion (noninflammatory): Secondary | ICD-10-CM | POA: Diagnosis not present

## 2023-02-12 DIAGNOSIS — I110 Hypertensive heart disease with heart failure: Secondary | ICD-10-CM | POA: Insufficient documentation

## 2023-02-12 DIAGNOSIS — D649 Anemia, unspecified: Secondary | ICD-10-CM | POA: Diagnosis not present

## 2023-02-12 DIAGNOSIS — Z953 Presence of xenogenic heart valve: Secondary | ICD-10-CM | POA: Insufficient documentation

## 2023-02-12 DIAGNOSIS — Z7901 Long term (current) use of anticoagulants: Secondary | ICD-10-CM | POA: Insufficient documentation

## 2023-02-12 DIAGNOSIS — R7309 Other abnormal glucose: Secondary | ICD-10-CM | POA: Insufficient documentation

## 2023-02-12 DIAGNOSIS — Z8249 Family history of ischemic heart disease and other diseases of the circulatory system: Secondary | ICD-10-CM | POA: Insufficient documentation

## 2023-02-12 DIAGNOSIS — I314 Cardiac tamponade: Secondary | ICD-10-CM

## 2023-02-12 DIAGNOSIS — K625 Hemorrhage of anus and rectum: Secondary | ICD-10-CM | POA: Diagnosis present

## 2023-02-12 DIAGNOSIS — K922 Gastrointestinal hemorrhage, unspecified: Secondary | ICD-10-CM

## 2023-02-12 DIAGNOSIS — K920 Hematemesis: Secondary | ICD-10-CM | POA: Diagnosis not present

## 2023-02-12 DIAGNOSIS — I4891 Unspecified atrial fibrillation: Secondary | ICD-10-CM | POA: Insufficient documentation

## 2023-02-12 DIAGNOSIS — I340 Nonrheumatic mitral (valve) insufficiency: Secondary | ICD-10-CM | POA: Insufficient documentation

## 2023-02-12 LAB — COMPREHENSIVE METABOLIC PANEL
ALT: 32 U/L (ref 0–44)
AST: 27 U/L (ref 15–41)
Albumin: 2.5 g/dL — ABNORMAL LOW (ref 3.5–5.0)
Alkaline Phosphatase: 47 U/L (ref 38–126)
Anion gap: 7 (ref 5–15)
BUN: 43 mg/dL — ABNORMAL HIGH (ref 8–23)
CO2: 23 mmol/L (ref 22–32)
Calcium: 8 mg/dL — ABNORMAL LOW (ref 8.9–10.3)
Chloride: 106 mmol/L (ref 98–111)
Creatinine, Ser: 0.88 mg/dL (ref 0.61–1.24)
GFR, Estimated: >60 mL/min (ref >60)
Glucose, Bld: 124 mg/dL — ABNORMAL HIGH (ref 70–99)
Potassium: 5.2 mmol/L — ABNORMAL HIGH (ref 3.5–5.1)
Sodium: 136 mmol/L (ref 135–145)
Total Bilirubin: 0.2 mg/dL — ABNORMAL LOW (ref 0.3–1.2)
Total Protein: 5.2 g/dL — ABNORMAL LOW (ref 6.5–8.1)

## 2023-02-12 LAB — HEMOGLOBIN AND HEMATOCRIT, BLOOD
HCT: 29.6 % — ABNORMAL LOW (ref 39.0–52.0)
Hemoglobin: 9.7 g/dL — ABNORMAL LOW (ref 13.0–17.0)

## 2023-02-12 LAB — CBC WITH DIFFERENTIAL/PLATELET
Abs Immature Granulocytes: 0.32 10*3/uL — ABNORMAL HIGH (ref 0.00–0.07)
Basophils Absolute: 0.1 10*3/uL (ref 0.0–0.1)
Basophils Relative: 1 %
Eosinophils Absolute: 0.3 10*3/uL (ref 0.0–0.5)
Eosinophils Relative: 2 %
HCT: 23 % — ABNORMAL LOW (ref 39.0–52.0)
Hemoglobin: 7.6 g/dL — ABNORMAL LOW (ref 13.0–17.0)
Immature Granulocytes: 2 %
Lymphocytes Relative: 11 %
Lymphs Abs: 1.4 10*3/uL (ref 0.7–4.0)
MCH: 30.8 pg (ref 26.0–34.0)
MCHC: 33 g/dL (ref 30.0–36.0)
MCV: 93.1 fL (ref 80.0–100.0)
Monocytes Absolute: 0.9 10*3/uL (ref 0.1–1.0)
Monocytes Relative: 7 %
Neutro Abs: 10.3 10*3/uL — ABNORMAL HIGH (ref 1.7–7.7)
Neutrophils Relative %: 77 %
Platelets: 310 10*3/uL (ref 150–400)
RBC: 2.47 MIL/uL — ABNORMAL LOW (ref 4.22–5.81)
RDW: 13.2 % (ref 11.5–15.5)
WBC: 13.4 10*3/uL — ABNORMAL HIGH (ref 4.0–10.5)
nRBC: 0 % (ref 0.0–0.2)

## 2023-02-12 LAB — ECHOCARDIOGRAM COMPLETE
AR max vel: 1.31 cm2
AV Area VTI: 1.24 cm2
AV Area mean vel: 1.49 cm2
AV Mean grad: 4 mm[Hg]
AV Peak grad: 8.8 mm[Hg]
Ao pk vel: 1.48 m/s
Area-P 1/2: 6.07 cm2
Height: 67 in
S' Lateral: 4.2 cm
Single Plane A4C EF: 30.6 %
Weight: 4216.01 [oz_av]

## 2023-02-12 LAB — PREPARE RBC (CROSSMATCH)

## 2023-02-12 LAB — CBG MONITORING, ED: Glucose-Capillary: 103 mg/dL — ABNORMAL HIGH (ref 70–99)

## 2023-02-12 LAB — TROPONIN I (HIGH SENSITIVITY)
Troponin I (High Sensitivity): 252 ng/L (ref <18)
Troponin I (High Sensitivity): 255 ng/L (ref <18)

## 2023-02-12 LAB — ABO/RH: ABO/RH(D): A POS

## 2023-02-12 MED ORDER — SODIUM CHLORIDE 0.9% IV SOLUTION
Freq: Once | INTRAVENOUS | Status: DC
  Filled 2023-02-12: qty 250

## 2023-02-12 MED ORDER — PANTOPRAZOLE SODIUM 40 MG IV SOLR: 40.0000 mg | Freq: Two times a day (BID) | INTRAVENOUS | Status: DC

## 2023-02-12 MED ORDER — VANCOMYCIN HCL 2000 MG/400ML IV SOLN
2000.0000 mg | Freq: Once | INTRAVENOUS | Status: DC
  Filled 2023-02-12: qty 400

## 2023-02-12 MED ORDER — SODIUM CHLORIDE 0.9 % IV BOLUS
1000.0000 mL | Freq: Once | INTRAVENOUS | Status: AC
  Administered 2023-02-12: 1000 mL via INTRAVENOUS

## 2023-02-12 MED ORDER — IOHEXOL 350 MG/ML SOLN
100.0000 mL | Freq: Once | INTRAVENOUS | Status: AC | PRN
  Administered 2023-02-12: 100 mL via INTRAVENOUS

## 2023-02-12 MED ORDER — PIPERACILLIN-TAZOBACTAM 3.375 G IVPB
3.3750 g | Freq: Three times a day (TID) | INTRAVENOUS | Status: DC
  Filled 2023-02-12: qty 50

## 2023-02-12 MED ORDER — VANCOMYCIN HCL 500 MG/100ML IV SOLN
500.0000 mg | Freq: Once | INTRAVENOUS | Status: DC
  Filled 2023-02-12: qty 100

## 2023-02-12 MED ORDER — PANTOPRAZOLE SODIUM 40 MG IV SOLR
40.0000 mg | Freq: Four times a day (QID) | INTRAVENOUS | Status: DC
  Administered 2023-02-12: 40 mg via INTRAVENOUS
  Filled 2023-02-12: qty 10

## 2023-02-12 MED ORDER — PANTOPRAZOLE SODIUM 40 MG IV SOLR: 40.0000 mg | INTRAVENOUS | Status: AC

## 2023-02-12 MED ORDER — PIPERACILLIN-TAZOBACTAM 3.375 G IVPB 30 MIN: 3.3750 g | Freq: Three times a day (TID) | INTRAVENOUS | Status: DC

## 2023-02-12 MED ORDER — IOHEXOL 350 MG/ML SOLN
75.0000 mL | Freq: Once | INTRAVENOUS | Status: AC | PRN
  Administered 2023-02-12: 75 mL via INTRAVENOUS

## 2023-02-12 MED ORDER — VANCOMYCIN HCL IN DEXTROSE 1-5 GM/200ML-% IV SOLN: 1000.0000 mg | Freq: Two times a day (BID) | INTRAVENOUS | Status: DC

## 2023-02-12 NOTE — Consult Note (Signed)
Pharmacy Antibiotic Note  John Wolf is a 67 y.o. male admitted on 02/12/2023 with rectal bleeding and a wound infection . PMH includes afib on Eliquis, OSA, CHF, HTN, OA, aoritc valve replacement 02/02/2023, TEE with cardioversion on 02/08/2023. WBC elevated at 13.4, Hgb 7.6, plts 310. Pharmacy has been consulted for vancomycin and Zosyn dosing.  Plan: Vancomycin loading dose 2500 mg x1 followed by Vancomycin 1000 mg IV every 12 hours. Goal AUC of 400-550. Calculated AUC 482 and Cmin 13.8 Zosyn 3.375g IV q8h  Pharmacy will continue to monitor and dose adjust as needed    Height: 5\' 7"  (170.2 cm) Weight: 119.5 kg (263 lb 8 oz) IBW/kg (Calculated) : 66.1  Temp (24hrs), Avg:98 F (36.7 C), Min:98 F (36.7 C), Max:98 F (36.7 C)  Recent Labs  Lab 02/12/23 0941  WBC 13.4*  CREATININE 0.88    Estimated Creatinine Clearance: 103.6 mL/min (by C-G formula based on SCr of 0.88 mg/dL).    Allergies  Allergen Reactions   Quinolones Other (See Comments)    Fluoroquinolone antibiotics should be avoided in patients with history of aortic aneurysm if alternative therapy is available  ([email or web address])    Antimicrobials this admission: 10/19 vancomycin  >>  10/19 Zosyn >>   Dose adjustments this admission:   Microbiology results:   Thank you for allowing pharmacy to be a part of this patient's care.  Karlton Lemon, PharmD Candidate  02/12/2023 1:43 PM

## 2023-02-12 NOTE — Discharge Summary (Addendum)
Physician Discharge Summary   Patient: John Wolf MRN: [id number] DOB: May 01, 1955  Admit date:     02/12/2023  Discharge date: 02/12/23  Discharge Physician: Emeline General   PCP: Sherlene Shams, MD   Recommendations at discharge:   Follow-up with CT surgery ASAP GI evaluation ASAP  Discharge Diagnoses:  Symptomatic anemia secondary to large intra thoracic hematoma and possible ongoing bleeding Pericardial tamponade Hematochezia, upper GI bleed suspected    Hospital Course:  Patient with history of severe aortic regurgitation underwent biosynthetic aortic valve replacement as well as ascending aortic graft.  Stable and discharged home.  Patient has a history of A-fib and A-fib was resumed sometime this week.  Last 2 to 3 days patient developed worsening of shortness of breath and epigastric pain.  Past moderate amount of black tarry stool yesterday evening and this morning.  In the ER workup with found patient has acute blood loss anemia hemoglobin 7.6 compared to his baseline 10.8 last week and vital signs.  Run tachycardia heart rate 120s, blood pressure 70/50 on arrival.  Not hypoxic afebrile.  Patient was given 3 L IV bolus and PRBC x 3 and heart rate remained in the 120s, blood pressure improved to 110/70.  Stat CT abdominal pelvis with contrast showed no active GI bleed however a large intrathoracic hematoma was found by incidence.  Scattered gas also located inside the hematoma implying for possible early infection.  Case was discussed with [facility] transfer center given that patient has significant hemodynamic instability likely secondary to ongoing Imdur thoracic hematoma/bleeding, benefit outweigh risk to transfer patient to [facility] for emergency CT surgery evaluation.  A ER to ER transfer is to set up this afternoon.  Family informed and agreed with plan.  Plan to continue PPI and will need GI evaluation after hemodynamic stabilized  Stat echo was done at bedside, which  showed large pericardial effusion localized around the right atrium compressing the right leg with small compression of RV, positive evidence for tamponade.  Cardiology discussed with interventional cardiology, who reviewed the image and recommend patient go to [facility] for emergency CT surgery evaluation.    Pain control - [employer] Controlled Substance Reporting System database was reviewed. and patient was instructed, not to drive, operate heavy machinery, perform activities at heights, swimming or participation in water activities or provide baby-sitting services while on Pain, Sleep and Anxiety Medications; until their outpatient Physician has advised to do so again. Also recommended to not to take more than prescribed Pain, Sleep and Anxiety Medications.  Consultants: Cardiology Procedures performed: CTA chest, CTA abdomen pelvis Disposition: [facility] Diet recommendation:  NPO   DISCHARGE MEDICATION:   Discharge Exam: Filed Weights   02/12/23 0935  Weight: 119.5 kg   Eyes: PERRL, acute ill looking ENMT: Mucous membranes are moist. Posterior pharynx clear of any exudate or lesions.Normal dentition.  Neck: normal, supple, no masses, no thyromegaly Respiratory: clear to auscultation bilaterally, no wheezing, no crackles. Normal respiratory effort. No accessory muscle use.  Cardiovascular: Tachycardia regular rate and rhythm, no murmurs / rubs / gallops. No extremity edema. 2+ pedal pulses. No carotid bruits.  Abdomen: no tenderness, no masses palpated. No hepatosplenomegaly. Bowel sounds positive.  Musculoskeletal: no clubbing / cyanosis. No joint deformity upper and lower extremities. Good ROM, no contractures. Normal muscle tone.  Skin: no rashes, lesions, ulcers. No induration Neurologic: CN 2-12 grossly intact. Sensation intact, DTR normal.  Muscle strength 5/5 on both sides Psychiatric: Normal judgment and insight. Alert and  oriented x 3. Normal mood.     Condition at  discharge: serious  The results of significant diagnostics from this hospitalization (including imaging, microbiology, ancillary and laboratory) are listed below for reference.   Imaging Studies: CT Angio Abd/Pel w/ and/or w/o  Result Date: 02/12/2023 CLINICAL DATA:  GI bleed with melena. Status post aortic valve replacement with replacement of aortic root at [facility] on 02/02/2023. On anticoagulation. EXAM: CT ANGIOGRAPHY ABDOMEN AND PELVIS WITH CONTRAST AND WITHOUT CONTRAST TECHNIQUE: Multidetector CT imaging of the abdomen and pelvis was performed using the standard protocol during bolus administration of intravenous contrast. Multiplanar reconstructed images and MIPs were obtained and reviewed to evaluate the vascular anatomy. RADIATION DOSE REDUCTION: This exam was performed according to the departmental dose-optimization program which includes automated exposure control, adjustment of the mA and/or kV according to patient size and/or use of iterative reconstruction technique. CONTRAST:  OMNIPAQUE IOHEXOL 350 MG/ML SOLN COMPARISON:  Prior coronary calcium score study on 08/20/2015 FINDINGS: VASCULAR Aorta: Atherosclerosis of the abdominal aorta without aneurysm or dissection. Celiac: Normally patent. Normally patent branch vessels and branch vessel anatomy. SMA: Normally patent. Renals: Normally patent single left and paired right renal arteries. IMA: Normally patent. Inflow: Atherosclerosis of bilateral common iliac arteries. No evidence of significant obstructive or aneurysmal disease of the iliac arteries. Proximal Outflow: Widely patent bilateral common femoral arteries and femoral bifurcations. Veins: Venous phase imaging demonstrates normally patent venous structures in the abdomen and pelvis. Review of the MIP images confirms the above findings. NON-VASCULAR Lower chest: Incompletely visualized large lentiform shaped complex fluid collection in the right cardiophrenic  space abutting the right heart measures up to approximately 12.2 x 6.6 cm in transverse dimensions in the lower chest. Density is likely consistent with hemorrhagic fluid and the abnormality abuts the right atrium and right ventricle exerting mass effect on the right atrium. There are a few small foci of air within the abnormality. Given recent aortic surgery, this most likely represents a postoperative hematoma and infected or partially infected hematoma cannot be excluded given the small foci of air present. Due to incomplete visualization, a CTA of the chest with timing to thoracic aortic opacification would be appropriate for further evaluation. Hepatobiliary: No focal liver abnormality is seen. No gallstones, gallbladder wall thickening, or biliary dilatation. Pancreas: Unremarkable. No pancreatic ductal dilatation or surrounding inflammatory changes. Spleen: Normal in size without focal abnormality. Adrenals/Urinary Tract: No adrenal masses. Both kidneys demonstrate benign-appearing Bosniak 1 parapelvic cysts which require no follow-up. The bladder is unremarkable. Stomach/Bowel: Bowel shows no evidence of obstruction, ileus, inflammation or lesion. Tiny calcified appendicolith within the tip of a nondilated and non-inflamed appendix. No free air identified. On arterial and venous phases of imaging, there is no evidence of contrast extravasation into bowel to suggest active gastrointestinal bleeding during the study. No ulcers or varices identified. Lymphatic: No enlarged abdominal or pelvic lymph nodes. Reproductive: Prostate is unremarkable. Other: No abdominal wall hernia or abnormality. No abdominopelvic ascites. Musculoskeletal: Degenerative disc disease of the lumbar spine with associated leftward convex scoliosis. IMPRESSION: 1. No evidence of active gastrointestinal bleeding during the study. 2. Incompletely visualized large lentiform shaped complex fluid collection in the right cardiophrenic space  abutting the right heart measures up to approximately 12.2 x 6.6 cm in transverse dimensions in the lower chest. Density is likely consistent with hemorrhagic fluid and the abnormality abuts the right atrium and right ventricle exerting mass effect on the right atrium. There are a few small  foci of air within the abnormality. Given recent aortic surgery, this most likely represents a postoperative hematoma and infected or partially infected hematoma cannot be excluded given the small foci of air present. Due to incomplete visualization, a CTA of the chest with timing to thoracic aortic opacification would be appropriate for further evaluation. 3. Atherosclerosis of the abdominal aorta and bilateral common iliac arteries without aneurysm or dissection. 4. These results were called by telephone at the time of interpretation on 02/12/2023 at 12:40 PM to provider [facility] , who verbally acknowledged these results. Electronically Signed   By: Irish Lack M.D.   On: 02/12/2023 12:41    Microbiology: No results found for this or any previous visit.  Labs: CBC: Recent Labs  Lab 02/12/23 0941 02/12/23 1424  WBC 13.4*  --   NEUTROABS 10.3*  --   HGB 7.6* 9.7*  HCT 23.0* 29.6*  MCV 93.1  --   PLT 310  --    Basic Metabolic Panel: Recent Labs  Lab 02/12/23 0941  NA 136  K 5.2*  CL 106  CO2 23  GLUCOSE 124*  BUN 43*  CREATININE 0.88  CALCIUM 8.0*   Liver Function Tests: Recent Labs  Lab 02/12/23 0941  AST 27  ALT 32  ALKPHOS 47  BILITOT 0.2*  PROT 5.2*  ALBUMIN 2.5*   CBG: Recent Labs  Lab 02/12/23 0947  GLUCAP 103*    Discharge time spent: greater than 30 minutes.  Signed: Emeline General, MD [facility] 02/12/2023

## 2023-02-12 NOTE — Progress Notes (Signed)
  Echocardiogram 2D Echocardiogram has been performed.  John Wolf 02/12/2023, 3:19 PM

## 2023-02-12 NOTE — Progress Notes (Addendum)
CTA abdominal pelvis showed no active GI bleed but did find a large hematoma measuring 12 x 6.6 cm in the lower chest, incompletely visualized.  There is no signs of the mass effect pushing the right atrium.  A few foci of air trapped inside the hematoma raise a concern about infection.  As per recommendation from radiology, a dedicated CTA chest ordered to further evaluate thoracic hematoma.  Current vital signs pulse 118, blood pressure 100/78.  Patient continued to complain about feeling shortness of breath.  Third PRBC running. Last dose of Eliquis was yesterday evening and no indication for reversal at this time.  Contact [facility] transfer center for possible transfer patient to [facility] ICU/CT surgery service.  Discussed with on-call cardiology, about possible stat echo.  Cardiology reviewed the CT image and so far did not see any signs of pericardial effusion.  Cardiology will contact IR for possible intervention such as pericardiocentesis if indicated. Cardiology unsure whether Echo tech a

## 2023-02-12 NOTE — ED Provider Notes (Signed)
[facility] Provider Note    Event Date/Time   First MD Initiated Contact with Patient 02/12/23 0930     (approximate)   History   Rectal Bleeding (Hypovolemic shock)   HPI  John Wolf is a 66 y.o. male who presents to the emergency department today because of concerns for possible rectal bleeding and weakness.  The patient states he has been feeling weak over the past couple of days.  Starting yesterday he noticed black and tarry stool.  He recently was at [facility] and had recent discharge after aortic valve replacement.  Patient is on Eliquis.  He denies history of GI bleed in the past.  He denies any focal weakness states that it is generalized.     Physical Exam   Triage Vital Signs: ED Triage Vitals  Encounter Vitals Group     BP      Systolic BP Percentile      Diastolic BP Percentile      Pulse      Resp      Temp      Temp src      SpO2      Weight      Height      Head Circumference      Peak Flow      Pain Score      Pain Loc      Pain Education      Exclude from Growth Chart     Most recent vital signs: There were no vitals filed for this visit.  General: Awake, alert, oriented. CV:  Good peripheral perfusion. Tachycardia. Resp:  Normal effort. Lungs clear. Abd:  No distention.  Other:  Pale mucous membranes.   ED Results / Procedures / Treatments   Labs (all labs ordered are listed, but only abnormal results are displayed) Labs Reviewed  CBC WITH DIFFERENTIAL/PLATELET - Abnormal; Notable for the following components:      Result Value   WBC 13.4 (*)    RBC 2.47 (*)    Hemoglobin 7.6 (*)    HCT 23.0 (*)    Neutro Abs 10.3 (*)    Abs Immature Granulocytes 0.32 (*)    All other components within normal limits  CBG MONITORING, ED - Abnormal; Notable for the following components:   Glucose-Capillary 103 (*)    All other components within normal limits  COMPREHENSIVE METABOLIC PANEL  TYPE AND SCREEN  PREPARE RBC  (CROSSMATCH)  ABO/RH  TROPONIN I (HIGH SENSITIVITY)     EKG  I, Phineas Semen, attending physician, personally viewed and interpreted this EKG  EKG Time: 0939 Rate: 125 Rhythm: sinus tachycardia vs atrial flutter Axis: left axis deviation Intervals: qtc 545 QRS: RBBB, LAFB ST changes: no st elevation Impression: abnormal ekg    RADIOLOGY None  PROCEDURES:  Critical Care performed: Yes  CRITICAL CARE Performed by: Phineas Semen   Total critical care time: 40 minutes  Critical care time was exclusive of separately billable procedures and treating other patients.  Critical care was necessary to treat or prevent imminent or life-threatening deterioration.  Critical care was time spent personally by me on the following activities: development of treatment plan with patient and/or surrogate as well as nursing, discussions with consultants, evaluation of patient's response to treatment, examination of patient, obtaining history from patient or surrogate, ordering and performing treatments and interventions, ordering and review of laboratory studies, ordering and review of radiographic studies, pulse oximetry and re-evaluation of patient's condition.  Procedures    MEDICATIONS ORDERED IN ED: Medications  0.9 %  sodium chloride infusion (Manually program via Guardrails IV Fluids) (has no administration in time range)  pantoprazole (PROTONIX) injection 40 mg (has no administration in time range)    Followed by  pantoprazole (PROTONIX) injection 40 mg (has no administration in time range)    Followed by  pantoprazole (PROTONIX) injection 40 mg (has no administration in time range)     IMPRESSION / MDM / ASSESSMENT AND PLAN / ED COURSE  I reviewed the triage vital signs and the nursing notes.                              Differential diagnosis includes, but is not limited to, gi bleed, anemia, infection  Patient's presentation is most consistent with acute  presentation with potential threat to life or bodily function.   The patient is on the cardiac monitor to evaluate for evidence of arrhythmia and/or significant heart rate changes.  Patient presented to the emergency department today because of concerns for possible GI bleeding and weakness.  Initial exam is concerning for tachycardia and hypotension.  Patient did have pale mucous membranes.  I would have concern for significant GI bleed.  Did order 2 units of emergency release blood.  Blood work does show a drop of abruptly 3 from recent blood work done at [facility].  I am considering reversing the patient's Eliquis however given recent aortic valve replacement I am hesitant to do so until we see if blood products help resolve some of the patient's vital sign abnormalities.  Patients blood pressure did improve after multiple units of emergency blood. At this time do have some concern about attempting transfer back to [facility] given concern for instability. Will plan on admission to the hospitalist service for further work up and management.       FINAL CLINICAL IMPRESSION(S) / ED DIAGNOSES   Final diagnoses:  Acute GI bleeding  Anemia, unspecified type      Note:  This document was prepared using Dragon voice recognition software and may include unintentional dictation errors.    Phineas Semen, MD 02/13/23 [phone]

## 2023-02-12 NOTE — ED Notes (Signed)
Called [facility] for transport per RN.  No truck available.  Called [facility] spoke with Selena Batten, requesting transport assistance.  States we will receive a call once truck is in route.

## 2023-02-12 NOTE — Consult Note (Signed)
Cardiology Consultation   Patient ID: John Wolf MRN: [id number]; DOB: 03-28-1956  Admit date: 02/12/2023 Date of Consult: 02/12/2023  PCP:  Sherlene Shams, MD   [facility] Providers Cardiologist:  None   {[facility] CT surgery: [facility]  Patient Profile:   John Wolf is a 67 y.o. male with a hx of severe aortic regurgitation s/p repair who is being seen 02/12/2023 for the evaluation of pericardial effusion at the request of Dr. Chipper Herb.  History of Present Illness:   John Wolf is a 67 year old male with history of severe aortic valve regurgitation s/p aortic valve replacement with bioprosthetic valve 02/01/2023, ascending aorta dilatation s/p Wheat procedure 02/01/2023, A-fib s/p maze plus LAA clip 02/01/2023, TEE DC cardioversion 02/08/2023, hypertension, hyperlipidemia, obesity, untreated sleep apnea who presents with blood in stool.  Patient had surgery for aortic valve repair with bioprosthetic valve aortic root repair maze procedure and left atrial appendage clip 10 days ago at [facility].  Underwent TEE guided cardioversion 4 days ago successfully was started on amiodarone prior to discharge.  Takes Eliquis and aspirin 80 mg daily. heart rates on discharge where in the 70s.  He noted blood in stool yesterday and also this morning.  Check pulse rate at home noted to be in the 120s.  EMS was called and patient brought to the ED.  On admission, hemoglobin was 7.6.(10.8 on discharge.)  EKG shows atrial flutter heart rate 125.  Troponin 252.  Patient endorses shortness of breath, denies chest pain.  Eliquis has been held, CT abdomen showed complex fluid abutting the right costophrenic space.  Blood pressure low normal with systolic in the 100s.  Transfuse 3 to 4 units of packed red blood cells.  Patient mentating okay.  Due to recent surgery and anemia, transfer to [facility] was placed, currently pending.   Past Medical History:   Diagnosis Date   Acquired pes planus    Arthritis    Benign neoplasm of colon    History of left knee replacement    Hyperlipidemia    Hypertension    Obesity (BMI 30-39.9)    with 70 lb weight loss, ongoing   Onychia and paronychia of toe    Sleep apnea, obstructive 04/26/2000   wears CPAP    Past Surgical History:  Procedure Laterality Date   athroplasty left shoulder     COLONOSCOPY WITH PROPOFOL N/A 08/27/2021   Procedure: COLONOSCOPY WITH PROPOFOL;  Surgeon: Midge Minium, MD;  Location: [facility];  Service: Endoscopy;  Laterality: N/A;   left shoulder     shoulder dislocation, rotator    POLYPECTOMY N/A 08/27/2021   Procedure: POLYPECTOMY;  Surgeon: Midge Minium, MD;  Location: [facility];  Service: Endoscopy;  Laterality: N/A;     Home Medications:  Prior to Admission medications   Medication Sig Start Date End Date Taking? Authorizing Provider  ALPRAZolam (XANAX) 0.25 MG tablet Take 1 tablet (0.25 mg total) by mouth at bedtime as needed for anxiety. Or insomnia 02/11/23   Sherlene Shams, MD  amiodarone (PACERONE) 200 MG tablet Take 200 mg by mouth daily. Taking 2 tablets daily 02/08/23 05/09/23  [provider]  apixaban (ELIQUIS) 5 MG TABS tablet Take 5 mg by mouth 2 (two) times daily. 01/10/23   [provider]  aspirin 81 MG chewable tablet Chew 81 mg by mouth daily. 02/08/23 02/08/24  [provider]  atorvastatin (LIPITOR) 80 MG tablet Take 80 mg by mouth daily.  02/08/23 02/08/24  [provider]  furosemide (LASIX) 40 MG tablet Take 40 mg by mouth daily. 01/10/23   [provider]  metFORMIN (GLUCOPHAGE-XR) 500 MG 24 hr tablet Take 500 mg by mouth daily with breakfast. Patient not taking: Reported on 02/11/2023 01/27/23 01/27/24  [provider]  metoprolol tartrate (LOPRESSOR) 25 MG tablet Take 25 mg by mouth 2 (two) times daily. 02/08/23 02/08/24  [provider]  mirtazapine (REMERON)  15 MG tablet 1/2 to 1 tablet 30 minutes before bedtime daily 02/11/23   Sherlene Shams, MD  oxyCODONE (OXY IR/ROXICODONE) 5 MG immediate release tablet Take by mouth. 02/08/23 02/13/23  [provider]  spironolactone (ALDACTONE) 25 MG tablet Take 1 tablet by mouth daily. 02/08/23 02/08/24  [provider]  tamsulosin (FLOMAX) 0.4 MG CAPS capsule Take 0.4 mg by mouth daily. Patient not taking: Reported on 02/11/2023 02/08/23 02/08/24  [provider]    Inpatient Medications: Scheduled Meds:  sodium chloride   Intravenous Once   pantoprazole (PROTONIX) IV  40 mg Intravenous Q6H   Followed by   Melene Muller ON 02/15/2023] pantoprazole (PROTONIX) IV  40 mg Intravenous Q12H   Continuous Infusions:  piperacillin-tazobactam (ZOSYN)  IV     [START ON 02/13/2023] vancomycin     vancomycin     Followed by   vancomycin     PRN Meds:   Allergies:    Allergies  Allergen Reactions   Quinolones Other (See Comments)    Fluoroquinolone antibiotics should be avoided in patients with history of aortic aneurysm if alternative therapy is available  ([email or web address])    Social History:   Social History   Socioeconomic History   Marital status: Married    Spouse name: Not on file   Number of children: 1   Years of education: Not on file   Highest education level: Not on file  Occupational History   Occupation: [employer]  Tobacco Use   Smoking status: Never   Smokeless tobacco: Never  Vaping Use   Vaping status: Never Used  Substance and Sexual Activity   Alcohol use: Yes    Comment: occasionally   Drug use: Never   Sexual activity: Not on file  Other Topics Concern   Not on file  Social History Narrative   Right handed   One story home   Coffee q am   Social Determinants of Health   Financial Resource Strain: Low Risk  (02/09/2023)   Received from [facility]   Overall Financial Resource  Strain (CARDIA)    Difficulty of Paying Living Expenses: Not hard at all  Food Insecurity: No Food Insecurity (02/09/2023)   Received from [facility]   Hunger Vital Sign    Worried About Running Out of Food in the Last Year: Never true    Ran Out of Food in the Last Year: Never true  Transportation Needs: No Transportation Needs (02/09/2023)   Received from [facility] Transportation    In the past 12 months, has lack of transportation kept you from medical appointments or from getting medications?: No    Lack of Transportation (Non-Medical): No  Physical Activity: Not on file  Stress: Not on file  Social Connections: Not on file  Intimate Partner Violence: Not on file    Family History:    Family History  Problem Relation Age of Onset   Heart disease Mother    Stroke Father  Stroke Brother    Hypertension Brother    Cancer Brother 12       AML     ROS:  Please see the history of present illness.   All other ROS reviewed and negative.     Physical Exam/Data:   Vitals:   02/12/23 1500 02/12/23 1515 02/12/23 1530 02/12/23 1542  BP: 101/80 100/82 99/83 104/70  Pulse: (!) 122  (!) 124 (!) 122  Resp: 17 17 15 19   Temp:    98 F (36.7 C)  TempSrc:      SpO2: 97%  100% 100%  Weight:      Height:        Intake/Output Summary (Last 24 hours) at 02/12/2023 1544 Last data filed at 02/12/2023 1100 Gross per 24 hour  Intake 3000 ml  Output --  Net 3000 ml      02/12/2023    9:35 AM 02/11/2023    5:01 PM 06/01/2022    9:21 AM  Last 3 Weights  Weight (lbs) 263 lb 8 oz 263 lb 8 oz 268 lb  Weight (kg) 119.523 kg 119.523 kg 121.564 kg     Body mass index is 41.27 kg/m.  General: Appears weak and tired. HEENT: normal Neck: no JVD Vascular: No carotid bruits; Distal pulses 2+ bilaterally Cardiac: tachycardic, regular Lungs:  diminished breath sounds, poor inspiratory effort Abd: soft, nontender, no hepatomegaly   Ext: no edema Musculoskeletal:  No deformities, BUE and BLE strength normal and equal Skin: warm and dry  Neuro:  CNs 2-12 intact, no focal abnormalities noted Psych:  Normal affect   EKG:  The EKG was personally reviewed and demonstrates: Atrial flutter Telemetry:  Telemetry was personally reviewed and demonstrates: Atrial flutter  Relevant CV Studies: TTE 02/12/2023 1. Left ventricular ejection fraction, by estimation, is 35 to 40%. The  left ventricle has moderately decreased function. The left ventricle  demonstrates global hypokinesis. There is mild left ventricular  hypertrophy. Left ventricular diastolic  parameters are indeterminate.   2. Right ventricular systolic function is low normal. The right  ventricular size is normal.   3. Large pericardial effusion. The pericardial effusion is localized near  the right atrium. Findings are consistent with cardiac tamponade.   4. The mitral valve is normal in structure. Mild to moderate mitral valve  regurgitation.   5. The aortic valve was not well visualized. Aortic valve regurgitation  is not visualized.   6. Aortic dilatation noted. There is moderate dilatation of the aortic  root, measuring 46 mm.   7. The inferior vena cava is dilated in size with <50% respiratory  variability, suggesting right atrial pressure of 15 mmHg.   Laboratory Data:  High Sensitivity Troponin:   Recent Labs  Lab 02/12/23 0941 02/12/23 1424  TROPONINIHS 252* 255*     Chemistry Recent Labs  Lab 02/12/23 0941  NA 136  K 5.2*  CL 106  CO2 23  GLUCOSE 124*  BUN 43*  CREATININE 0.88  CALCIUM 8.0*  GFRNONAA >60  ANIONGAP 7    Recent Labs  Lab 02/12/23 0941  PROT 5.2*  ALBUMIN 2.5*  AST 27  ALT 32  ALKPHOS 47  BILITOT 0.2*   Lipids No results for input(s): "CHOL", "TRIG", "HDL", "LABVLDL", "LDLCALC", "CHOLHDL" in the last 168 hours.  Hematology Recent Labs  Lab 02/12/23 0941 02/12/23 1424  WBC 13.4*  --   RBC 2.47*  --    HGB 7.6* 9.7*  HCT 23.0* 29.6*  MCV 93.1  --  MCH 30.8  --   MCHC 33.0  --   RDW 13.2  --   PLT 310  --    Thyroid No results for input(s): "TSH", "FREET4" in the last 168 hours.  BNPNo results for input(s): "BNP", "PROBNP" in the last 168 hours.  DDimer No results for input(s): "DDIMER" in the last 168 hours.   Radiology/Studies:  ECHOCARDIOGRAM COMPLETE  Result Date: 02/12/2023    ECHOCARDIOGRAM REPORT   Patient Name:   John Wolf [facility] Date of Exam: 02/12/2023 Medical Rec #:  [id number]         Height:       67.0 in Accession #:    [id number]        Weight:       263.5 lb Date of Birth:  [birth year]         BSA:          2.274 m Patient Age:    67 years          BP:           110/80 mmHg Patient Gender: M                 HR:           123 bpm. Exam Location:  [facility] Procedure: 2D Echo STAT ECHO Indications:     Tamponade I31.4  History:         Patient has prior history of Echocardiogram examinations, most                  recent 08/26/2022.  Sonographer:     Overton Mam RDCS, FASE Referring Phys:  [id number] Emeline General Diagnosing Phys: Debbe Odea MD IMPRESSIONS  1. Left ventricular ejection fraction, by estimation, is 35 to 40%. The left ventricle has moderately decreased function. The left ventricle demonstrates global hypokinesis. There is mild left ventricular hypertrophy. Left ventricular diastolic parameters are indeterminate.  2. Right ventricular systolic function is low normal. The right ventricular size is normal.  3. Large pericardial effusion. The pericardial effusion is localized near the right atrium. Findings are consistent with cardiac tamponade.  4. The mitral valve is normal in structure. Mild to moderate mitral valve regurgitation.  5. The aortic valve was not well visualized. Aortic valve regurgitation is not visualized.  6. Aortic dilatation noted. There is moderate dilatation of the aortic root, measuring 46 mm.  7. The inferior vena cava is dilated in size with  <50% respiratory variability, suggesting right atrial pressure of 15 mmHg. FINDINGS  Left Ventricle: Left ventricular ejection fraction, by estimation, is 35 to 40%. The left ventricle has moderately decreased function. The left ventricle demonstrates global hypokinesis. The left ventricular internal cavity size was normal in size. There is mild left ventricular hypertrophy. Left ventricular diastolic parameters are indeterminate. Right Ventricle: The right ventricular size is normal. No increase in right ventricular wall thickness. Right ventricular systolic function is low normal. Left Atrium: Left atrial size was normal in size. Right Atrium: Right atrial size was normal in size. Pericardium: A large pericardial effusion is present. The pericardial effusion is localized near the right atrium. There is excessive respiratory variation in the tricuspid valve spectral Doppler velocities. There is evidence of cardiac tamponade. Mitral Valve: The mitral valve is normal in structure. Mild to moderate mitral valve regurgitation. Tricuspid Valve: The tricuspid valve is normal in structure. Tricuspid valve regurgitation is mild. Aortic Valve: The aortic valve was not well visualized. Aortic valve regurgitation is  not visualized. Aortic valve mean gradient measures 4.0 mmHg. Aortic valve peak gradient measures 8.8 mmHg. Aortic valve area, by VTI measures 1.24 cm. Pulmonic Valve: The pulmonic valve was not well visualized. Pulmonic valve regurgitation is not visualized. Aorta: Aortic dilatation noted. There is moderate dilatation of the aortic root, measuring 46 mm. Venous: The inferior vena cava is dilated in size with less than 50% respiratory variability, suggesting right atrial pressure of 15 mmHg. IAS/Shunts: No atrial level shunt detected by color flow Doppler.  LEFT VENTRICLE PLAX 2D LVIDd:         5.50 cm LVIDs:         4.20 cm LV PW:         1.40 cm LV IVS:        1.60 cm LVOT diam:     2.00 cm LV SV:         28 LV  SV Index:   12 LVOT Area:     3.14 cm  LV Volumes (MOD) LV vol d, MOD A4C: 103.0 ml LV vol s, MOD A4C: 71.5 ml LV SV MOD A4C:     103.0 ml RIGHT VENTRICLE RV Basal diam:  2.90 cm RV S prime:     11.30 cm/s TAPSE (M-mode): 1.6 cm LEFT ATRIUM           Index        RIGHT ATRIUM           Index LA diam:      4.30 cm 1.89 cm/m   RA Area:     11.50 cm LA Vol (A2C): 32.5 ml 14.29 ml/m  RA Volume:   19.40 ml  8.53 ml/m LA Vol (A4C): 46.5 ml 20.45 ml/m  AORTIC VALVE                    PULMONIC VALVE AV Area (Vmax):    1.31 cm     PV Vmax:        0.88 m/s AV Area (Vmean):   1.49 cm     PV Peak grad:   3.1 mmHg AV Area (VTI):     1.24 cm     RVOT Peak grad: 2 mmHg AV Vmax:           148.00 cm/s AV Vmean:          84.500 cm/s AV VTI:            0.227 m AV Peak Grad:      8.8 mmHg AV Mean Grad:      4.0 mmHg LVOT Vmax:         61.50 cm/s LVOT Vmean:        40.000 cm/s LVOT VTI:          0.090 m LVOT/AV VTI ratio: 0.39  AORTA Ao Root diam: 4.60 cm Ao Asc diam:  2.90 cm MITRAL VALVE               TRICUSPID VALVE MV Area (PHT): 6.07 cm    TR Peak grad:   18.0 mmHg MV Decel Time: 125 msec    TR Vmax:        212.00 cm/s MV E velocity: 68.70 cm/s MV A velocity: 69.20 cm/s  SHUNTS MV E/A ratio:  0.99        Systemic VTI:  0.09 m  Systemic Diam: 2.00 cm Debbe Odea MD Electronically signed by Debbe Odea MD Signature Date/Time: 02/12/2023/3:38:51 PM    Final    CT ANGIO CHEST AORTA W/CM &/OR WO/CM  Result Date: 02/12/2023 CLINICAL DATA:  Evaluate cardiac fluid collection partially visualized on prior abdomen and pelvis CT EXAM: CT ANGIOGRAPHY CHEST WITH CONTRAST TECHNIQUE: Multidetector CT imaging of the chest was performed using the standard protocol during bolus administration of intravenous contrast. Multiplanar CT image reconstructions and MIPs were obtained to evaluate the vascular anatomy. RADIATION DOSE REDUCTION: This exam was performed according to the departmental  dose-optimization program which includes automated exposure control, adjustment of the mA and/or kV according to patient size and/or use of iterative reconstruction technique. CONTRAST:  75mL OMNIPAQUE IOHEXOL 350 MG/ML SOLN COMPARISON:  CTA abdomen and pelvis dated February 12, 2023 FINDINGS: Cardiovascular: Prior aortic valve replacement and graft repair of the ascending thoracic aorta. No evidence of pseudoaneurysm. Cardiomegaly. Mild pericardial thickening and soft tissue stranding of the mediastinum. Large perigraft fluid collection which extends inferiorly along the right heart border with locules of air and hyperdense components. Measures 8.3 x 8.6 cm in axial dimension and 14.1 cm in craniocaudal dimension with mass effect on the right atrium. Areas of hyperdensity were seen within the collection on prior noncontrast exam, consistent with blood products. Mediastinum/Nodes: Esophagus and thyroid are unremarkable. No enlarged lymph nodes seen in the chest. Lungs/Pleura: Central airways are patent. Right-greater-than-left lower lobe atelectasis. No consolidation, pleural effusion or pneumothorax. Upper Abdomen: Left renal sinus cysts.  No acute abnormality. Musculoskeletal: Prior median sternotomy with intact sternal wires. Prior left total shoulder arthroplasty. No aggressive appearing osseous lesions. Review of the MIP images confirms the above findings. IMPRESSION: 1. Large perigraft fluid collection with locules of air and hyperdense components which extends inferiorly along the right heart border causing mass effect on the right atrium. Likely postop hematoma given recent aortic surgery, infection can not be excluded. 2. Prior aortic valve replacement and graft repair of the ascending thoracic aorta. No evidence of pseudoaneurysm. Electronically Signed   By: Allegra Lai M.D.   On: 02/12/2023 15:17   CT Angio Abd/Pel w/ and/or w/o  Result Date: 02/12/2023 CLINICAL DATA:  GI bleed with melena.  Status post aortic valve replacement with replacement of aortic root at [facility] on 02/02/2023. On anticoagulation. EXAM: CT ANGIOGRAPHY ABDOMEN AND PELVIS WITH CONTRAST AND WITHOUT CONTRAST TECHNIQUE: Multidetector CT imaging of the abdomen and pelvis was performed using the standard protocol during bolus administration of intravenous contrast. Multiplanar reconstructed images and MIPs were obtained and reviewed to evaluate the vascular anatomy. RADIATION DOSE REDUCTION: This exam was performed according to the departmental dose-optimization program which includes automated exposure control, adjustment of the mA and/or kV according to patient size and/or use of iterative reconstruction technique. CONTRAST:  OMNIPAQUE IOHEXOL 350 MG/ML SOLN COMPARISON:  Prior coronary calcium score study on 08/20/2015 FINDINGS: VASCULAR Aorta: Atherosclerosis of the abdominal aorta without aneurysm or dissection. Celiac: Normally patent. Normally patent branch vessels and branch vessel anatomy. SMA: Normally patent. Renals: Normally patent single left and paired right renal arteries. IMA: Normally patent. Inflow: Atherosclerosis of bilateral common iliac arteries. No evidence of significant obstructive or aneurysmal disease of the iliac arteries. Proximal Outflow: Widely patent bilateral common femoral arteries and femoral bifurcations. Veins: Venous phase imaging demonstrates normally patent venous structures in the abdomen and pelvis. Review of the MIP images confirms the above findings. NON-VASCULAR Lower chest: Incompletely visualized large lentiform shaped complex  fluid collection in the right cardiophrenic space abutting the right heart measures up to approximately 12.2 x 6.6 cm in transverse dimensions in the lower chest. Density is likely consistent with hemorrhagic fluid and the abnormality abuts the right atrium and right ventricle exerting mass effect on the right atrium. There are a few small  foci of air within the abnormality. Given recent aortic surgery, this most likely represents a postoperative hematoma and infected or partially infected hematoma cannot be excluded given the small foci of air present. Due to incomplete visualization, a CTA of the chest with timing to thoracic aortic opacification would be appropriate for further evaluation. Hepatobiliary: No focal liver abnormality is seen. No gallstones, gallbladder wall thickening, or biliary dilatation. Pancreas: Unremarkable. No pancreatic ductal dilatation or surrounding inflammatory changes. Spleen: Normal in size without focal abnormality. Adrenals/Urinary Tract: No adrenal masses. Both kidneys demonstrate benign-appearing Bosniak 1 parapelvic cysts which require no follow-up. The bladder is unremarkable. Stomach/Bowel: Bowel shows no evidence of obstruction, ileus, inflammation or lesion. Tiny calcified appendicolith within the tip of a nondilated and non-inflamed appendix. No free air identified. On arterial and venous phases of imaging, there is no evidence of contrast extravasation into bowel to suggest active gastrointestinal bleeding during the study. No ulcers or varices identified. Lymphatic: No enlarged abdominal or pelvic lymph nodes. Reproductive: Prostate is unremarkable. Other: No abdominal wall hernia or abnormality. No abdominopelvic ascites. Musculoskeletal: Degenerative disc disease of the lumbar spine with associated leftward convex scoliosis. IMPRESSION: 1. No evidence of active gastrointestinal bleeding during the study. 2. Incompletely visualized large lentiform shaped complex fluid collection in the right cardiophrenic space abutting the right heart measures up to approximately 12.2 x 6.6 cm in transverse dimensions in the lower chest. Density is likely consistent with hemorrhagic fluid and the abnormality abuts the right atrium and right ventricle exerting mass effect on the right atrium. There are a few small foci of  air within the abnormality. Given recent aortic surgery, this most likely represents a postoperative hematoma and infected or partially infected hematoma cannot be excluded given the small foci of air present. Due to incomplete visualization, a CTA of the chest with timing to thoracic aortic opacification would be appropriate for further evaluation. 3. Atherosclerosis of the abdominal aorta and bilateral common iliac arteries without aneurysm or dissection. 4. These results were called by telephone at the time of interpretation on 02/12/2023 at 12:40 PM to provider [facility] , who verbally acknowledged these results. Electronically Signed   By: Irish Lack M.D.   On: 02/12/2023 12:41     Assessment and Plan:   Pericardial effusion with tamponade physiology on echo -Likely hematoma due to recent surgery, on Eliquis. -RA compression, respiratory variation noted on echo. -Discussed with interventional cardiology, will not be able for pericardiocentesis due to fluid.blood localized in RA region. -Continue transfusion to keep hemoglobin over 8.  Last H&H 9.7 after 3 to 4 units PRBCs.. -Patient is hemodynamically stable.  MAP over 65. -Transfer to [facility] for surgical intervention pending. -Agree with holding anticoagulation.  2.  Cardiomyopathy EF 35% -Likely tachycardia induced from a flutter -Cardioversion, antiarrhythmics after management of effusion.  3.  Obesity, likely untreated sleep apnea -Will need sleep study and therapy for OSA if diagnosed.  Signed, Debbe Odea, MD  02/12/2023 3:44 PM

## 2023-02-13 ENCOUNTER — Encounter: Payer: Self-pay | Admitting: Internal Medicine

## 2023-02-13 DIAGNOSIS — S2020XA Contusion of thorax, unspecified, initial encounter: Secondary | ICD-10-CM | POA: Insufficient documentation

## 2023-02-13 LAB — TYPE AND SCREEN
ABO/RH(D): A POS
Antibody Screen: NEGATIVE
Unit division: 0
Unit division: 0
Unit division: 0
Unit division: 0

## 2023-02-13 LAB — BPAM RBC
Blood Product Expiration Date: 202411162359
Blood Product Expiration Date: 202411162359
Blood Product Expiration Date: 202411172359
Blood Product Expiration Date: 202411172359
ISSUE DATE / TIME: 202410190956
ISSUE DATE / TIME: 202410190956
ISSUE DATE / TIME: 202410190956
ISSUE DATE / TIME: 202410190956
Unit Type and Rh: 5100
Unit Type and Rh: 5100
Unit Type and Rh: 5100
Unit Type and Rh: 5100

## 2023-02-16 ENCOUNTER — Telehealth: Payer: Self-pay

## 2023-02-16 NOTE — Transitions of Care (Post Inpatient/ED Visit) (Signed)
02/16/2023  Name: John Wolf MRN: [id number] DOB: 12-Oct-1955  Today's TOC FU Call Status: Today's TOC FU Call Status:: Successful TOC FU Call Completed TOC FU Call Complete Date: 02/16/23 Patient's Name and Date of Birth confirmed.  Transition Care Management Follow-up Telephone Call Date of Discharge: 02/15/23 Discharge Facility: Other ([facility]) Name [facility] ([facility]) [facility]charge Facility: [facility] Type of Discharge: Inpatient Admission Primary Inpatient Discharge Diagnosis:: Tachycardiac How have you been since you were released from the hospital?: Better Any questions or concerns?: No  Items Reviewed: Did you receive and understand the discharge instructions provided?: Yes Medications obtained,verified, and reconciled?: Yes (Medications Reviewed) Any new allergies since your discharge?: No Dietary orders reviewed?: NA Do you have support at home?: Yes People in Home: significant other Name of Support/Comfort Primary Source: [name]  Medications Reviewed Today: Medications Reviewed Today     Reviewed by Redge Gainer, RN (Case Manager) on 02/16/23 at 1002  Med List Status: <None>   Medication Order Taking? Sig Documenting Provider Last Dose Status Informant  ALPRAZolam (XANAX) 0.25 MG tablet [id number]  Take 1 tablet (0.25 mg total) by mouth at bedtime as needed for anxiety. Or insomnia Sherlene Shams, MD  Active   amiodarone (PACERONE) 200 MG tablet [id number] No Take 200 mg by mouth daily. Taking 2 tablets daily [provider] Taking Active   apixaban (ELIQUIS) 5 MG TABS tablet [id number] No Take 5 mg by mouth 2 (two) times daily. [provider] Taking Active   aspirin 81 MG chewable tablet [id number] No Chew 81 mg by mouth daily. [provider] Taking Active   atorvastatin (LIPITOR) 80 MG tablet [id number] No Take 80 mg by mouth daily. [provider] Taking Active   furosemide (LASIX) 40 MG tablet [id number] No  Take 40 mg by mouth daily. [provider] Taking Active   metFORMIN (GLUCOPHAGE-XR) 500 MG 24 hr tablet [id number] No Take 500 mg by mouth daily with breakfast.  Patient not taking: Reported on 02/11/2023   [provider] Not Taking Active            Med Note Kate Sable, JESSICA   Fri Feb 11, 2023  5:06 PM) On hold  metoprolol tartrate (LOPRESSOR) 25 MG tablet [id number] No Take 25 mg by mouth 2 (two) times daily. [provider] Taking Active   mirtazapine (REMERON) 15 MG tablet [id number]  1/2 to 1 tablet 30 minutes before bedtime daily Sherlene Shams, MD  Active   spironolactone (ALDACTONE) 25 MG tablet [id number] No Take 1 tablet by mouth daily. [provider] Taking Active   tamsulosin (FLOMAX) 0.4 MG CAPS capsule [id number] No Take 0.4 mg by mouth daily.  Patient not taking: Reported on 02/11/2023   [provider] Not Taking Active            Med Note Kate Sable, JESSICA   Fri Feb 11, 2023  5:06 PM) On hold            Home Care and Equipment/Supplies: Were Home Health Services Ordered?: Yes Name of Home Health Agency:: The patient does not know the name of the agency Has Agency set up a time to come to your home?: Yes First Home Health Visit Date: 02/16/23 Any new equipment or medical supplies ordered?: NA  Functional Questionnaire: Do you need assistance with bathing/showering or dressing?: No Do you need assistance with meal preparation?: No Do you need assistance with eating?: No Do you have difficulty maintaining continence: No Do you  need assistance with getting out of bed/getting out of a chair/moving?: No Do you have difficulty managing or taking your medications?: No  Follow up appointments reviewed: PCP Follow-up appointment confirmed?: NA Specialist Hospital Follow-up appointment confirmed?: Yes Date of Specialist follow-up appointment?: 02/22/23 Follow-Up Specialty Provider:: Cardiology Do you need transportation to  your follow-up appointment?: No Do you understand care options if your condition(s) worsen?: Yes-patient verbalized understanding  SDOH Interventions Today    Flowsheet Row Most Recent Value  SDOH Interventions   Food Insecurity Interventions Intervention Not Indicated  Transportation Interventions Intervention Not Indicated      The patient was discharged home from [facility] with Home Health. The patient does not know the name of the Home Care agency. He lives in the basement of his girlfriend's house. She helps to care for him and her 73 year old mother. She brings him food and takes care of his medications. There are aides that care for the mother and they look in on him as well. He states that he normally drives and cares for himself. He declines being enrolled in the 30 day program because he has the help he needs and just needs to gain his strength.  Deidre Ala, RN Medical illustrator [facility] [phone]

## 2023-02-17 ENCOUNTER — Other Ambulatory Visit: Payer: Self-pay | Admitting: Internal Medicine

## 2023-02-17 ENCOUNTER — Telehealth: Payer: Self-pay | Admitting: Internal Medicine

## 2023-02-17 DIAGNOSIS — D62 Acute posthemorrhagic anemia: Secondary | ICD-10-CM

## 2023-02-17 NOTE — Telephone Encounter (Signed)
[facility] called and said patient's heart rate today was 115. She also said they continued his care today. They will be seeing him 1 week 1, 2 week 3, and 1 week 4. Her number is [phone]. Her name is Angie.

## 2023-02-18 ENCOUNTER — Other Ambulatory Visit
Admission: RE | Admit: 2023-02-18 | Discharge: 2023-02-18 | Disposition: A | Discharge status: Home / Self Care | Payer: Medicare Other | Source: Ambulatory Visit | Attending: Internal Medicine | Admitting: Internal Medicine

## 2023-02-18 ENCOUNTER — Other Ambulatory Visit: Payer: Self-pay | Admitting: Internal Medicine

## 2023-02-18 DIAGNOSIS — D62 Acute posthemorrhagic anemia: Secondary | ICD-10-CM

## 2023-02-18 LAB — CBC WITH DIFFERENTIAL/PLATELET
Abs Immature Granulocytes: 0.06 10*3/uL (ref 0.00–0.07)
Basophils Absolute: 0.1 10*3/uL (ref 0.0–0.1)
Basophils Relative: 1 %
Eosinophils Absolute: 0.4 10*3/uL (ref 0.0–0.5)
Eosinophils Relative: 6 %
HCT: 25.6 % — ABNORMAL LOW (ref 39.0–52.0)
Hemoglobin: 8.3 g/dL — ABNORMAL LOW (ref 13.0–17.0)
Immature Granulocytes: 1 %
Lymphocytes Relative: 14 %
Lymphs Abs: 1 10*3/uL (ref 0.7–4.0)
MCH: 29.5 pg (ref 26.0–34.0)
MCHC: 32.4 g/dL (ref 30.0–36.0)
MCV: 91.1 fL (ref 80.0–100.0)
Monocytes Absolute: 0.5 10*3/uL (ref 0.1–1.0)
Monocytes Relative: 7 %
Neutro Abs: 5.4 10*3/uL (ref 1.7–7.7)
Neutrophils Relative %: 71 %
Platelets: 294 10*3/uL (ref 150–400)
RBC: 2.81 MIL/uL — ABNORMAL LOW (ref 4.22–5.81)
RDW: 15.8 % — ABNORMAL HIGH (ref 11.5–15.5)
WBC: 7.5 10*3/uL (ref 4.0–10.5)
nRBC: 0 % (ref 0.0–0.2)

## 2023-02-18 LAB — BASIC METABOLIC PANEL
Anion gap: 9 (ref 5–15)
BUN: 26 mg/dL — ABNORMAL HIGH (ref 8–23)
CO2: 26 mmol/L (ref 22–32)
Calcium: 8.5 mg/dL — ABNORMAL LOW (ref 8.9–10.3)
Chloride: 103 mmol/L (ref 98–111)
Creatinine, Ser: 0.65 mg/dL (ref 0.61–1.24)
GFR, Estimated: >60 mL/min (ref >60)
Glucose, Bld: 94 mg/dL (ref 70–99)
Potassium: 4.2 mmol/L (ref 3.5–5.1)
Sodium: 138 mmol/L (ref 135–145)

## 2023-02-18 LAB — IRON AND TIBC
Iron: 40 ug/dL — ABNORMAL LOW (ref 45–182)
Saturation Ratios: 14 % — ABNORMAL LOW (ref 17.9–39.5)
TIBC: 293 ug/dL (ref 250–450)
UIBC: 253 ug/dL

## 2023-02-18 MED ORDER — IRON (FERROUS SULFATE) 325 (65 FE) MG PO TABS: ORAL_TABLET | ORAL | 2 refills | Status: DC

## 2023-02-18 NOTE — Telephone Encounter (Signed)
Complete Vital signs 96.5 temp 69 pulse 16 R 90/52 BP 92 o2 sat  Copy of discharge

## 2023-02-18 NOTE — Assessment & Plan Note (Signed)
Secondary to multiple gastric ulcers while taking eliquis.  He received 4 units of PRBCs at [facility] prior to transfer to [facility],  followed by at least one utin at [facility]. Hgb is unchanged from [facility] discharge hgb on 10/22  but  Iron stores are low.  Will supplement  Lab Results  Component Value Date   WBC 7.5 02/18/2023   HGB 8.3 (L) 02/18/2023   HCT 25.6 (L) 02/18/2023   MCV 91.1 02/18/2023   PLT 294 02/18/2023   Lab Results  Component Value Date   IRON 40 (L) 02/18/2023   TIBC 293 02/18/2023

## 2023-02-18 NOTE — Telephone Encounter (Signed)
Called Angie back at [facility] to let her know Dr. Darrick Huntsman  does  not have a discharge summary (complete) from patient's recent [facility] hospitalization. Dr. Darrick Huntsman does not have a current medication list either.  Dr. Darrick Huntsman cannot react to a report of pulse of 115 without a complete set of vital signs .   please tell the nurse to call the patient's cardiologist at [facility] if they are concerned about his heart rate,  or call the nurse from [facility] back and ask them for a complete set of vital signs and a copy of his discharge summary.

## 2023-02-21 DIAGNOSIS — I110 Hypertensive heart disease with heart failure: Secondary | ICD-10-CM

## 2023-02-21 DIAGNOSIS — M4722 Other spondylosis with radiculopathy, cervical region: Secondary | ICD-10-CM

## 2023-02-21 DIAGNOSIS — M2141 Flat foot [pes planus] (acquired), right foot: Secondary | ICD-10-CM

## 2023-02-21 DIAGNOSIS — M4186 Other forms of scoliosis, lumbar region: Secondary | ICD-10-CM

## 2023-02-21 DIAGNOSIS — I951 Orthostatic hypotension: Secondary | ICD-10-CM

## 2023-02-21 DIAGNOSIS — G4733 Obstructive sleep apnea (adult) (pediatric): Secondary | ICD-10-CM

## 2023-02-21 DIAGNOSIS — I480 Paroxysmal atrial fibrillation: Secondary | ICD-10-CM

## 2023-02-21 DIAGNOSIS — E559 Vitamin D deficiency, unspecified: Secondary | ICD-10-CM

## 2023-02-21 DIAGNOSIS — Z48812 Encounter for surgical aftercare following surgery on the circulatory system: Secondary | ICD-10-CM

## 2023-02-21 DIAGNOSIS — Z6841 Body Mass Index (BMI) 40.0 and over, adult: Secondary | ICD-10-CM

## 2023-02-21 DIAGNOSIS — I5020 Unspecified systolic (congestive) heart failure: Secondary | ICD-10-CM

## 2023-03-02 ENCOUNTER — Telehealth: Payer: Self-pay

## 2023-03-02 NOTE — Transitions of Care (Post Inpatient/ED Visit) (Signed)
   03/02/2023  Name: John Wolf MRN: [id number] DOB: Oct 14, 1955  Today's TOC FU Call Status: Today's TOC FU Call Status:: Unsuccessful Call (1st Attempt) Unsuccessful Call (1st Attempt) Date: 03/02/23  Attempted to reach the patient regarding the most recent Inpatient/ED visit.  Follow Up Plan: Additional outreach attempts will be made to reach the patient to complete the Transitions of Care (Post Inpatient/ED visit) call.   Deidre Ala, RN Medical illustrator [facility] [phone]

## 2023-03-03 ENCOUNTER — Telehealth: Payer: Self-pay

## 2023-03-03 ENCOUNTER — Telehealth: Payer: Self-pay | Admitting: Internal Medicine

## 2023-03-03 NOTE — Telephone Encounter (Signed)
Pam from [facility] called stating the pt just got back home from the [facility] hospital because he was bleeding. Pam stated [facility] had to carterized him because of the bleeding and [facility] said to follow -up with his doctor to get lab results done. Pam stated the family want to do the blood work at home for the cbc, B12 and iron studies. Pam stated the provider need to let home health know what iron studies to do and how often

## 2023-03-03 NOTE — Transitions of Care (Post Inpatient/ED Visit) (Signed)
03/03/2023  Name: John Wolf MRN: [id number] DOB: 1955-05-01  Today's TOC FU Call Status: Today's TOC FU Call Status:: Successful TOC FU Call Completed TOC FU Call Complete Date: 03/03/23 Patient's Name and Date of Birth confirmed.  Transition Care Management Follow-up Telephone Call Date of Discharge: 03/01/23 Discharge Facility[facility] Other ([facility]) [facility]: [facility] Type of Discharge: Inpatient Admission Primary Inpatient Discharge Diagnosis:: GI Bleed How have you been since you were released from the hospital?: Better Any questions or concerns?: No  Items Reviewed: Did you receive and understand the discharge instructions provided?: Yes Medications obtained,verified, and reconciled?: Yes (Medications Reviewed) Any new allergies since your discharge?: No Dietary orders reviewed?: NA Do you have support at home?: Yes People in Home: significant other Name of Support/Comfort Primary Source: Hyman Bower  Medications Reviewed Today: Medications Reviewed Today     Reviewed by Redge Gainer, RN (Case Manager) on 03/03/23 at 1400  Med List Status: <None>   Medication Order Taking? Sig Documenting Provider Last Dose Status Informant  ALPRAZolam (XANAX) 0.25 MG tablet [id number]  Take 1 tablet (0.25 mg total) by mouth at bedtime as needed for anxiety. Or insomnia Sherlene Shams, MD  Active   amiodarone (PACERONE) 200 MG tablet [id number] No Take 200 mg by mouth daily. Taking 2 tablets daily [provider] Taking Active   apixaban (ELIQUIS) 5 MG TABS tablet [id number] No Take 5 mg by mouth 2 (two) times daily. [provider] Taking Active   aspirin 81 MG chewable tablet [id number] No Chew 81 mg by mouth daily. [provider] Taking Active   atorvastatin (LIPITOR) 80 MG tablet [id number] No Take 80 mg by mouth daily. [provider] Taking Active   furosemide (LASIX) 40 MG tablet [id number]  No Take 40 mg by mouth daily. [provider] Taking Active   Iron, Ferrous Sulfate, 325 (65 Fe) MG TABS [id number]  One tablet daily after a meal Sherlene Shams, MD  Active   metFORMIN (GLUCOPHAGE-XR) 500 MG 24 hr tablet [id number] No Take 500 mg by mouth daily with breakfast.  Patient not taking: Reported on 02/11/2023   [provider] Not Taking Active            Med Note Kate Sable, JESSICA   Fri Feb 11, 2023  5:06 PM) On hold  metoprolol tartrate (LOPRESSOR) 25 MG tablet [id number] No Take 25 mg by mouth 2 (two) times daily. [provider] Taking Active   mirtazapine (REMERON) 15 MG tablet [id number]  1/2 to 1 tablet 30 minutes before bedtime daily Sherlene Shams, MD  Active   spironolactone (ALDACTONE) 25 MG tablet [id number] No Take 1 tablet by mouth daily. [provider] Taking Active   tamsulosin (FLOMAX) 0.4 MG CAPS capsule [id number] No Take 0.4 mg by mouth daily.  Patient not taking: Reported on 02/11/2023   [provider] Not Taking Active            Med Note Kate Sable, JESSICA   Fri Feb 11, 2023  5:06 PM) On hold            Home Care and Equipment/Supplies: Were Home Health Services Ordered?: Yes Name of Home Health Agency:: [facility] Has Agency set up a time to come to your home?: Yes First Home Health Visit Date: 03/03/23 Any new equipment or medical supplies ordered?: NA  Functional Questionnaire: Do you need assistance with bathing/showering or dressing?: No Do you need assistance with meal preparation?:  Yes Do you need assistance with eating?: No Do you have difficulty maintaining continence: No Do you need assistance with getting out of bed/getting out of a chair/moving?: No Do you have difficulty managing or taking your medications?: No  Follow up appointments reviewed: PCP Follow-up appointment confirmed?: No MD Provider Line Number:[phone] Given: No Specialist Hospital Follow-up appointment confirmed?:  NA Do you need transportation to your follow-up appointment?: No Do you understand care options if your condition(s) worsen?: Yes-patient verbalized understanding  SDOH Interventions Today    Flowsheet Row Most Recent Value  SDOH Interventions   Food Insecurity Interventions Intervention Not Indicated  Transportation Interventions Intervention Not Indicated      Contacted the patient who states he had a GI bleed and was in the hospital a few days. He states nothing has changed from his prior TOC review call about 10 days ago. His Home Health has been resumed.   Deidre Ala, RN Medical illustrator [facility] [phone]

## 2023-03-04 NOTE — Telephone Encounter (Signed)
LMTCB with Pam

## 2023-03-04 NOTE — Telephone Encounter (Signed)
Pam with home health would like to know if it is okay to draw at his next visit next week?

## 2023-03-07 NOTE — Telephone Encounter (Signed)
noted 

## 2023-03-07 NOTE — Telephone Encounter (Signed)
John Wolf with home health just called and she said she can draw John Wolf today or tomorrow. She said next time you call her. You can leave a detailed message on her voicemail if she doesn't pick up.

## 2023-03-07 NOTE — Telephone Encounter (Signed)
LMTCB

## 2023-03-10 ENCOUNTER — Telehealth: Payer: Self-pay

## 2023-03-10 NOTE — Telephone Encounter (Signed)
Spoke with John Wolf and she asked if we have received the results of the CBC and other labs that were drawn on Monday. I told her that I would check and we would call her with the results. Do not see results in the chart nor have we received them through fax. I reached out to [facility], home health nurse, and she stated that they have not received them either. She stated that she was going to reach out to her manager and give [place] a call back or have them faxed to [place].

## 2023-03-11 ENCOUNTER — Other Ambulatory Visit
Admission: RE | Admit: 2023-03-11 | Discharge: 2023-03-11 | Disposition: A | Discharge status: Home / Self Care | Payer: Medicare Other | Attending: Internal Medicine | Admitting: Internal Medicine

## 2023-03-11 ENCOUNTER — Telehealth: Payer: Self-pay

## 2023-03-11 ENCOUNTER — Other Ambulatory Visit: Payer: Self-pay | Admitting: Internal Medicine

## 2023-03-11 DIAGNOSIS — D500 Iron deficiency anemia secondary to blood loss (chronic): Secondary | ICD-10-CM | POA: Insufficient documentation

## 2023-03-11 DIAGNOSIS — I9581 Postprocedural hypotension: Secondary | ICD-10-CM

## 2023-03-11 DIAGNOSIS — D62 Acute posthemorrhagic anemia: Secondary | ICD-10-CM

## 2023-03-11 LAB — CBC WITH DIFFERENTIAL/PLATELET
Abs Immature Granulocytes: 0.03 10*3/uL (ref 0.00–0.07)
Basophils Absolute: 0.1 10*3/uL (ref 0.0–0.1)
Basophils Relative: 1 %
Eosinophils Absolute: 0.3 10*3/uL (ref 0.0–0.5)
Eosinophils Relative: 5 %
HCT: 29.9 % — ABNORMAL LOW (ref 39.0–52.0)
Hemoglobin: 9.1 g/dL — ABNORMAL LOW (ref 13.0–17.0)
Immature Granulocytes: 1 %
Lymphocytes Relative: 18 %
Lymphs Abs: 1 10*3/uL (ref 0.7–4.0)
MCH: 27.9 pg (ref 26.0–34.0)
MCHC: 30.4 g/dL (ref 30.0–36.0)
MCV: 91.7 fL (ref 80.0–100.0)
Monocytes Absolute: 0.5 10*3/uL (ref 0.1–1.0)
Monocytes Relative: 10 %
Neutro Abs: 3.6 10*3/uL (ref 1.7–7.7)
Neutrophils Relative %: 65 %
Platelets: 228 10*3/uL (ref 150–400)
RBC: 3.26 MIL/uL — ABNORMAL LOW (ref 4.22–5.81)
RDW: 16.7 % — ABNORMAL HIGH (ref 11.5–15.5)
WBC: 5.6 10*3/uL (ref 4.0–10.5)
nRBC: 0 % (ref 0.0–0.2)

## 2023-03-11 LAB — BASIC METABOLIC PANEL
Anion gap: 8 (ref 5–15)
BUN: 22 mg/dL (ref 8–23)
CO2: 26 mmol/L (ref 22–32)
Calcium: 8.5 mg/dL — ABNORMAL LOW (ref 8.9–10.3)
Chloride: 103 mmol/L (ref 98–111)
Creatinine, Ser: 0.85 mg/dL (ref 0.61–1.24)
GFR, Estimated: >60 mL/min (ref >60)
Glucose, Bld: 103 mg/dL — ABNORMAL HIGH (ref 70–99)
Potassium: 4 mmol/L (ref 3.5–5.1)
Sodium: 137 mmol/L (ref 135–145)

## 2023-03-11 NOTE — Telephone Encounter (Signed)
Called [facility] to inquire about getting a copy of 03-07-23 labs to be sent to [place] and I spoke to Shanda Bumps this am who stated she would fax over the labs, after not receiving the labs I called [facility] back and spoke with Tiffany who was able to tel me that they do not have the reports but they received a message stating the CBC couldn't be run but that they would get a report on the Iron , B12 folate and ferritin.    Tiffany stated she would call the lab and ask where the results  are.

## 2023-03-11 NOTE — Telephone Encounter (Signed)
See phone note on 03-11-23 tried to call [facility] still have not receive fax for labs

## 2023-03-14 NOTE — Telephone Encounter (Signed)
Looks like lab results have been scanned to pt's chart now.

## 2023-03-15 ENCOUNTER — Inpatient Hospital Stay: Payer: Medicare Other

## 2023-03-15 ENCOUNTER — Inpatient Hospital Stay: Payer: Medicare Other | Attending: Oncology | Admitting: Oncology

## 2023-03-15 ENCOUNTER — Encounter: Payer: Self-pay | Admitting: Oncology

## 2023-03-15 VITALS — BP 106/84 | HR 108 | Temp 96.9°F | Resp 16 | Ht 67.0 in | Wt 263.6 lb

## 2023-03-15 DIAGNOSIS — Z806 Family history of leukemia: Secondary | ICD-10-CM | POA: Insufficient documentation

## 2023-03-15 DIAGNOSIS — D500 Iron deficiency anemia secondary to blood loss (chronic): Secondary | ICD-10-CM | POA: Insufficient documentation

## 2023-03-15 DIAGNOSIS — K922 Gastrointestinal hemorrhage, unspecified: Secondary | ICD-10-CM | POA: Insufficient documentation

## 2023-03-15 NOTE — Progress Notes (Signed)
[facility]  Telephone:([phone]) [phone] Fax:([phone]) [phone]  ID: Gayla Medicus OB: [birth year]  MR#: [id number]  YHC#:[id number]  Patient Care Team: Sherlene Shams, MD as PCP - General (Internal Medicine) Glendale Chard, DO as Consulting Physician (Neurology)  CHIEF COMPLAINT: Iron deficiency anemia.  INTERVAL HISTORY: Patient is a 67 year old male who recently had an extended hospital stay that included heart surgery as well as significant GI bleed where his hemoglobin decreased to 4.0.  He received 5 units of packed red blood cells with improvement of his hemoglobin.  Patient continues to have weakness and fatigue, but it feels improved since his hospitalization.  He has no neurologic complaints.  He denies any recent fevers.  He has a fair appetite, but denies weight loss.  He has no chest pain, shortness of breath, cough, or hemoptysis.  He denies any nausea, vomiting, constipation, or diarrhea.  He has no melena or hematochezia.  He has no urinary complaints.  Patient offers no further specific complaints today.  REVIEW OF SYSTEMS:   Review of Systems  Constitutional:  Positive for malaise/fatigue. Negative for fever and weight loss.  Respiratory: Negative.  Negative for cough, hemoptysis and shortness of breath.   Cardiovascular: Negative.  Negative for chest pain and leg swelling.  Gastrointestinal: Negative.  Negative for abdominal pain, blood in stool and melena.  Genitourinary: Negative.  Negative for hematuria.  Musculoskeletal: Negative.  Negative for back pain.  Skin: Negative.  Negative for rash.  Neurological:  Positive for weakness. Negative for dizziness, focal weakness and headaches.  Psychiatric/Behavioral: Negative.  The patient is not nervous/anxious.     As per HPI. Otherwise, a complete review of systems is negative.  PAST MEDICAL HISTORY: Past Medical History:  Diagnosis Date   Acquired pes planus    Arthritis    Benign neoplasm of colon     History of left knee replacement    Hyperlipidemia    Hypertension    Obesity (BMI 30-39.9)    with 70 lb weight loss, ongoing   Onychia and paronychia of toe    Sleep apnea, obstructive 04/26/2000   wears CPAP    PAST SURGICAL HISTORY: Past Surgical History:  Procedure Laterality Date   athroplasty left shoulder     COLONOSCOPY WITH PROPOFOL N/A 08/27/2021   Procedure: COLONOSCOPY WITH PROPOFOL;  Surgeon: Midge Minium, MD;  Location: [facility];  Service: Endoscopy;  Laterality: N/A;   left shoulder     shoulder dislocation, rotator    POLYPECTOMY N/A 08/27/2021   Procedure: POLYPECTOMY;  Surgeon: Midge Minium, MD;  Location: [facility];  Service: Endoscopy;  Laterality: N/A;    FAMILY HISTORY: Family History  Problem Relation Age of Onset   Heart disease Mother    Stroke Father    Stroke Brother    Hypertension Brother    Cancer Brother 12       AML    ADVANCED DIRECTIVES (Y/N):  N  HEALTH MAINTENANCE: Social History   Tobacco Use   Smoking status: Never   Smokeless tobacco: Never  Vaping Use   Vaping status: Never Used  Substance Use Topics   Alcohol use: Yes    Comment: occasionally   Drug use: Never     Colonoscopy:  PAP:  Bone density:  Lipid panel:  Allergies  Allergen Reactions   Quinolones Other (See Comments)    Fluoroquinolone antibiotics should be avoided in patients with history of aortic aneurysm if alternative therapy is available  ([email or web address])  Current Outpatient Medications  Medication Sig Dispense Refill   amiodarone (PACERONE) 200 MG tablet Take 200 mg by mouth daily. Taking 2 tablets daily     apixaban (ELIQUIS) 5 MG TABS tablet Take 5 mg by mouth 2 (two) times daily.     atorvastatin (LIPITOR) 80 MG tablet Take 80 mg by mouth daily.     furosemide (LASIX) 40 MG tablet Take 40 mg by mouth daily.     Melatonin 1 MG CAPS Take by mouth.     metoprolol tartrate  (LOPRESSOR) 25 MG tablet Take 25 mg by mouth 2 (two) times daily.     mirtazapine (REMERON) 15 MG tablet 1/2 to 1 tablet 30 minutes before bedtime daily 30 tablet 2   spironolactone (ALDACTONE) 25 MG tablet Take 1 tablet by mouth daily.     ALPRAZolam (XANAX) 0.25 MG tablet Take 1 tablet (0.25 mg total) by mouth at bedtime as needed for anxiety. Or insomnia (Patient not taking: Reported on 03/15/2023) 30 tablet 0   aspirin 81 MG chewable tablet Chew 81 mg by mouth daily. (Patient not taking: Reported on 03/15/2023)     Iron, Ferrous Sulfate, 325 (65 Fe) MG TABS One tablet daily after a meal (Patient not taking: Reported on 03/15/2023) 30 tablet 2   metFORMIN (GLUCOPHAGE-XR) 500 MG 24 hr tablet Take 500 mg by mouth daily with breakfast. (Patient not taking: Reported on 02/11/2023)     tamsulosin (FLOMAX) 0.4 MG CAPS capsule Take 0.4 mg by mouth daily. (Patient not taking: Reported on 02/11/2023)     No current facility-administered medications for this visit.    OBJECTIVE: Vitals:   03/15/23 1335  BP: 106/84  Pulse: (!) 108  Resp: 16  Temp: (!) 96.9 F (36.1 C)  SpO2: 100%     Body mass index is 41.29 kg/m.    ECOG FS:0 - Asymptomatic  General: Well-developed, well-nourished, no acute distress. Eyes: Pink conjunctiva, anicteric sclera. HEENT: Normocephalic, moist mucous membranes. Lungs: No audible wheezing or coughing. Heart: Regular rate and rhythm. Abdomen: Soft, nontender, no obvious distention. Musculoskeletal: No edema, cyanosis, or clubbing. Neuro: Alert, answering all questions appropriately. Cranial nerves grossly intact. Skin: No rashes or petechiae noted. Psych: Normal affect. Lymphatics: No cervical, calvicular, axillary or inguinal LAD.   LAB RESULTS:  Lab Results  Component Value Date   NA 137 03/11/2023   K 4.0 03/11/2023   CL 103 03/11/2023   CO2 26 03/11/2023   GLUCOSE 103 (H) 03/11/2023   BUN 22 03/11/2023   CREATININE 0.85 03/11/2023   CALCIUM 8.5 (L)  03/11/2023   PROT 5.2 (L) 02/12/2023   ALBUMIN 2.5 (L) 02/12/2023   AST 27 02/12/2023   ALT 32 02/12/2023   ALKPHOS 47 02/12/2023   BILITOT 0.2 (L) 02/12/2023   GFRNONAA >60 03/11/2023   GFRAA >60 05/25/2019    Lab Results  Component Value Date   WBC 5.6 03/11/2023   NEUTROABS 3.6 03/11/2023   HGB 9.1 (L) 03/11/2023   HCT 29.9 (L) 03/11/2023   MCV 91.7 03/11/2023   PLT 228 03/11/2023   Lab Results  Component Value Date   IRON 40 (L) 02/18/2023   TIBC 293 02/18/2023   IRONPCTSAT 14 (L) 02/18/2023   No results found for: "FERRITIN"   STUDIES: No results found.  ASSESSMENT: Iron deficiency anemia.  PLAN:    Iron deficiency anemia: Secondary to GI bleed.  See admission note dated February 22, 2023 to [facility] in care everywhere for details of his hospital  stay.  Patient underwent EGD on February 13, 2023 and was found to have 3 clean-based duodenal ulcers.  Colonoscopy was reported as negative.  His hospital course was also complicated by postoperative mediastinal hematoma.  He received 5 units of packed red blood cells while in the hospital.  Patient's hemoglobin is improved, but still decreased.  He also has reduced iron stores.  He will benefit from IV iron.  Return to clinic 5 times over the next 2 to 3 weeks to receive 200 mg IV Venofer.  Patient will then return to clinic in 4 months with repeat laboratory work, further evaluation, and consideration of additional IV Venofer if necessary. GI bleed: Continue pantoprazole.  Follow-up with GI as scheduled.  I spent a total of 45 minutes reviewing chart data, face-to-face evaluation with the patient, counseling and coordination of care as detailed above.   Patient expressed understanding and was in agreement with this plan. He also understands that He can call clinic at any time with any questions, concerns, or complaints.     Jeralyn Ruths, MD   03/15/2023 2:57 PM

## 2023-03-17 ENCOUNTER — Inpatient Hospital Stay: Payer: Medicare Other

## 2023-03-17 ENCOUNTER — Telehealth: Payer: Self-pay

## 2023-03-17 VITALS — BP 102/72 | HR 72 | Resp 16

## 2023-03-17 DIAGNOSIS — D500 Iron deficiency anemia secondary to blood loss (chronic): Secondary | ICD-10-CM

## 2023-03-17 MED ORDER — IRON SUCROSE 20 MG/ML IV SOLN
200.0000 mg | Freq: Once | INTRAVENOUS | Status: AC
  Administered 2023-03-17: 200 mg via INTRAVENOUS
  Filled 2023-03-17: qty 10

## 2023-03-17 NOTE — Telephone Encounter (Signed)
3 different home health orders have been placed in provider to be signed folder

## 2023-03-29 ENCOUNTER — Inpatient Hospital Stay: Payer: Medicare Other | Attending: Oncology

## 2023-03-29 ENCOUNTER — Inpatient Hospital Stay: Payer: Medicare Other

## 2023-03-29 VITALS — BP 108/79 | HR 79 | Temp 99.1°F | Resp 18

## 2023-03-29 DIAGNOSIS — D500 Iron deficiency anemia secondary to blood loss (chronic): Secondary | ICD-10-CM | POA: Diagnosis present

## 2023-03-29 DIAGNOSIS — K922 Gastrointestinal hemorrhage, unspecified: Secondary | ICD-10-CM | POA: Insufficient documentation

## 2023-03-29 MED ORDER — IRON SUCROSE 20 MG/ML IV SOLN
200.0000 mg | Freq: Once | INTRAVENOUS | Status: AC
  Administered 2023-03-29: 200 mg via INTRAVENOUS

## 2023-03-29 NOTE — Patient Instructions (Signed)
Iron Sucrose Injection What is this medication? IRON SUCROSE (EYE ern SOO krose) treats low levels of iron (iron deficiency anemia) in people with kidney disease. Iron is a mineral that plays an important role in making red blood cells, which carry oxygen from your lungs to the rest of your body. This medicine may be used for other purposes; ask your health care provider or pharmacist if you have questions. COMMON BRAND NAME(S): Venofer What should I tell my care team before I take this medication? They need to know if you have any of these conditions: Anemia not caused by low iron levels Heart disease High levels of iron in the blood Kidney disease Liver disease An unusual or allergic reaction to iron, other medications, foods, dyes, or preservatives Pregnant or trying to get pregnant Breastfeeding How should I use this medication? This medication is for infusion into a vein. It is given in a hospital or clinic setting. Talk to your care team about the use of this medication in children. While this medication may be prescribed for children as young as 2 years for selected conditions, precautions do apply. Overdosage: If you think you have taken too much of this medicine contact a poison control center or emergency room at once. NOTE: This medicine is only for you. Do not share this medicine with others. What if I miss a dose? Keep appointments for follow-up doses. It is important not to miss your dose. Call your care team if you are unable to keep an appointment. What may interact with this medication? Do not take this medication with any of the following: Deferoxamine Dimercaprol Other iron products This medication may also interact with the following: Chloramphenicol Deferasirox This list may not describe all possible interactions. Give your health care provider a list of all the medicines, herbs, non-prescription drugs, or dietary supplements you use. Also tell them if you smoke,  drink alcohol, or use illegal drugs. Some items may interact with your medicine. What should I watch for while using this medication? Visit your care team regularly. Tell your care team if your symptoms do not start to get better or if they get worse. You may need blood work done while you are taking this medication. You may need to follow a special diet. Talk to your care team. Foods that contain iron include: whole grains/cereals, dried fruits, beans, or peas, leafy green vegetables, and organ meats (liver, kidney). What side effects may I notice from receiving this medication? Side effects that you should report to your care team as soon as possible: Allergic reactions--skin rash, itching, hives, swelling of the face, lips, tongue, or throat Low blood pressure--dizziness, feeling faint or lightheaded, blurry vision Shortness of breath Side effects that usually do not require medical attention (report to your care team if they continue or are bothersome): Flushing Headache Joint pain Muscle pain Nausea Pain, redness, or irritation at injection site This list may not describe all possible side effects. Call your doctor for medical advice about side effects. You may report side effects to FDA at 1-800-FDA-1088. Where should I keep my medication? This medication is given in a hospital or clinic. It will not be stored at home. NOTE: This sheet is a summary. It may not cover all possible information. If you have questions about this medicine, talk to your doctor, pharmacist, or health care provider.  2024 [employer]vier/Gold Standard (2022-09-17 00:00:00)

## 2023-04-01 ENCOUNTER — Inpatient Hospital Stay: Payer: Medicare Other

## 2023-04-01 VITALS — BP 112/76 | HR 60 | Resp 18

## 2023-04-01 DIAGNOSIS — D500 Iron deficiency anemia secondary to blood loss (chronic): Secondary | ICD-10-CM

## 2023-04-01 MED ORDER — IRON SUCROSE 20 MG/ML IV SOLN
200.0000 mg | Freq: Once | INTRAVENOUS | Status: AC
  Administered 2023-04-01: 200 mg via INTRAVENOUS
  Filled 2023-04-01: qty 10

## 2023-04-01 MED ORDER — SODIUM CHLORIDE 0.9% FLUSH
10.0000 mL | Freq: Once | INTRAVENOUS | Status: AC | PRN
  Administered 2023-04-01: 10 mL
  Filled 2023-04-01: qty 10

## 2023-04-04 ENCOUNTER — Inpatient Hospital Stay: Payer: Medicare Other

## 2023-04-04 VITALS — BP 114/77 | HR 68 | Temp 96.4°F | Resp 18

## 2023-04-04 DIAGNOSIS — D500 Iron deficiency anemia secondary to blood loss (chronic): Secondary | ICD-10-CM | POA: Diagnosis not present

## 2023-04-04 MED ORDER — IRON SUCROSE 20 MG/ML IV SOLN
200.0000 mg | Freq: Once | INTRAVENOUS | Status: AC
  Administered 2023-04-04: 200 mg via INTRAVENOUS
  Filled 2023-04-04: qty 10

## 2023-04-04 MED ORDER — SODIUM CHLORIDE 0.9% FLUSH
10.0000 mL | Freq: Once | INTRAVENOUS | Status: AC | PRN
  Administered 2023-04-04: 10 mL
  Filled 2023-04-04: qty 10

## 2023-04-05 ENCOUNTER — Telehealth: Payer: Self-pay | Admitting: Internal Medicine

## 2023-04-05 MED ORDER — FUROSEMIDE 40 MG PO TABS: 40.0000 mg | ORAL_TABLET | Freq: Every day | ORAL | 1 refills | Status: DC

## 2023-04-05 NOTE — Addendum Note (Signed)
Addended by: Sherlene Shams on: 04/05/2023 01:29 PM   Modules accepted: Orders

## 2023-04-05 NOTE — Telephone Encounter (Signed)
Furosemide refilled. Continue current dosing. Patient needs appt for hospital follow up

## 2023-04-05 NOTE — Telephone Encounter (Signed)
[facility] called states patient weight has been trending up 1 to 2lbs a over the last 10 days.  She also reports patient is out of furosemide (LASIX) 40 MG tablet   Vitals are Temp 98.0 Pulse 98 R 17 BP 132/98 Oxy 98%  Weight 258  Please call Angie at [phone]

## 2023-04-06 NOTE — Telephone Encounter (Signed)
Spoke with pt and he stated he has already picked up the lasix. Pt has been scheduled for a hospital follow up for next week.

## 2023-04-07 ENCOUNTER — Inpatient Hospital Stay: Payer: Medicare Other

## 2023-04-07 VITALS — BP 113/79 | HR 84 | Temp 97.5°F | Resp 16

## 2023-04-07 DIAGNOSIS — D500 Iron deficiency anemia secondary to blood loss (chronic): Secondary | ICD-10-CM

## 2023-04-07 MED ORDER — IRON SUCROSE 20 MG/ML IV SOLN
200.0000 mg | Freq: Once | INTRAVENOUS | Status: AC
  Administered 2023-04-07: 200 mg via INTRAVENOUS
  Filled 2023-04-07: qty 10

## 2023-04-07 NOTE — Progress Notes (Signed)
Pt tolerated treatment without complaints.  VSS.  Pt refused 30 minute post observation period.  Pt understands risks.   

## 2023-04-07 NOTE — Patient Instructions (Signed)
Iron Sucrose Injection What is this medication? IRON SUCROSE (EYE ern SOO krose) treats low levels of iron (iron deficiency anemia) in people with kidney disease. Iron is a mineral that plays an important role in making red blood cells, which carry oxygen from your lungs to the rest of your body. This medicine may be used for other purposes; ask your health care provider or pharmacist if you have questions. COMMON BRAND NAME(S): Venofer What should I tell my care team before I take this medication? They need to know if you have any of these conditions: Anemia not caused by low iron levels Heart disease High levels of iron in the blood Kidney disease Liver disease An unusual or allergic reaction to iron, other medications, foods, dyes, or preservatives Pregnant or trying to get pregnant Breastfeeding How should I use this medication? This medication is for infusion into a vein. It is given in a hospital or clinic setting. Talk to your care team about the use of this medication in children. While this medication may be prescribed for children as young as 2 years for selected conditions, precautions do apply. Overdosage: If you think you have taken too much of this medicine contact a poison control center or emergency room at once. NOTE: This medicine is only for you. Do not share this medicine with others. What if I miss a dose? Keep appointments for follow-up doses. It is important not to miss your dose. Call your care team if you are unable to keep an appointment. What may interact with this medication? Do not take this medication with any of the following: Deferoxamine Dimercaprol Other iron products This medication may also interact with the following: Chloramphenicol Deferasirox This list may not describe all possible interactions. Give your health care provider a list of all the medicines, herbs, non-prescription drugs, or dietary supplements you use. Also tell them if you smoke,  drink alcohol, or use illegal drugs. Some items may interact with your medicine. What should I watch for while using this medication? Visit your care team regularly. Tell your care team if your symptoms do not start to get better or if they get worse. You may need blood work done while you are taking this medication. You may need to follow a special diet. Talk to your care team. Foods that contain iron include: whole grains/cereals, dried fruits, beans, or peas, leafy green vegetables, and organ meats (liver, kidney). What side effects may I notice from receiving this medication? Side effects that you should report to your care team as soon as possible: Allergic reactions--skin rash, itching, hives, swelling of the face, lips, tongue, or throat Low blood pressure--dizziness, feeling faint or lightheaded, blurry vision Shortness of breath Side effects that usually do not require medical attention (report to your care team if they continue or are bothersome): Flushing Headache Joint pain Muscle pain Nausea Pain, redness, or irritation at injection site This list may not describe all possible side effects. Call your doctor for medical advice about side effects. You may report side effects to FDA at 1-800-FDA-1088. Where should I keep my medication? This medication is given in a hospital or clinic. It will not be stored at home. NOTE: This sheet is a summary. It may not cover all possible information. If you have questions about this medicine, talk to your doctor, pharmacist, or health care provider.  2024 [employer]vier/Gold Standard (2022-09-17 00:00:00)

## 2023-04-08 ENCOUNTER — Inpatient Hospital Stay: Payer: Medicare Other

## 2023-04-11 ENCOUNTER — Other Ambulatory Visit: Payer: Self-pay

## 2023-04-11 ENCOUNTER — Inpatient Hospital Stay: Payer: Medicare Other

## 2023-04-11 ENCOUNTER — Encounter: Payer: Medicare Other | Attending: Family Medicine

## 2023-04-11 DIAGNOSIS — Z9889 Other specified postprocedural states: Secondary | ICD-10-CM | POA: Insufficient documentation

## 2023-04-11 DIAGNOSIS — Z48812 Encounter for surgical aftercare following surgery on the circulatory system: Secondary | ICD-10-CM | POA: Insufficient documentation

## 2023-04-11 DIAGNOSIS — Z952 Presence of prosthetic heart valve: Secondary | ICD-10-CM | POA: Insufficient documentation

## 2023-04-11 NOTE — Progress Notes (Signed)
Virtual Visit completed. Patient informed on EP and RD appointment and 6 Minute walk test. Patient also informed of patient health questionnaires on My Chart. Patient Verbalizes understanding. Visit diagnosis can be found in [facility] 02/01/2023.

## 2023-04-14 ENCOUNTER — Ambulatory Visit (INDEPENDENT_AMBULATORY_CARE_PROVIDER_SITE_OTHER): Payer: Medicare Other | Admitting: Internal Medicine

## 2023-04-14 ENCOUNTER — Encounter: Payer: Self-pay | Admitting: Internal Medicine

## 2023-04-14 VITALS — Ht 67.0 in | Wt 260.3 lb

## 2023-04-14 VITALS — BP 106/70 | HR 102 | Ht 67.0 in | Wt 260.4 lb

## 2023-04-14 DIAGNOSIS — M48061 Spinal stenosis, lumbar region without neurogenic claudication: Secondary | ICD-10-CM | POA: Diagnosis not present

## 2023-04-14 DIAGNOSIS — Z6841 Body Mass Index (BMI) 40.0 and over, adult: Secondary | ICD-10-CM

## 2023-04-14 DIAGNOSIS — Z9889 Other specified postprocedural states: Secondary | ICD-10-CM | POA: Diagnosis not present

## 2023-04-14 DIAGNOSIS — I480 Paroxysmal atrial fibrillation: Secondary | ICD-10-CM

## 2023-04-14 DIAGNOSIS — Z952 Presence of prosthetic heart valve: Secondary | ICD-10-CM

## 2023-04-14 DIAGNOSIS — D62 Acute posthemorrhagic anemia: Secondary | ICD-10-CM

## 2023-04-14 DIAGNOSIS — Z48812 Encounter for surgical aftercare following surgery on the circulatory system: Secondary | ICD-10-CM | POA: Diagnosis not present

## 2023-04-14 MED ORDER — TRAZODONE HCL 50 MG PO TABS: 25.0000 mg | ORAL_TABLET | Freq: Every evening | ORAL | 3 refills | Status: AC | PRN

## 2023-04-14 NOTE — Patient Instructions (Addendum)
I am adding trazodone to help you sleep,  start with 1/2 tablet at bedtime  Do not take any more remeron (mirtazapine)  since your appetite is GOOD.   You see your heart doctor at [facility] in early January   We will recheck your hemoglobin at the end of December

## 2023-04-14 NOTE — Patient Instructions (Signed)
Patient Instructions  Patient Details  Name: John Wolf MRN: [id number] Date of Birth: 1955/04/29 Referring Provider:  Vernard Gambles, MD  Below are your personal goals for exercise, nutrition, and risk factors. Our goal is to help you stay on track towards obtaining and maintaining these goals. We will be discussing your progress on these goals with you throughout the program.  Initial Exercise Prescription:  Initial Exercise Prescription - 04/14/23 1100       Date of Initial Exercise RX and Referring Provider   Date 04/14/23    Referring Provider [name], MD      Oxygen   Maintain Oxygen Saturation 88% or higher      Treadmill   MPH 1.2    Grade 0    Minutes 15    METs 1.92      NuStep   Level 1    SPM 80    Minutes 15    METs 1.8      Biostep-RELP   Level 1    SPM 50    Minutes 15    METs 1.8      Track   Laps 17    Minutes 15    METs 1.92      Prescription Details   Frequency (times per week) 2    Duration Progress to 30 minutes of continuous aerobic without signs/symptoms of physical distress      Intensity   THRR 40-80% of Max Heartrate 119-143    Ratings of Perceived Exertion 11-13    Perceived Dyspnea 0-4      Progression   Progression Continue to progress workloads to maintain intensity without signs/symptoms of physical distress.      Resistance Training   Training Prescription Yes    Weight 3 lb    Reps 10-15             Exercise Goals: Frequency: Be able to perform aerobic exercise two to three times per week in program working toward 2-5 days per week of home exercise.  Intensity: Work with a perceived exertion of 11 (fairly light) - 15 (hard) while following your exercise prescription.  We will make changes to your prescription with you as you progress through the program.   Duration: Be able to do 30 to 45 minutes of continuous aerobic exercise in addition to a 5 minute warm-up and a 5 minute cool-down routine.   Nutrition  Goals: Your personal nutrition goals will be established when you do your nutrition analysis with the dietician.  The following are general nutrition guidelines to follow: Cholesterol < 200mg /day Sodium < 1500mg /day Fiber: Men over 50 yrs - 30 grams per day  Personal Goals:  Personal Goals and Risk Factors at Admission - 04/14/23 1132       Core Components/Risk Factors/Patient Goals on Admission    Weight Management Yes;Weight Loss    Intervention Weight Management: Develop a combined nutrition and exercise program designed to reach desired caloric intake, while maintaining appropriate intake of nutrient and fiber, sodium and fats, and appropriate energy expenditure required for the weight goal.;Weight Management: Provide education and appropriate resources to help participant work on and attain dietary goals.;Weight Management/Obesity: Establish reasonable short term and long term weight goals.    Admit Weight 260 lb 4.8 oz (118.1 kg)    Goal Weight: Short Term 255 lb (115.7 kg)    Goal Weight: Long Term 250 lb (113.4 kg)    Expected Outcomes Long Term: Adherence to nutrition and  physical activity/exercise program aimed toward attainment of established weight goal;Short Term: Continue to assess and modify interventions until short term weight is achieved;Weight Loss: Understanding of general recommendations for a balanced deficit meal plan, which promotes 1-2 lb weight loss per week and includes a negative energy balance of [id number] kcal/d;Understanding recommendations for meals to include 15-35% energy as protein, 25-35% energy from fat, 35-60% energy from carbohydrates, less than 200mg  of dietary cholesterol, 20-35 gm of total fiber daily;Understanding of distribution of calorie intake throughout the day with the consumption of 4-5 meals/snacks    Heart Failure Yes    Intervention Provide a combined exercise and nutrition program that is supplemented with education, support and counseling  about heart failure. Directed toward relieving symptoms such as shortness of breath, decreased exercise tolerance, and extremity edema.    Expected Outcomes Improve functional capacity of life;Short term: Attendance in program 2-3 days a week with increased exercise capacity. Reported lower sodium intake. Reported increased fruit and vegetable intake. Reports medication compliance.;Short term: Daily weights obtained and reported for increase. Utilizing diuretic protocols set by physician.    Hypertension Yes    Intervention Provide education on lifestyle modifcations including regular physical activity/exercise, weight management, moderate sodium restriction and increased consumption of fresh fruit, vegetables, and low fat dairy, alcohol moderation, and smoking cessation.;Monitor prescription use compliance.    Expected Outcomes Short Term: Continued assessment and intervention until BP is < 140/97mm HG in hypertensive participants. < 130/7mm HG in hypertensive participants with diabetes, heart failure or chronic kidney disease.;Long Term: Maintenance of blood pressure at goal levels.             Tobacco Use Initial Evaluation: Social History   Tobacco Use  Smoking Status Never  Smokeless Tobacco Never    Exercise Goals and Review:  Exercise Goals     Row Name 04/14/23 1129             Exercise Goals   Increase Physical Activity Yes       Intervention Provide advice, education, support and counseling about physical activity/exercise needs.;Develop an individualized exercise prescription for aerobic and resistive training based on initial evaluation findings, risk stratification, comorbidities and participant's personal goals.       Expected Outcomes Short Term: Attend rehab on a regular basis to increase amount of physical activity.;Long Term: Add in home exercise to make exercise part of routine and to increase amount of physical activity.;Long Term: Exercising regularly at least  3-5 days a week.       Increase Strength and Stamina Yes       Intervention Provide advice, education, support and counseling about physical activity/exercise needs.;Develop an individualized exercise prescription for aerobic and resistive training based on initial evaluation findings, risk stratification, comorbidities and participant's personal goals.       Expected Outcomes Short Term: Increase workloads from initial exercise prescription for resistance, speed, and METs.;Short Term: Perform resistance training exercises routinely during rehab and add in resistance training at home;Long Term: Improve cardiorespiratory fitness, muscular endurance and strength as measured by increased METs and functional capacity ( )       Able to understand and use rate of perceived exertion (RPE) scale Yes       Intervention Provide education and explanation on how to use RPE scale       Expected Outcomes Short Term: Able to use RPE daily in rehab to express subjective intensity level;Long Term:  Able to use RPE to guide intensity level when exercising independently  Able to understand and use Dyspnea scale Yes       Intervention Provide education and explanation on how to use Dyspnea scale       Expected Outcomes Short Term: Able to use Dyspnea scale daily in rehab to express subjective sense of shortness of breath during exertion;Long Term: Able to use Dyspnea scale to guide intensity level when exercising independently       Knowledge and understanding of Target Heart Rate Range (THRR) Yes       Intervention Provide education and explanation of THRR including how the numbers were predicted and where they are located for reference       Expected Outcomes Short Term: Able to state/look up THRR;Short Term: Able to use daily as guideline for intensity in rehab;Long Term: Able to use THRR to govern intensity when exercising independently       Able to check pulse independently Yes       Intervention Provide  education and demonstration on how to check pulse in carotid and radial arteries.;Review the importance of being able to check your own pulse for safety during independent exercise       Expected Outcomes Short Term: Able to explain why pulse checking is important during independent exercise;Long Term: Able to check pulse independently and accurately       Understanding of Exercise Prescription Yes       Intervention Provide education, explanation, and written materials on patient's individual exercise prescription       Expected Outcomes Short Term: Able to explain program exercise prescription;Long Term: Able to explain home exercise prescription to exercise independently

## 2023-04-14 NOTE — Progress Notes (Signed)
Cardiac Individual Treatment Plan  Patient Details  Name: John Wolf MRN: [id number] Date of Birth: 1955/06/06 Referring Provider:   Flowsheet Row Cardiac Rehab from 04/14/2023 in [facility] Cardiac and Pulmonary Rehab  Referring Provider [name], MD       Initial Encounter Date:  Flowsheet Row Cardiac Rehab from 04/14/2023 in [facility] Cardiac and Pulmonary Rehab  Date 04/14/23       Visit Diagnosis: S/P AVR (aortic valve replacement)  Patient's Home Medications on Admission:  Current Outpatient Medications:    ALPRAZolam (XANAX) 0.25 MG tablet, Take 1 tablet (0.25 mg total) by mouth at bedtime as needed for anxiety. Or insomnia, Disp: 30 tablet, Rfl: 0   amiodarone (PACERONE) 200 MG tablet, Take 200 mg by mouth daily. Taking 2 tablets daily, Disp: , Rfl:    apixaban (ELIQUIS) 5 MG TABS tablet, Take 5 mg by mouth 2 (two) times daily., Disp: , Rfl:    atorvastatin (LIPITOR) 80 MG tablet, Take 80 mg by mouth daily., Disp: , Rfl:    furosemide (LASIX) 40 MG tablet, Take 1 tablet (40 mg total) by mouth daily., Disp: 90 tablet, Rfl: 1   Melatonin 1 MG CAPS, Take by mouth., Disp: , Rfl:    metoprolol tartrate (LOPRESSOR) 25 MG tablet, Take 25 mg by mouth 2 (two) times daily., Disp: , Rfl:    mirtazapine (REMERON) 15 MG tablet, 1/2 to 1 tablet 30 minutes before bedtime daily (Patient not taking: Reported on 04/14/2023), Disp: 30 tablet, Rfl: 2   pantoprazole (PROTONIX) 40 MG tablet, Take by mouth., Disp: , Rfl:    spironolactone (ALDACTONE) 25 MG tablet, Take 1 tablet by mouth daily., Disp: , Rfl:   Past Medical History: Past Medical History:  Diagnosis Date   Acquired pes planus    Arthritis    Benign neoplasm of colon    History of left knee replacement    Hyperlipidemia    Hypertension    Obesity (BMI 30-39.9)    with 70 lb weight loss, ongoing   Onychia and paronychia of toe    Sleep apnea, obstructive 04/26/2000   wears CPAP    Tobacco Use: Social History   Tobacco  Use  Smoking Status Never  Smokeless Tobacco Never    Labs: Review Flowsheet  More data exists      Latest Ref Rng & Units 06/16/2018 05/25/2019 08/01/2020 03/10/2021 06/01/2022  Labs for ITP Cardiac and Pulmonary Rehab  Cholestrol <200 mg/dL - - 381  829  937   LDL (calc) mg/dL (calc) - - 169  86  78   Direct LDL mg/dL 678.9  - - - -  HDL-C > OR = 40 mg/dL - - 38.10  17.51  70   Trlycerides <150 mg/dL - - 025.8  52.7  92   Hemoglobin A1c 4.6 - 6.5 % 5.2  5.3  - 4.7  -     Exercise Target Goals: Exercise Program Goal: Individual exercise prescription set using results from initial 6 min walk test and THRR while considering  patient's activity barriers and safety.   Exercise Prescription Goal: Initial exercise prescription builds to 30-45 minutes a day of aerobic activity, 2-3 days per week.  Home exercise guidelines will be given to patient during program as part of exercise prescription that the participant will acknowledge.   Education: Aerobic Exercise: - Group verbal and visual presentation on the components of exercise prescription. Introduces F.I.T.T principle from ACSM for exercise prescriptions.  Reviews F.I.T.T. principles of aerobic exercise  including progression. Written material given at graduation.   Education: Resistance Exercise: - Group verbal and visual presentation on the components of exercise prescription. Introduces F.I.T.T principle from ACSM for exercise prescriptions  Reviews F.I.T.T. principles of resistance exercise including progression. Written material given at graduation.    Education: Exercise & Equipment Safety: - Individual verbal instruction and demonstration of equipment use and safety with use of the equipment. Flowsheet Row Cardiac Rehab from 04/14/2023 in [facility] Cardiac and Pulmonary Rehab  Date 04/14/23  Educator MB  Instruction Review Code 1- Verbalizes Understanding       Education: Exercise Physiology & General Exercise Guidelines: -  Group verbal and written instruction with models to review the exercise physiology of the cardiovascular system and associated critical values. Provides general exercise guidelines with specific guidelines to those with heart or lung disease.    Education: Flexibility, Balance, Mind/Body Relaxation: - Group verbal and visual presentation with interactive activity on the components of exercise prescription. Introduces F.I.T.T principle from ACSM for exercise prescriptions. Reviews F.I.T.T. principles of flexibility and balance exercise training including progression. Also discusses the mind body connection.  Reviews various relaxation techniques to help reduce and manage stress (i.e. Deep breathing, progressive muscle relaxation, and visualization). Balance handout provided to take home. Written material given at graduation.   Activity Barriers & Risk Stratification:  Activity Barriers & Cardiac Risk Stratification - 04/14/23 1124       Activity Barriers & Cardiac Risk Stratification   Activity Barriers Right Knee Replacement;Left Knee Replacement;Other (comment)    Comments R foot reconstruction, L shoulder replacement, lifting restriction    Cardiac Risk Stratification High             6 Minute Walk:  6 Minute Walk     Row Name 04/14/23 1122         6 Minute Walk   Phase Initial     Distance 930 feet     Walk Time 6 minutes     # of Rest Breaks 0     MPH 1.76     METS 1.8     RPE 11     Perceived Dyspnea  1     VO2 Peak 6.31     Symptoms No     Resting HR 96 bpm     Resting BP 100/78     Resting Oxygen Saturation  96 %     Exercise Oxygen Saturation  during 6 min walk 98 %     Max Ex. HR 101 bpm     Max Ex. BP 136/84     2 Minute Post BP 120/86              Oxygen Initial Assessment:   Oxygen Re-Evaluation:   Oxygen Discharge (Final Oxygen Re-Evaluation):   Initial Exercise Prescription:  Initial Exercise Prescription - 04/14/23 1100       Date of  Initial Exercise RX and Referring Provider   Date 04/14/23    Referring Provider [name], MD      Oxygen   Maintain Oxygen Saturation 88% or higher      Treadmill   MPH 1.2    Grade 0    Minutes 15    METs 1.92      NuStep   Level 1    SPM 80    Minutes 15    METs 1.8      Biostep-RELP   Level 1    SPM 50    Minutes 15  METs 1.8      Track   Laps 17    Minutes 15    METs 1.92      Prescription Details   Frequency (times per week) 2    Duration Progress to 30 minutes of continuous aerobic without signs/symptoms of physical distress      Intensity   THRR 40-80% of Max Heartrate 119-143    Ratings of Perceived Exertion 11-13    Perceived Dyspnea 0-4      Progression   Progression Continue to progress workloads to maintain intensity without signs/symptoms of physical distress.      Resistance Training   Training Prescription Yes    Weight 3 lb    Reps 10-15             Perform Capillary Blood Glucose checks as needed.  Exercise Prescription Changes:   Exercise Prescription Changes     Row Name 04/14/23 1100             Response to Exercise   Blood Pressure (Admit) 100/78       Blood Pressure (Exercise) 136/84       Blood Pressure (Exit) 120/86       Heart Rate (Admit) 96 bpm       Heart Rate (Exercise) 101 bpm       Heart Rate (Exit) 100 bpm       Oxygen Saturation (Admit) 96 %       Oxygen Saturation (Exercise) 98 %       Oxygen Saturation (Exit) 97 %       Rating of Perceived Exertion (Exercise) 11       Perceived Dyspnea (Exercise) 1       Symptoms none       Comments results       Duration Progress to 30 minutes of  aerobic without signs/symptoms of physical distress       Intensity THRR New         Progression   Progression Continue to progress workloads to maintain intensity without signs/symptoms of physical distress.       Average METs 1.8                Exercise Comments:   Exercise Goals and Review:    Exercise Goals     Row Name 04/14/23 1129             Exercise Goals   Increase Physical Activity Yes       Intervention Provide advice, education, support and counseling about physical activity/exercise needs.;Develop an individualized exercise prescription for aerobic and resistive training based on initial evaluation findings, risk stratification, comorbidities and participant's personal goals.       Expected Outcomes Short Term: Attend rehab on a regular basis to increase amount of physical activity.;Long Term: Add in home exercise to make exercise part of routine and to increase amount of physical activity.;Long Term: Exercising regularly at least 3-5 days a week.       Increase Strength and Stamina Yes       Intervention Provide advice, education, support and counseling about physical activity/exercise needs.;Develop an individualized exercise prescription for aerobic and resistive training based on initial evaluation findings, risk stratification, comorbidities and participant's personal goals.       Expected Outcomes Short Term: Increase workloads from initial exercise prescription for resistance, speed, and METs.;Short Term: Perform resistance training exercises routinely during rehab and add in resistance training at home;Long Term: Improve cardiorespiratory fitness, muscular  endurance and strength as measured by increased METs and functional capacity ( )       Able to understand and use rate of perceived exertion (RPE) scale Yes       Intervention Provide education and explanation on how to use RPE scale       Expected Outcomes Short Term: Able to use RPE daily in rehab to express subjective intensity level;Long Term:  Able to use RPE to guide intensity level when exercising independently       Able to understand and use Dyspnea scale Yes       Intervention Provide education and explanation on how to use Dyspnea scale       Expected Outcomes Short Term: Able to use Dyspnea scale  daily in rehab to express subjective sense of shortness of breath during exertion;Long Term: Able to use Dyspnea scale to guide intensity level when exercising independently       Knowledge and understanding of Target Heart Rate Range (THRR) Yes       Intervention Provide education and explanation of THRR including how the numbers were predicted and where they are located for reference       Expected Outcomes Short Term: Able to state/look up THRR;Short Term: Able to use daily as guideline for intensity in rehab;Long Term: Able to use THRR to govern intensity when exercising independently       Able to check pulse independently Yes       Intervention Provide education and demonstration on how to check pulse in carotid and radial arteries.;Review the importance of being able to check your own pulse for safety during independent exercise       Expected Outcomes Short Term: Able to explain why pulse checking is important during independent exercise;Long Term: Able to check pulse independently and accurately       Understanding of Exercise Prescription Yes       Intervention Provide education, explanation, and written materials on patient's individual exercise prescription       Expected Outcomes Short Term: Able to explain program exercise prescription;Long Term: Able to explain home exercise prescription to exercise independently                Exercise Goals Re-Evaluation :   Discharge Exercise Prescription (Final Exercise Prescription Changes):  Exercise Prescription Changes - 04/14/23 1100       Response to Exercise   Blood Pressure (Admit) 100/78    Blood Pressure (Exercise) 136/84    Blood Pressure (Exit) 120/86    Heart Rate (Admit) 96 bpm    Heart Rate (Exercise) 101 bpm    Heart Rate (Exit) 100 bpm    Oxygen Saturation (Admit) 96 %    Oxygen Saturation (Exercise) 98 %    Oxygen Saturation (Exit) 97 %    Rating of Perceived Exertion (Exercise) 11    Perceived Dyspnea  (Exercise) 1    Symptoms none    Comments results    Duration Progress to 30 minutes of  aerobic without signs/symptoms of physical distress    Intensity THRR New      Progression   Progression Continue to progress workloads to maintain intensity without signs/symptoms of physical distress.    Average METs 1.8             Nutrition:  Target Goals: Understanding of nutrition guidelines, daily intake of sodium 1500mg , cholesterol 200mg , calories 30% from fat and 7% or less from saturated fats, daily to have 5 or more servings  of fruits and vegetables.  Education: All About Nutrition: -Group instruction provided by verbal, written material, interactive activities, discussions, models, and posters to present general guidelines for heart healthy nutrition including fat, fiber, MyPlate, the role of sodium in heart healthy nutrition, utilization of the nutrition label, and utilization of this knowledge for meal planning. Follow up email sent as well. Written material given at graduation.   Biometrics:  Pre Biometrics - 04/14/23 1130       Pre Biometrics   Height 5\' 7"  (1.702 m)    Weight 260 lb 4.8 oz (118.1 kg)    Waist Circumference 48 inches    Hip Circumference 54 inches    Waist to Hip Ratio 0.89 %    BMI (Calculated) 40.76    Single Leg Stand 2 seconds              Nutrition Therapy Plan and Nutrition Goals:  Nutrition Therapy & Goals - 04/14/23 1131       Personal Nutrition Goals   Nutrition Goal Wants to schedule appointment in the coming weeks after discussing with family      Intervention Plan   Intervention Prescribe, educate and counsel regarding individualized specific dietary modifications aiming towards targeted core components such as weight, hypertension, lipid management, diabetes, heart failure and other comorbidities.;Nutrition handout(s) given to patient.    Expected Outcomes Short Term Goal: Understand basic principles of dietary content, such  as calories, fat, sodium, cholesterol and nutrients.;Short Term Goal: A plan has been developed with personal nutrition goals set during dietitian appointment.;Long Term Goal: Adherence to prescribed nutrition plan.             Nutrition Assessments:  MEDIFICTS Score Key: >=70 Need to make dietary changes  40-70 Heart Healthy Diet <= 40 Therapeutic Level Cholesterol Diet   Picture Your Plate Scores: <18 Unhealthy dietary pattern with much room for improvement. 41-50 Dietary pattern unlikely to meet recommendations for good health and room for improvement. 51-60 More healthful dietary pattern, with some room for improvement.  >60 Healthy dietary pattern, although there may be some specific behaviors that could be improved.    Nutrition Goals Re-Evaluation:   Nutrition Goals Discharge (Final Nutrition Goals Re-Evaluation):   Psychosocial: Target Goals: Acknowledge presence or absence of significant depression and/or stress, maximize coping skills, provide positive support system. Participant is able to verbalize types and ability to use techniques and skills needed for reducing stress and depression.   Education: Stress, Anxiety, and Depression - Group verbal and visual presentation to define topics covered.  Reviews how body is impacted by stress, anxiety, and depression.  Also discusses healthy ways to reduce stress and to treat/manage anxiety and depression.  Written material given at graduation.   Education: Sleep Hygiene -Provides group verbal and written instruction about how sleep can affect your health.  Define sleep hygiene, discuss sleep cycles and impact of sleep habits. Review good sleep hygiene tips.    Initial Review & Psychosocial Screening:  Initial Psych Review & Screening - 04/11/23 1123       Initial Review   Current issues with Current Sleep Concerns;Current Stress Concerns    Source of Stress Concerns Chronic Illness      Family Dynamics   Good  Support System? Yes    Comments Corydon sleeps with a CPAP but he has not been able to use it since he gets up alot and cant sleep enough to use it. He can look to his wife for support.  Barriers   Psychosocial barriers to participate in program The patient should benefit from training in stress management and relaxation.      Screening Interventions   Interventions Encouraged to exercise;To provide support and resources with identified psychosocial needs;Provide feedback about the scores to participant    Expected Outcomes Short Term goal: Utilizing psychosocial counselor, staff and physician to assist with identification of specific Stressors or current issues interfering with healing process. Setting desired goal for each stressor or current issue identified.;Long Term Goal: Stressors or current issues are controlled or eliminated.;Short Term goal: Identification and review with participant of any Quality of Life or Depression concerns found by scoring the questionnaire.;Long Term goal: The participant improves quality of Life and PHQ9 Scores as seen by post scores and/or verbalization of changes             Quality of Life Scores:   Scores of 19 and below usually indicate a poorer quality of life in these areas.  A difference of  2-3 points is a clinically meaningful difference.  A difference of 2-3 points in the total score of the Quality of Life Index has been associated with significant improvement in overall quality of life, self-image, physical symptoms, and general health in studies assessing change in quality of life.  PHQ-9: Review Flowsheet       04/14/2023 03/15/2023 06/01/2022 10/17/2020  Depression screen PHQ 2/9  Decreased Interest 1 0 3 0  Down, Depressed, Hopeless 0 0 3 0  PHQ - 2 Score 1 0 6 0  Altered sleeping 2 - 3 3  Tired, decreased energy 1 - 3 3  Change in appetite 0 - 2 0  Feeling bad or failure about yourself  0 - 2 1  Trouble concentrating 1 - 3 1  Moving  slowly or fidgety/restless 0 - 3 1  Suicidal thoughts 0 - 0 0  PHQ-9 Score 5 - 22 9  Difficult doing work/chores Somewhat difficult - Extremely dIfficult Somewhat difficult   Interpretation of Total Score  Total Score Depression Severity:  1-4 = Minimal depression, 5-9 = Mild depression, 10-14 = Moderate depression, 15-19 = Moderately severe depression, 20-27 = Severe depression   Psychosocial Evaluation and Intervention:  Psychosocial Evaluation - 04/11/23 1125       Psychosocial Evaluation & Interventions   Interventions Relaxation education;Stress management education;Encouraged to exercise with the program and follow exercise prescription    Comments Trevor sleeps with a CPAP but he has not been able to use it since he gets up alot and cant sleep enough to use it. He can look to his wife for support.    Expected Outcomes Short: Start HeartTrack to help with mood. Long: Maintain a healthy mental state    Continue Psychosocial Services  Follow up required by staff             Psychosocial Re-Evaluation:   Psychosocial Discharge (Final Psychosocial Re-Evaluation):   Vocational Rehabilitation: Provide vocational rehab assistance to qualifying candidates.   Vocational Rehab Evaluation & Intervention:   Education: Education Goals: Education classes will be provided on a variety of topics geared toward better understanding of heart health and risk factor modification. Participant will state understanding/return demonstration of topics presented as noted by education test scores.  Learning Barriers/Preferences:  Learning Barriers/Preferences - 04/11/23 1123       Learning Barriers/Preferences   Learning Barriers None    Learning Preferences None             General  Cardiac Education Topics:  AED/CPR: - Group verbal and written instruction with the use of models to demonstrate the basic use of the AED with the basic ABC's of resuscitation.   Anatomy and Cardiac  Procedures: - Group verbal and visual presentation and models provide information about basic cardiac anatomy and function. Reviews the testing methods done to diagnose heart disease and the outcomes of the test results. Describes the treatment choices: Medical Management, Angioplasty, or Coronary Bypass Surgery for treating various heart conditions including Myocardial Infarction, Angina, Valve Disease, and Cardiac Arrhythmias.  Written material given at graduation.   Medication Safety: - Group verbal and visual instruction to review commonly prescribed medications for heart and lung disease. Reviews the medication, class of the drug, and side effects. Includes the steps to properly store meds and maintain the prescription regimen.  Written material given at graduation.   Intimacy: - Group verbal instruction through game format to discuss how heart and lung disease can affect sexual intimacy. Written material given at graduation..   Know Your Numbers and Heart Failure: - Group verbal and visual instruction to discuss disease risk factors for cardiac and pulmonary disease and treatment options.  Reviews associated critical values for Overweight/Obesity, Hypertension, Cholesterol, and Diabetes.  Discusses basics of heart failure: signs/symptoms and treatments.  Introduces Heart Failure Zone chart for action plan for heart failure.  Written material given at graduation.   Infection Prevention: - Provides verbal and written material to individual with discussion of infection control including proper hand washing and proper equipment cleaning during exercise session. Flowsheet Row Cardiac Rehab from 04/14/2023 in [facility] Cardiac [facility] Pulmonary Rehab  Date 04/14/23  Educator MB  Instruction Review Code 1- Verbalizes Understanding       Falls Prevention: - Provides verbal and written material to individual with discussion of falls prevention and safety. Flowsheet Row Cardiac Rehab from 04/14/2023  in [facility] Cardiac and Pulmonary Rehab  Date 04/14/23  Educator MB  Instruction Review Code 1- Verbalizes Understanding       Other: -Provides group and verbal instruction on various topics (see comments)   Knowledge Questionnaire Score:   Core Components/Risk Factors/Patient Goals at Admission:  Personal Goals and Risk Factors at Admission - 04/14/23 1132       Core Components/Risk Factors/Patient Goals on Admission    Weight Management Yes;Weight Loss    Intervention Weight Management: Develop a combined nutrition and exercise program designed to reach desired caloric intake, while maintaining appropriate intake of nutrient and fiber, sodium and fats, and appropriate energy expenditure required for the weight goal.;Weight Management: Provide education and appropriate resources to help participant work on and attain dietary goals.;Weight Management/Obesity: Establish reasonable short term and long term weight goals.    Admit Weight 260 lb 4.8 oz (118.1 kg)    Goal Weight: Short Term 255 lb (115.7 kg)    Goal Weight: Long Term 250 lb (113.4 kg)    Expected Outcomes Long Term: Adherence to nutrition and physical activity/exercise program aimed toward attainment of established weight goal;Short Term: Continue to assess and modify interventions until short term weight is achieved;Weight Loss: Understanding of general recommendations for a balanced deficit meal plan, which promotes 1-2 lb weight loss per week and includes a negative energy balance of [id number] kcal/d;Understanding recommendations for meals to include 15-35% energy as protein, 25-35% energy from fat, 35-60% energy from carbohydrates, less than 200mg  of dietary cholesterol, 20-35 gm of total fiber daily;Understanding of distribution of calorie intake throughout the day with the consumption  of 4-5 meals/snacks    Heart Failure Yes    Intervention Provide a combined exercise and nutrition program that is supplemented with education,  support and counseling about heart failure. Directed toward relieving symptoms such as shortness of breath, decreased exercise tolerance, and extremity edema.    Expected Outcomes Improve functional capacity of life;Short term: Attendance in program 2-3 days a week with increased exercise capacity. Reported lower sodium intake. Reported increased fruit and vegetable intake. Reports medication compliance.;Short term: Daily weights obtained and reported for increase. Utilizing diuretic protocols set by physician.    Hypertension Yes    Intervention Provide education on lifestyle modifcations including regular physical activity/exercise, weight management, moderate sodium restriction and increased consumption of fresh fruit, vegetables, and low fat dairy, alcohol moderation, and smoking cessation.;Monitor prescription use compliance.    Expected Outcomes Short Term: Continued assessment and intervention until BP is < 140/11mm HG in hypertensive participants. < 130/34mm HG in hypertensive participants with diabetes, heart failure or chronic kidney disease.;Long Term: Maintenance of blood pressure at goal levels.             Education:Diabetes - Individual verbal and written instruction to review signs/symptoms of diabetes, desired ranges of glucose level fasting, after meals and with exercise. Acknowledge that pre and post exercise glucose checks will be done for 3 sessions at entry of program.   Core Components/Risk Factors/Patient Goals Review:    Core Components/Risk Factors/Patient Goals at Discharge (Final Review):    ITP Comments:  ITP Comments     Row Name 04/11/23 1128 04/14/23 1122         ITP Comments Virtual Visit completed. Patient informed on EP and RD appointment and 6 Minute walk test. Patient also informed of patient health questionnaires on My Chart. Patient Verbalizes understanding. Visit diagnosis can be found in [facility] 02/01/2023. Completed and gym orientation. Initial  ITP created and sent for review to Dr. [name], Medical Director.               Comments: Initial ITP

## 2023-04-14 NOTE — Progress Notes (Signed)
Subjective:  Patient ID: John Wolf, male    DOB: 03-27-1956  Age: 67 y.o. MRN: [id number]  CC: The primary encounter diagnosis was Acute blood loss anemia. Diagnoses of Degenerative lumbar spinal stenosis, Morbid obesity with BMI of 40.0-44.9, adult (HCC), and PAF (paroxysmal atrial fibrillation) (HCC) were also pertinent to this visit.   HPI John Wolf presents for  Chief Complaint  Patient presents with   Hospitalization Follow-up   Mr. Ogren has had a complicated  several months.  His  hospital course following an ascending aortic aneurysm repair / AVR / [facility] in October 2024 was complicated by severe anemia. Shortly after discharge  he was readmitted with dark red hematochezia and radiographic evidence of a large thoracic hematoma and a hgb of 4 . He required PRBCs for resuscitation and underwent an EGD which revealed several clean-based Duodenal ulcers  that were suspected to be the source of his melena. He was discharged only to be readmitted shortly thereafter with persistent hematochezia and persistent anemia. Virtual capsule endoscopy  revealed active bleeding in the mid-distal SB thought to be the proximal ileum. A DBE was then performed by my colleague Dr. Faylene Million who found and treated 5 non-bleeding angioectasias. His hemoglobin remained stable and he was discharged home on Nov 5 with Home Health referral made for PT>   He has done well since with no further overt bleeding. His apixiban had been held for his bleeding but has since been restarted without incident. He is no longer taking aspirin.   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  Receiving IV iron infusions  and hgb has improved from 8.8 in Nov to 11.3 on Dec 13   Low back pain,  controlled without any pain meds   : Saw neurosurgeon Montez Morita on Dec 17 for persistent right leg weakness.  No new imaging, no surgery recommended .  Pain managemet and PT  recommended  Atrial flutter:  Underwent MAZE w/ LAA ligation on 10/9. Required DCCV during second  admission for Afib w/ RVR. AC was held but deemed safe to restart on discharge .   taking amiodarone and eliquis  Hypotension:   flomax stopped but BP remains low.  He is asymptomatic    Outpatient Medications Prior to Visit  Medication Sig Dispense Refill   ALPRAZolam (XANAX) 0.25 MG tablet Take 1 tablet (0.25 mg total) by mouth at bedtime as needed for anxiety. Or insomnia 30 tablet 0   amiodarone (PACERONE) 200 MG tablet Take 200 mg by mouth daily. Taking 2 tablets daily     apixaban (ELIQUIS) 5 MG TABS tablet Take 5 mg by mouth 2 (two) times daily.     atorvastatin (LIPITOR) 80 MG tablet Take 80 mg by mouth daily.     furosemide (LASIX) 40 MG tablet Take 1 tablet (40 mg total) by mouth daily. 90 tablet 1   Melatonin 1 MG CAPS Take by mouth.     metoprolol tartrate (LOPRESSOR) 25 MG tablet Take 25 mg by mouth 2 (two) times daily.     pantoprazole (PROTONIX) 40 MG tablet Take by mouth.     spironolactone (ALDACTONE) 25 MG tablet Take 1 tablet by mouth daily.     apixaban (ELIQUIS) 5 MG TABS tablet Take by mouth. (Patient not taking: Reported on 04/14/2023)     aspirin 81 MG chewable tablet Chew 81 mg by mouth daily. (Patient not taking: Reported on 03/15/2023)  cefadroxil (DURICEF) 500 MG capsule Take 500 mg by mouth every 12 (twelve) hours. (Patient not taking: Reported on 04/14/2023)     digoxin (LANOXIN) 0.125 MG tablet Take 125 mcg by mouth daily. (Patient not taking: Reported on 04/14/2023)     Iron, Ferrous Sulfate, 325 (65 Fe) MG TABS One tablet daily after a meal (Patient not taking: Reported on 03/15/2023) 30 tablet 2   meloxicam (MOBIC) 7.5 MG tablet Take 7.5 mg by mouth daily. (Patient not taking: Reported on 04/14/2023)     metFORMIN (GLUCOPHAGE-XR) 500 MG 24 hr tablet Take 500 mg by mouth daily with breakfast. (Patient not taking: Reported on 02/11/2023)     metoprolol  succinate (TOPROL-XL) 50 MG 24 hr tablet Take 50 mg by mouth 2 (two) times daily. (Patient not taking: Reported on 04/14/2023)     mirtazapine (REMERON) 15 MG tablet 1/2 to 1 tablet 30 minutes before bedtime daily (Patient not taking: Reported on 04/14/2023) 30 tablet 2   SENEXON-S 8.6-50 MG tablet Take 1 tablet by mouth daily. (Patient not taking: Reported on 04/14/2023)     tamsulosin (FLOMAX) 0.4 MG CAPS capsule Take 0.4 mg by mouth daily. (Patient not taking: Reported on 02/11/2023)     Vitamin D, Ergocalciferol, 50000 units CAPS Take 1 capsule by mouth once a week. (Patient not taking: Reported on 04/14/2023)     No facility-administered medications prior to visit.    Review of Systems;  Patient denies headache, fevers, malaise, unintentional weight loss, skin rash, eye pain, sinus congestion and sinus pain, sore throat, dysphagia,  hemoptysis , cough, dyspnea, wheezing, chest pain, palpitations, orthopnea, edema, abdominal pain, nausea, melena, diarrhea, constipation, flank pain, dysuria, hematuria, urinary  Frequency, nocturia, numbness, tingling, seizures,  Focal weakness, Loss of consciousness,  Tremor, insomnia, depression, anxiety, and suicidal ideation.      Objective:  BP 106/70   Pulse (!) 102   Ht 5\' 7"  (1.702 m)   Wt 260 lb 6.4 oz (118.1 kg)   SpO2 96%   BMI 40.78 kg/m   BP Readings from Last 3 Encounters:  04/14/23 106/70  04/07/23 113/79  04/04/23 114/77    Wt Readings from Last 3 Encounters:  04/14/23 260 lb 6.4 oz (118.1 kg)  04/14/23 260 lb 4.8 oz (118.1 kg)  03/15/23 263 lb 9.6 oz (119.6 kg)    Physical Exam Vitals reviewed.  Constitutional:      General: He is not in acute distress.    Appearance: He is obese. He is not ill-appearing, toxic-appearing or diaphoretic.  HENT:     Head: Normocephalic.  Eyes:     General: No scleral icterus.       Right eye: No discharge.        Left eye: No discharge.     Conjunctiva/sclera: Conjunctivae normal.   Cardiovascular:     Rate and Rhythm: Normal rate and regular rhythm.     Heart sounds: Normal heart sounds.  Pulmonary:     Effort: Pulmonary effort is normal. No respiratory distress.     Breath sounds: Normal breath sounds.  Musculoskeletal:        General: Normal range of motion.     Cervical back: Normal range of motion.  Skin:    General: Skin is warm and dry.  Neurological:     General: No focal deficit present.     Mental Status: He is alert and oriented to person, place, and time. Mental status is at baseline.  Psychiatric:  Mood and Affect: Mood normal.        Behavior: Behavior normal.        Thought Content: Thought content normal.        Judgment: Judgment normal.    Lab Results  Component Value Date   HGBA1C 4.7 03/10/2021   HGBA1C 5.3 05/25/2019   HGBA1C 5.2 06/16/2018    Lab Results  Component Value Date   CREATININE 0.85 03/11/2023   CREATININE 0.65 02/18/2023   CREATININE 0.88 02/12/2023    Lab Results  Component Value Date   WBC 5.6 03/11/2023   HGB 9.1 (L) 03/11/2023   HCT 29.9 (L) 03/11/2023   PLT 228 03/11/2023   GLUCOSE 103 (H) 03/11/2023   CHOL 166 06/01/2022   TRIG 92 06/01/2022   HDL 70 06/01/2022   LDLDIRECT 106.0 06/16/2018   LDLCALC 78 06/01/2022   ALT 32 02/12/2023   AST 27 02/12/2023   NA 137 03/11/2023   K 4.0 03/11/2023   CL 103 03/11/2023   CREATININE 0.85 03/11/2023   BUN 22 03/11/2023   CO2 26 03/11/2023   TSH 0.86 03/10/2021   PSA 0.86 06/01/2022   HGBA1C 4.7 03/10/2021   MICROALBUR 0.8 06/01/2022    No results found.  Assessment & Plan:  .Acute blood loss anemia Assessment & Plan: Secondary to multiple duodenal ulcers and AVM's found on enteroscopy during last admission at [facility].    He received 4 units of PRBCs at [facility] prior to transfer to [facility],  followed by at least one utin at [facility].  He has been receiving IV iron  at [facility] and hemoglboin  from [facility] discharge hgb on 10/22  but  Iron stores are low.  Will  supplement  Lab Results  Component Value Date   WBC 5.6 03/11/2023   HGB 9.1 (L) 03/11/2023   HCT 29.9 (L) 03/11/2023   MCV 91.7 03/11/2023   PLT 228 03/11/2023   Lab Results  Component Value Date   IRON 40 (L) 02/18/2023   TIBC 293 02/18/2023       Orders: -     CBC with Differential/Platelet; Future -     Basic metabolic panel; Future  Degenerative lumbar spinal stenosis Assessment & Plan: Reevaluated with MRI Feb 2024. Cuurently managed without chronic medications.  No surgical intervention was advised.   Morbid obesity with BMI of 40.0-44.9, adult [facility]) Assessment & Plan: He has periodically been able to lose significant amounts of weight with Weight Watchers and other regimented programs   PAF (paroxysmal atrial fibrillation) (HCC) Assessment & Plan: He is maintaingin SR with amiodarone and metoprolol. But requires continued A/C curently , hopefully not long term    Other orders -     traZODone HCl; Take 0.5-1 tablets (25-50 mg total) by mouth at bedtime as needed for sleep.  Dispense: 90 tablet; Refill: 3     I provided 30 minutes of face-to-face time during this encounter reviewing patient's last visit with me, patient's  most recent visit with cardiology,  gastroenterology, and hematology,  previous surgical and  non surgical procedures, previous  labs and imaging studies, counseling on currently addressed issues,  and post visit ordering to diagnostics and therapeutics .   Follow-up: No follow-ups on file.   Sherlene Shams, MD

## 2023-04-16 NOTE — Assessment & Plan Note (Signed)
He has periodically been able to lose significant amounts of weight with Weight Watchers and other regimented programs

## 2023-04-16 NOTE — Assessment & Plan Note (Signed)
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    He received 4 units of PRBCs at [facility] prior to transfer to [facility],  followed by at least one utin at [facility]  He has been receiving IV iron  at [facility] and hemoglboin  from [facility] discharge hgb on 10/22  but  Iron stores are low.  Will supplement  Lab Results  Component Value Date   WBC 5.6 03/11/2023   HGB 9.1 (L) 03/11/2023   HCT 29.9 (L) 03/11/2023   MCV 91.7 03/11/2023   PLT 228 03/11/2023   Lab Results  Component Value Date   IRON 40 (L) 02/18/2023   TIBC 293 02/18/2023

## 2023-04-16 NOTE — Assessment & Plan Note (Addendum)
Reevaluated with MRI Feb 2024. Cuurently managed without chronic medications.  No surgical intervention was advised.

## 2023-04-16 NOTE — Assessment & Plan Note (Signed)
He is maintaingin SR with amiodarone and metoprolol. But requires continued A/C curently , hopefully not long term

## 2023-04-21 ENCOUNTER — Encounter: Payer: Medicare Other | Admitting: *Deleted

## 2023-04-21 DIAGNOSIS — Z952 Presence of prosthetic heart valve: Secondary | ICD-10-CM | POA: Diagnosis not present

## 2023-04-21 NOTE — Progress Notes (Signed)
Daily Session Note  Patient Details  Name: John Wolf MRN: [id number] Date of Birth: [birth year] Referring Provider:   Flowsheet Row Cardiac Rehab from 04/14/2023 in [facility] Cardiac and Pulmonary Rehab  Referring Provider [name], MD       Encounter Date: 04/21/2023  Check In:  Session Check In - 04/21/23 0931       Check-In   Supervising physician immediately available to respond to emergencies See telemetry face sheet for immediately available ER MD    Location [facility]-Cardiac & Pulmonary Rehab    Staff Present Cora Collum, RN, BSN, CCRP;Joseph Rew, Guinevere Ferrari, RN, [place]    Virtual Visit No    Medication changes reported     No    Fall or balance concerns reported    No    Warm-up and Cool-down Performed on first and last piece of equipment    Resistance Training Performed Yes    VAD Patient? No    PAD/SET Patient? No      Pain Assessment   Currently in Pain? No/denies                Social History   Tobacco Use  Smoking Status Never  Smokeless Tobacco Never    Goals Met:  Independence with exercise equipment Exercise tolerated well No report of concerns or symptoms today Strength training completed today  Goals Unmet:  Not Applicable  Comments: First full day of exercise!  Patient was oriented to gym and equipment including functions, settings, policies, and procedures.  Patient's individual exercise prescription and treatment plan were reviewed.  All starting workloads were established based on the results of the 6 minute walk test done at initial orientation visit.  The plan for exercise progression was also introduced and progression will be customized based on patient's performance and goals.    Dr. [name] is Medical Director for [facility] Cardiac Rehabilitation.  Dr. [name] is Medical Director for [facility] Pulmonary Rehabilitation.

## 2023-04-25 ENCOUNTER — Other Ambulatory Visit (INDEPENDENT_AMBULATORY_CARE_PROVIDER_SITE_OTHER): Payer: Medicare Other

## 2023-04-25 DIAGNOSIS — D62 Acute posthemorrhagic anemia: Secondary | ICD-10-CM | POA: Diagnosis not present

## 2023-04-25 LAB — CBC WITH DIFFERENTIAL/PLATELET
Basophils Absolute: 0.1 10*3/uL (ref 0.0–0.1)
Basophils Relative: 2.1 % (ref 0.0–3.0)
Eosinophils Absolute: 0.2 10*3/uL (ref 0.0–0.7)
Eosinophils Relative: 4.6 % (ref 0.0–5.0)
HCT: 38.6 % — ABNORMAL LOW (ref 39.0–52.0)
Hemoglobin: 12.7 g/dL — ABNORMAL LOW (ref 13.0–17.0)
Lymphocytes Relative: 19.7 % (ref 12.0–46.0)
Lymphs Abs: 0.7 10*3/uL (ref 0.7–4.0)
MCHC: 32.9 g/dL (ref 30.0–36.0)
MCV: 85.5 fL (ref 78.0–100.0)
Monocytes Absolute: 0.5 10*3/uL (ref 0.1–1.0)
Monocytes Relative: 14.8 % — ABNORMAL HIGH (ref 3.0–12.0)
Neutro Abs: 2.2 10*3/uL (ref 1.4–7.7)
Neutrophils Relative %: 58.8 % (ref 43.0–77.0)
Platelets: 193 10*3/uL (ref 150.0–400.0)
RBC: 4.52 Mil/uL (ref 4.22–5.81)
RDW: 21.1 % — ABNORMAL HIGH (ref 11.5–15.5)
WBC: 3.7 10*3/uL — ABNORMAL LOW (ref 4.0–10.5)

## 2023-04-25 LAB — BASIC METABOLIC PANEL
BUN: 24 mg/dL — ABNORMAL HIGH (ref 6–23)
CO2: 31 meq/L (ref 19–32)
Calcium: 9.2 mg/dL (ref 8.4–10.5)
Chloride: 103 meq/L (ref 96–112)
Creatinine, Ser: 0.94 mg/dL (ref 0.40–1.50)
GFR: 85.22 mL/min (ref >60.00)
Glucose, Bld: 95 mg/dL (ref 70–99)
Potassium: 4.2 meq/L (ref 3.5–5.1)
Sodium: 142 meq/L (ref 135–145)

## 2023-04-26 ENCOUNTER — Encounter: Payer: Medicare Other | Admitting: *Deleted

## 2023-04-26 DIAGNOSIS — Z952 Presence of prosthetic heart valve: Secondary | ICD-10-CM | POA: Diagnosis not present

## 2023-04-26 NOTE — Progress Notes (Signed)
 Daily Session Note  Patient Details  Name: John Wolf MRN: [id number] Date of Birth: 09-Apr-1956 Referring Provider:   Flowsheet Row Cardiac Rehab from 04/14/2023 in [facility] Cardiac and Pulmonary Rehab  Referring Provider Sedalia Bring, MD       Encounter Date: 04/26/2023  Check In:  Session Check In - 04/26/23 0936       Check-In   Supervising physician immediately available to respond to emergencies See telemetry face sheet for immediately available ER MD    Location [facility]-Cardiac & Pulmonary Rehab    Staff Present Othel Durand, RN, BSN, CCRP;Margaret Best, MS, Exercise Physiologist;[name], BS, Exercise Physiologist;[name] BS, , Exercise Physiologist    Virtual Visit No    Medication changes reported     No    Fall or balance concerns reported    No    Warm-up and Cool-down Performed on first and last piece of equipment    Resistance Training Performed Yes    VAD Patient? No    PAD/SET Patient? No      Pain Assessment   Currently in Pain? No/denies                Social History   Tobacco Use  Smoking Status Never  Smokeless Tobacco Never    Goals Met:  Independence with exercise equipment Exercise tolerated well No report of concerns or symptoms today  Goals Unmet:  Not Applicable  Comments: Pt able to follow exercise prescription today without complaint.  Will continue to monitor for progression.    Dr. Oneil Pinal is Medical Director for [facility] Cardiac Rehabilitation.  Dr. Fuad Aleskerov is Medical Director for [facility] Pulmonary Rehabilitation.

## 2023-04-28 ENCOUNTER — Encounter: Payer: Medicare Other | Attending: Family Medicine | Admitting: *Deleted

## 2023-04-28 DIAGNOSIS — Z952 Presence of prosthetic heart valve: Secondary | ICD-10-CM | POA: Insufficient documentation

## 2023-04-28 NOTE — Progress Notes (Signed)
 Daily Session Note  Patient Details  Name: John Wolf MRN: [id number] Date of Birth: 1955-11-15 Referring Provider:   Flowsheet Row Cardiac Rehab from 04/14/2023 in [facility] Cardiac and Pulmonary Rehab  Referring Provider Sedalia Bring, MD       Encounter Date: 04/28/2023  Check In:  Session Check In - 04/28/23 0932       Check-In   Supervising physician immediately available to respond to emergencies See telemetry face sheet for immediately available ER MD    Location [facility]-Cardiac & Pulmonary Rehab    Staff Present Othel Durand, RN, BSN, CCRP;Margaret Best, MS, Exercise Physiologist;[name] BS, , Exercise Physiologist;Marcelle Bebout Claudene, RN, ADN    Virtual Visit No    Medication changes reported     No    Fall or balance concerns reported    No    Warm-up and Cool-down Performed on first and last piece of equipment    Resistance Training Performed Yes    VAD Patient? No    PAD/SET Patient? No      Pain Assessment   Currently in Pain? No/denies                Social History   Tobacco Use  Smoking Status Never  Smokeless Tobacco Never    Goals Met:  Independence with exercise equipment Exercise tolerated well No report of concerns or symptoms today Strength training completed today  Goals Unmet:  Not Applicable  Comments: Pt able to follow exercise prescription today without complaint.  Will continue to monitor for progression.    Dr. Oneil Pinal is Medical Director for [facility] Cardiac Rehabilitation.  Dr. Fuad Aleskerov is Medical Director for [facility] Pulmonary Rehabilitation.

## 2023-04-29 ENCOUNTER — Telehealth: Payer: Self-pay

## 2023-04-29 NOTE — Telephone Encounter (Signed)
 Copied from CRM [id number]. Topic: Clinical - Lab/Test Results >> Apr 29, 2023  4:02 PM Sim Boast F wrote: Reason for CRM: John Wolf called requesting a call back regarding patients lab results

## 2023-04-29 NOTE — Telephone Encounter (Signed)
 Spoke with Frazier and informed her of pt's lab results. Teena gave a verbal understanding. Frazier also wanted to let you know that they just got home from cardiology appt and they stated that pt is back in Afib. She stated that they are planing to try shocking him back in rhythm next week.

## 2023-04-30 ENCOUNTER — Other Ambulatory Visit: Payer: Self-pay | Admitting: Internal Medicine

## 2023-05-02 NOTE — Telephone Encounter (Signed)
 Medication was discontinued on 04/14/2023. It is okay to refuse?

## 2023-05-03 ENCOUNTER — Encounter: Payer: Medicare Other | Admitting: *Deleted

## 2023-05-03 DIAGNOSIS — Z952 Presence of prosthetic heart valve: Secondary | ICD-10-CM

## 2023-05-03 NOTE — Progress Notes (Signed)
 Daily Session Note  Patient Details  Name: John Wolf MRN: [id number] Date of Birth: 1955-08-30 Referring Provider:   Flowsheet Row Cardiac Rehab from 04/14/2023 in [facility] Cardiac and Pulmonary Rehab  Referring Provider Sedalia Bring, MD       Encounter Date: 05/03/2023  Check In:  Session Check In - 05/03/23 0950       Check-In   Supervising physician immediately available to respond to emergencies See telemetry face sheet for immediately available ER MD    Location [facility]-Cardiac & Pulmonary Rehab    Staff Present Rollene Paterson, MS, Exercise Physiologist;Dymir Neeson, RN, BSN, CCRP;[name], BS, Exercise Physiologist;[name] BS, , Exercise Physiologist    Virtual Visit No    Medication changes reported     No    Fall or balance concerns reported    No    Warm-up and Cool-down Performed on first and last piece of equipment    Resistance Training Performed Yes    VAD Patient? No    PAD/SET Patient? No      Pain Assessment   Currently in Pain? No/denies                Social History   Tobacco Use  Smoking Status Never  Smokeless Tobacco Never    Goals Met:  Independence with exercise equipment Exercise tolerated well No report of concerns or symptoms today  Goals Unmet:  Not Applicable  Comments: Pt able to follow exercise prescription today without complaint.  Will continue to monitor for progression.    Dr. Oneil Pinal is Medical Director for [facility] Cardiac Rehabilitation.  Dr. Fuad Aleskerov is Medical Director for [facility] Pulmonary Rehabilitation.

## 2023-05-04 ENCOUNTER — Other Ambulatory Visit: Payer: Self-pay | Admitting: Internal Medicine

## 2023-05-04 MED ORDER — APIXABAN 5 MG PO TABS: 5.0000 mg | ORAL_TABLET | Freq: Two times a day (BID) | ORAL | 5 refills | Status: DC

## 2023-05-05 ENCOUNTER — Encounter: Payer: Medicare Other | Admitting: *Deleted

## 2023-05-05 DIAGNOSIS — Z952 Presence of prosthetic heart valve: Secondary | ICD-10-CM

## 2023-05-05 NOTE — Progress Notes (Signed)
 Daily Session Note  Patient Details  Name: John Wolf MRN: [id number] Date of Birth: Feb 20, 1956 Referring Provider:   Flowsheet Row Cardiac Rehab from 04/14/2023 in [facility] Cardiac and Pulmonary Rehab  Referring Provider Sedalia Bring, MD       Encounter Date: 05/05/2023  Check In:  Session Check In - 05/05/23 0929       Check-In   Supervising physician immediately available to respond to emergencies See telemetry face sheet for immediately available ER MD    Location [facility]-Cardiac & Pulmonary Rehab    Staff Present Othel Durand, RN, BSN, CCRP;Joseph Hood, RCP,RRT,BSRT;Maxon [place] BS, , Exercise Physiologist;Kedric Bumgarner Claudene, RN, [place]    Virtual Visit No    Medication changes reported     No    Fall or balance concerns reported    No    Warm-up and Cool-down Performed on first and last piece of equipment    Resistance Training Performed Yes    VAD Patient? No    PAD/SET Patient? No      Pain Assessment   Currently in Pain? No/denies                Social History   Tobacco Use  Smoking Status Never  Smokeless Tobacco Never    Goals Met:  Independence with exercise equipment Exercise tolerated well No report of concerns or symptoms today Strength training completed today  Goals Unmet:  Not Applicable  Comments: Pt able to follow exercise prescription today without complaint.  Will continue to monitor for progression.    Dr. Oneil Pinal is Medical Director for [facility] Cardiac Rehabilitation.  Dr. Fuad Aleskerov is Medical Director for [facility] Pulmonary Rehabilitation.

## 2023-05-09 ENCOUNTER — Other Ambulatory Visit: Payer: Self-pay | Admitting: Internal Medicine

## 2023-05-09 MED ORDER — ONDANSETRON 4 MG PO TBDP: 4.0000 mg | ORAL_TABLET | Freq: Three times a day (TID) | ORAL | 0 refills | Status: AC | PRN

## 2023-05-11 ENCOUNTER — Encounter: Payer: Self-pay | Admitting: Internal Medicine

## 2023-05-11 ENCOUNTER — Telehealth: Payer: Medicare Other | Admitting: Internal Medicine

## 2023-05-11 ENCOUNTER — Other Ambulatory Visit: Payer: Self-pay | Admitting: Internal Medicine

## 2023-05-11 VITALS — BP 112/86 | Ht 67.0 in | Wt 251.0 lb

## 2023-05-11 DIAGNOSIS — R197 Diarrhea, unspecified: Secondary | ICD-10-CM | POA: Diagnosis not present

## 2023-05-11 DIAGNOSIS — Z952 Presence of prosthetic heart valve: Secondary | ICD-10-CM

## 2023-05-11 MED ORDER — SULFAMETHOXAZOLE-TRIMETHOPRIM 800-160 MG PO TABS: 1.0000 | ORAL_TABLET | Freq: Two times a day (BID) | ORAL | 0 refills | Status: AC

## 2023-05-11 MED ORDER — AZITHROMYCIN 500 MG PO TABS: ORAL_TABLET | ORAL | 0 refills | Status: DC

## 2023-05-11 NOTE — Progress Notes (Signed)
 Virtual Visit via Caregility   Note   This format is felt to be most appropriate for this patient at this time.  All issues noted in this document were discussed and addressed.  No physical exam was performed (except for noted visual exam findings with Video Visits).   I connected with John Wolf on 05/11/23 at  5:15 PM EST by a video enabled telemedicine application  and verified that I am speaking with the correct person using two identifiers. Location patient: home Location provider: work or home office Persons participating in the virtual visit: patient, provider  I discussed the limitations, risks, security and privacy concerns of performing an evaluation and management service by telephone and the availability of in person appointments. I also discussed with the patient that there may be a patient responsible charge related to this service. The patient expressed understanding and agreed to proceed.  Reason for visit: diarrhea   HPI:  68 yr old male  with history of  severe anemia secondary to recent lower GI bleed,  now resolved, recurrent atrial fibillation on amiodarone  and Eliquis  s/p cardioversion, awaiting  ablation scheduled for next week , presents with watery stools that have been occurring several times daily for the last 3-4 days .  Patient was feeling well until Sunday evening when he developed nausea that has slowly improved but not resolved.  No vomiting  . Sarted having loose stools on Monday,  denies cramping  but "stomach has been rumbling" constantly.  No  hematochezia.  Having chills/rigors for several days, woke up in a sweat this morning. No sick contacts. No recent antibiotics.   Ate [place] food middle of last week,  and has contact with farm animals (cows) on a daily basis     ROS: See pertinent positives and negatives per HPI.  Past Medical History:  Diagnosis Date   Acquired pes planus    Arthritis    Benign neoplasm of colon    History of left knee replacement     Hyperlipidemia    Hypertension    Obesity (BMI 30-39.9)    with 70 lb weight loss, ongoing   Onychia and paronychia of toe    Sleep apnea, obstructive 04/26/2000   wears CPAP    Past Surgical History:  Procedure Laterality Date   athroplasty left shoulder     COLONOSCOPY WITH PROPOFOL  N/A 08/27/2021   Procedure: COLONOSCOPY WITH PROPOFOL ;  Surgeon: Marnee Sink, MD;  Location: [facility];  Service: Endoscopy;  Laterality: N/A;   left shoulder     shoulder dislocation, rotator    POLYPECTOMY N/A 08/27/2021   Procedure: POLYPECTOMY;  Surgeon: Marnee Sink, MD;  Location: [facility];  Service: Endoscopy;  Laterality: N/A;    Family History  Problem Relation Age of Onset   Heart disease Mother    Stroke Father    Stroke Brother    Hypertension Brother    Cancer Brother 12       AML    SOCIAL HX:  reports that he has never smoked. He has never used smokeless tobacco. He reports current alcohol use. He reports that he does not use drugs.    Current Outpatient Medications:    ALPRAZolam  (XANAX ) 0.25 MG tablet, Take 1 tablet (0.25 mg total) by mouth at bedtime as needed for anxiety. Or insomnia, Disp: 30 tablet, Rfl: 0   apixaban  (ELIQUIS ) 5 MG TABS tablet, Take 1 tablet (5 mg total) by mouth 2 (two) times daily., Disp: 60 tablet, Rfl:  5   atorvastatin  (LIPITOR) 80 MG tablet, Take 80 mg by mouth daily., Disp: , Rfl:    furosemide  (LASIX ) 40 MG tablet, Take 1 tablet (40 mg total) by mouth daily., Disp: 90 tablet, Rfl: 1   Melatonin 1 MG CAPS, Take by mouth., Disp: , Rfl:    metoprolol  tartrate (LOPRESSOR ) 25 MG tablet, Take 25 mg by mouth 2 (two) times daily., Disp: , Rfl:    ondansetron  (ZOFRAN -ODT) 4 MG disintegrating tablet, Take 1 tablet (4 mg total) by mouth every 8 (eight) hours as needed for nausea or vomiting., Disp: 20 tablet, Rfl: 0   pantoprazole  (PROTONIX ) 40 MG tablet, Take by mouth., Disp: , Rfl:    spironolactone  (ALDACTONE ) 25 MG tablet, Take 1  tablet by mouth daily., Disp: , Rfl:    sulfamethoxazole -trimethoprim  (BACTRIM  DS) 800-160 MG tablet, Take 1 tablet by mouth 2 (two) times daily., Disp: 10 tablet, Rfl: 0   traZODone  (DESYREL ) 50 MG tablet, Take 0.5-1 tablets (25-50 mg total) by mouth at bedtime as needed for sleep., Disp: 90 tablet, Rfl: 3   amiodarone  (PACERONE ) 200 MG tablet, Take 200 mg by mouth daily. Taking 2 tablets daily, Disp: , Rfl:   EXAM:  VITALS per patient if applicable:  GENERAL: alert, oriented, appears well and in no acute distress  HEENT: atraumatic, conjunttiva clear, no obvious abnormalities on inspection of external nose and ears  NECK: normal movements of the head and neck  LUNGS: on inspection no signs of respiratory distress, breathing rate appears normal, no obvious gross SOB, gasping or wheezing  CV: no obvious cyanosis  MS: moves all visible extremities without noticeable abnormality  PSYCH/NEURO: pleasant and cooperative, no obvious depression or anxiety, speech and thought processing grossly intact  ASSESSMENT AND PLAN: Diarrhea of presumed infectious origin Assessment & Plan: Stool studies pending.  Empiric treatment for campylobacter, salmonella with Bactrim  (due to quinolone allergy and azithromycin  c/I due to use of amiodarone )   Other orders -     Sulfamethoxazole -Trimethoprim ; Take 1 tablet by mouth 2 (two) times daily.  Dispense: 10 tablet; Refill: 0      I discussed the assessment and treatment plan with the patient. The patient was provided an opportunity to ask questions and all were answered. The patient agreed with the plan and demonstrated an understanding of the instructions.   The patient was advised to call back or seek an in-person evaluation if the symptoms worsen or if the condition fails to improve as anticipated.   I spent 30 minutes dedicated to the care of this patient on the date of this encounter to include pre-visit review of his medical history,   Face-to-face time with the patient , and post visit ordering of testing and therapeutics.    Thersia Flax, MD

## 2023-05-11 NOTE — Progress Notes (Signed)
 Cardiac Individual Treatment Plan  Patient Details  Name: RONI STARR MRN: [id number] Date of Birth: 03-May-1955 Referring Provider:   Flowsheet Row Cardiac Rehab from 04/14/2023 in [facility] Cardiac and Pulmonary Rehab  Referring Provider Marilynn Shuck, MD       Initial Encounter Date:  Flowsheet Row Cardiac Rehab from 04/14/2023 in [facility] Cardiac and Pulmonary Rehab  Date 04/14/23       Visit Diagnosis: S/P AVR (aortic valve replacement)  Patient's Home Medications on Admission:  Current Outpatient Medications:    ALPRAZolam  (XANAX ) 0.25 MG tablet, Take 1 tablet (0.25 mg total) by mouth at bedtime as needed for anxiety. Or insomnia, Disp: 30 tablet, Rfl: 0   amiodarone  (PACERONE ) 200 MG tablet, Take 200 mg by mouth daily. Taking 2 tablets daily, Disp: , Rfl:    apixaban  (ELIQUIS ) 5 MG TABS tablet, Take 1 tablet (5 mg total) by mouth 2 (two) times daily., Disp: 60 tablet, Rfl: 5   atorvastatin  (LIPITOR) 80 MG tablet, Take 80 mg by mouth daily., Disp: , Rfl:    furosemide  (LASIX ) 40 MG tablet, Take 1 tablet (40 mg total) by mouth daily., Disp: 90 tablet, Rfl: 1   Melatonin 1 MG CAPS, Take by mouth., Disp: , Rfl:    metoprolol  tartrate (LOPRESSOR ) 25 MG tablet, Take 25 mg by mouth 2 (two) times daily., Disp: , Rfl:    ondansetron  (ZOFRAN -ODT) 4 MG disintegrating tablet, Take 1 tablet (4 mg total) by mouth every 8 (eight) hours as needed for nausea or vomiting., Disp: 20 tablet, Rfl: 0   pantoprazole  (PROTONIX ) 40 MG tablet, Take by mouth., Disp: , Rfl:    spironolactone  (ALDACTONE ) 25 MG tablet, Take 1 tablet by mouth daily., Disp: , Rfl:    traZODone  (DESYREL ) 50 MG tablet, Take 0.5-1 tablets (25-50 mg total) by mouth at bedtime as needed for sleep., Disp: 90 tablet, Rfl: 3  Past Medical History: Past Medical History:  Diagnosis Date   Acquired pes planus    Arthritis    Benign neoplasm of colon    History of left knee replacement    Hyperlipidemia    Hypertension    Obesity  (BMI 30-39.9)    with 70 lb weight loss, ongoing   Onychia and paronychia of toe    Sleep apnea, obstructive 04/26/2000   wears CPAP    Tobacco Use: Social History   Tobacco Use  Smoking Status Never  Smokeless Tobacco Never    Labs: Review Flowsheet  More data exists      Latest Ref Rng & Units 06/16/2018 05/25/2019 08/01/2020 03/10/2021 06/01/2022  Labs for ITP Cardiac and Pulmonary Rehab  Cholestrol <200 mg/dL - - 409  811  914   LDL (calc) mg/dL (calc) - - 782  86  78   Direct LDL mg/dL 956.2  - - - -  HDL-C > OR = 40 mg/dL - - 13.08  65.78  70   Trlycerides <150 mg/dL - - 469.6  29.5  92   Hemoglobin A1c 4.6 - 6.5 % 5.2  5.3  - 4.7  -     Exercise Target Goals: Exercise Program Goal: Individual exercise prescription set using results from initial 6 min walk test and THRR while considering  patient's activity barriers and safety.   Exercise Prescription Goal: Initial exercise prescription builds to 30-45 minutes a day of aerobic activity, 2-3 days per week.  Home exercise guidelines will be given to patient during program as part of exercise prescription that the participant  will acknowledge.   Education: Aerobic Exercise: - Group verbal and visual presentation on the components of exercise prescription. Introduces F.I.T.T principle from ACSM for exercise prescriptions.  Reviews F.I.T.T. principles of aerobic exercise including progression. Written material given at graduation.   Education: Resistance Exercise: - Group verbal and visual presentation on the components of exercise prescription. Introduces F.I.T.T principle from ACSM for exercise prescriptions  Reviews F.I.T.T. principles of resistance exercise including progression. Written material given at graduation.    Education: Exercise & Equipment Safety: - Individual verbal instruction and demonstration of equipment use and safety with use of the equipment. Flowsheet Row Cardiac Rehab from 04/14/2023 in [facility]  Cardiac and Pulmonary Rehab  Date 04/14/23  Educator MB  Instruction Review Code 1- Verbalizes Understanding       Education: Exercise Physiology & General Exercise Guidelines: - Group verbal and written instruction with models to review the exercise physiology of the cardiovascular system and associated critical values. Provides general exercise guidelines with specific guidelines to those with heart or lung disease.    Education: Flexibility, Balance, Mind/Body Relaxation: - Group verbal and visual presentation with interactive activity on the components of exercise prescription. Introduces F.I.T.T principle from ACSM for exercise prescriptions. Reviews F.I.T.T. principles of flexibility and balance exercise training including progression. Also discusses the mind body connection.  Reviews various relaxation techniques to help reduce and manage stress (i.e. Deep breathing, progressive muscle relaxation, and visualization). Balance handout provided to take home. Written material given at graduation.   Activity Barriers & Risk Stratification:  Activity Barriers & Cardiac Risk Stratification - 04/14/23 1124       Activity Barriers & Cardiac Risk Stratification   Activity Barriers Right Knee Replacement;Left Knee Replacement;Other (comment)    Comments R foot reconstruction, L shoulder replacement, lifting restriction    Cardiac Risk Stratification High             6 Minute Walk:  6 Minute Walk     Row Name 04/14/23 1122         6 Minute Walk   Phase Initial     Distance 930 feet     Walk Time 6 minutes     # of Rest Breaks 0     MPH 1.76     METS 1.8     RPE 11     Perceived Dyspnea  1     VO2 Peak 6.31     Symptoms No     Resting HR 96 bpm     Resting BP 100/78     Resting Oxygen Saturation  96 %     Exercise Oxygen Saturation  during 6 min walk 98 %     Max Ex. HR 101 bpm     Max Ex. BP 136/84     2 Minute Post BP 120/86              Oxygen Initial  Assessment:   Oxygen Re-Evaluation:   Oxygen Discharge (Final Oxygen Re-Evaluation):   Initial Exercise Prescription:  Initial Exercise Prescription - 04/14/23 1100       Date of Initial Exercise RX and Referring Provider   Date 04/14/23    Referring Provider Marilynn Shuck, MD      Oxygen   Maintain Oxygen Saturation 88% or higher      Treadmill   MPH 1.2    Grade 0    Minutes 15    METs 1.92      NuStep   Level 1  SPM 80    Minutes 15    METs 1.8      Biostep-RELP   Level 1    SPM 50    Minutes 15    METs 1.8      Track   Laps 17    Minutes 15    METs 1.92      Prescription Details   Frequency (times per week) 2    Duration Progress to 30 minutes of continuous aerobic without signs/symptoms of physical distress      Intensity   THRR 40-80% of Max Heartrate 119-143    Ratings of Perceived Exertion 11-13    Perceived Dyspnea 0-4      Progression   Progression Continue to progress workloads to maintain intensity without signs/symptoms of physical distress.      Resistance Training   Training Prescription Yes    Weight 3 lb    Reps 10-15             Perform Capillary Blood Glucose checks as needed.  Exercise Prescription Changes:   Exercise Prescription Changes     Row Name 04/14/23 1100 05/04/23 1100           Response to Exercise   Blood Pressure (Admit) 100/78 120/82      Blood Pressure (Exercise) 136/84 138/78      Blood Pressure (Exit) 120/86 122/72      Heart Rate (Admit) 96 bpm 102 bpm      Heart Rate (Exercise) 101 bpm 129 bpm      Heart Rate (Exit) 100 bpm 91 bpm      Oxygen Saturation (Admit) 96 % --      Oxygen Saturation (Exercise) 98 % --      Oxygen Saturation (Exit) 97 % --      Rating of Perceived Exertion (Exercise) 11 13      Perceived Dyspnea (Exercise) 1 --      Symptoms none none      Comments results First three days of exercise      Duration Progress to 30 minutes of  aerobic without signs/symptoms of  physical distress Progress to 30 minutes of  aerobic without signs/symptoms of physical distress      Intensity THRR New THRR unchanged        Progression   Progression Continue to progress workloads to maintain intensity without signs/symptoms of physical distress. Continue to progress workloads to maintain intensity without signs/symptoms of physical distress.      Average METs 1.8 2.83        Resistance Training   Training Prescription -- Yes      Weight -- 3 lb      Reps -- 10-15        Interval Training   Interval Training -- No        Treadmill   MPH -- 1.5      Grade -- 0      Minutes -- 15      METs -- 2.15        NuStep   Level -- 2      Minutes -- 15      METs -- 3.2        Biostep-RELP   Level -- 1      Minutes -- 15        Track   Laps -- 26      Minutes -- 15      METs -- 2.41  Oxygen   Maintain Oxygen Saturation -- 88% or higher               Exercise Comments:   Exercise Comments     Row Name 04/21/23 0932           Exercise Comments First full day of exercise!  Patient was oriented to gym and equipment including functions, settings, policies, and procedures.  Patient's individual exercise prescription and treatment plan were reviewed.  All starting workloads were established based on the results of the 6 minute walk test done at initial orientation visit.  The plan for exercise progression was also introduced and progression will be customized based on patient's performance and goals.                Exercise Goals and Review:   Exercise Goals     Row Name 04/14/23 1129             Exercise Goals   Increase Physical Activity Yes       Intervention Provide advice, education, support and counseling about physical activity/exercise needs.;Develop an individualized exercise prescription for aerobic and resistive training based on initial evaluation findings, risk stratification, comorbidities and participant's personal goals.        Expected Outcomes Short Term: Attend rehab on a regular basis to increase amount of physical activity.;Long Term: Add in home exercise to make exercise part of routine and to increase amount of physical activity.;Long Term: Exercising regularly at least 3-5 days a week.       Increase Strength and Stamina Yes       Intervention Provide advice, education, support and counseling about physical activity/exercise needs.;Develop an individualized exercise prescription for aerobic and resistive training based on initial evaluation findings, risk stratification, comorbidities and participant's personal goals.       Expected Outcomes Short Term: Increase workloads from initial exercise prescription for resistance, speed, and METs.;Short Term: Perform resistance training exercises routinely during rehab and add in resistance training at home;Long Term: Improve cardiorespiratory fitness, muscular endurance and strength as measured by increased METs and functional capacity ( )       Able to understand and use rate of perceived exertion (RPE) scale Yes       Intervention Provide education and explanation on how to use RPE scale       Expected Outcomes Short Term: Able to use RPE daily in rehab to express subjective intensity level;Long Term:  Able to use RPE to guide intensity level when exercising independently       Able to understand and use Dyspnea scale Yes       Intervention Provide education and explanation on how to use Dyspnea scale       Expected Outcomes Short Term: Able to use Dyspnea scale daily in rehab to express subjective sense of shortness of breath during exertion;Long Term: Able to use Dyspnea scale to guide intensity level when exercising independently       Knowledge and understanding of Target Heart Rate Range (THRR) Yes       Intervention Provide education and explanation of THRR including how the numbers were predicted and where they are located for reference       Expected Outcomes  Short Term: Able to state/look up THRR;Short Term: Able to use daily as guideline for intensity in rehab;Long Term: Able to use THRR to govern intensity when exercising independently       Able to check pulse independently Yes  Intervention Provide education and demonstration on how to check pulse in carotid and radial arteries.;Review the importance of being able to check your own pulse for safety during independent exercise       Expected Outcomes Short Term: Able to explain why pulse checking is important during independent exercise;Long Term: Able to check pulse independently and accurately       Understanding of Exercise Prescription Yes       Intervention Provide education, explanation, and written materials on patient's individual exercise prescription       Expected Outcomes Short Term: Able to explain program exercise prescription;Long Term: Able to explain home exercise prescription to exercise independently                Exercise Goals Re-Evaluation :  Exercise Goals Re-Evaluation     Row Name 04/21/23 0932 05/04/23 1134           Exercise Goal Re-Evaluation   Exercise Goals Review Able to understand and use rate of perceived exertion (RPE) scale;Able to understand and use Dyspnea scale;Knowledge and understanding of Target Heart Rate Range (THRR);Understanding of Exercise Prescription Increase Physical Activity;Increase Strength and Stamina;Understanding of Exercise Prescription      Comments Reviewed RPE and dyspnea scale, THR and program prescription with pt today.  Pt voiced understanding and was given a copy of goals to take home. Abrien is off to a good start in the program. He was able to increase his treadmill workload up to a speed of 1.5 mph with no incline, and walked up to 26 laps on the track. He also did well on the biostep at level 1 and the T4 nustep at level 2. We will continue to monitor his progress in the program.      Expected Outcomes Short: Use RPE  daily to regulate intensity. Long: Follow program prescription in THR. Short: Continue to follow current exercise prescription. Long: Continue exercise to improve strength and stamina.               Discharge Exercise Prescription (Final Exercise Prescription Changes):  Exercise Prescription Changes - 05/04/23 1100       Response to Exercise   Blood Pressure (Admit) 120/82    Blood Pressure (Exercise) 138/78    Blood Pressure (Exit) 122/72    Heart Rate (Admit) 102 bpm    Heart Rate (Exercise) 129 bpm    Heart Rate (Exit) 91 bpm    Rating of Perceived Exertion (Exercise) 13    Symptoms none    Comments First three days of exercise    Duration Progress to 30 minutes of  aerobic without signs/symptoms of physical distress    Intensity THRR unchanged      Progression   Progression Continue to progress workloads to maintain intensity without signs/symptoms of physical distress.    Average METs 2.83      Resistance Training   Training Prescription Yes    Weight 3 lb    Reps 10-15      Interval Training   Interval Training No      Treadmill   MPH 1.5    Grade 0    Minutes 15    METs 2.15      NuStep   Level 2    Minutes 15    METs 3.2      Biostep-RELP   Level 1    Minutes 15      Track   Laps 26    Minutes 15  METs 2.41      Oxygen   Maintain Oxygen Saturation 88% or higher             Nutrition:  Target Goals: Understanding of nutrition guidelines, daily intake of sodium 1500mg , cholesterol 200mg , calories 30% from fat and 7% or less from saturated fats, daily to have 5 or more servings of fruits and vegetables.  Education: All About Nutrition: -Group instruction provided by verbal, written material, interactive activities, discussions, models, and posters to present general guidelines for heart healthy nutrition including fat, fiber, MyPlate, the role of sodium in heart healthy nutrition, utilization of the nutrition label, and utilization of  this knowledge for meal planning. Follow up email sent as well. Written material given at graduation.   Biometrics:  Pre Biometrics - 04/14/23 1130       Pre Biometrics   Height 5\' 7"  (1.702 m)    Weight 260 lb 4.8 oz (118.1 kg)    Waist Circumference 48 inches    Hip Circumference 54 inches    Waist to Hip Ratio 0.89 %    BMI (Calculated) 40.76    Single Leg Stand 2 seconds              Nutrition Therapy Plan and Nutrition Goals:  Nutrition Therapy & Goals - 04/14/23 1131       Personal Nutrition Goals   Nutrition Goal Wants to schedule appointment in the coming weeks after discussing with family      Intervention Plan   Intervention Prescribe, educate and counsel regarding individualized specific dietary modifications aiming towards targeted core components such as weight, hypertension, lipid management, diabetes, heart failure and other comorbidities.;Nutrition handout(s) given to patient.    Expected Outcomes Short Term Goal: Understand basic principles of dietary content, such as calories, fat, sodium, cholesterol and nutrients.;Short Term Goal: A plan has been developed with personal nutrition goals set during dietitian appointment.;Long Term Goal: Adherence to prescribed nutrition plan.             Nutrition Assessments:  MEDIFICTS Score Key: >=70 Need to make dietary changes  40-70 Heart Healthy Diet <= 40 Therapeutic Level Cholesterol Diet   Picture Your Plate Scores: <96 Unhealthy dietary pattern with much room for improvement. 41-50 Dietary pattern unlikely to meet recommendations for good health and room for improvement. 51-60 More healthful dietary pattern, with some room for improvement.  >60 Healthy dietary pattern, although there may be some specific behaviors that could be improved.    Nutrition Goals Re-Evaluation:   Nutrition Goals Discharge (Final Nutrition Goals Re-Evaluation):   Psychosocial: Target Goals: Acknowledge presence or  absence of significant depression and/or stress, maximize coping skills, provide positive support system. Participant is able to verbalize types and ability to use techniques and skills needed for reducing stress and depression.   Education: Stress, Anxiety, and Depression - Group verbal and visual presentation to define topics covered.  Reviews how body is impacted by stress, anxiety, and depression.  Also discusses healthy ways to reduce stress and to treat/manage anxiety and depression.  Written material given at graduation.   Education: Sleep Hygiene -Provides group verbal and written instruction about how sleep can affect your health.  Define sleep hygiene, discuss sleep cycles and impact of sleep habits. Review good sleep hygiene tips.    Initial Review & Psychosocial Screening:  Initial Psych Review & Screening - 04/11/23 1123       Initial Review   Current issues with Current Sleep Concerns;Current Stress Concerns  Source of Stress Concerns Chronic Illness      Family Dynamics   Good Support System? Yes    Comments Jemarion sleeps with a CPAP but he has not been able to use it since he gets up alot and cant sleep enough to use it. He can look to his wife for support.      Barriers   Psychosocial barriers to participate in program The patient should benefit from training in stress management and relaxation.      Screening Interventions   Interventions Encouraged to exercise;To provide support and resources with identified psychosocial needs;Provide feedback about the scores to participant    Expected Outcomes Short Term goal: Utilizing psychosocial counselor, staff and physician to assist with identification of specific Stressors or current issues interfering with healing process. Setting desired goal for each stressor or current issue identified.;Long Term Goal: Stressors or current issues are controlled or eliminated.;Short Term goal: Identification and review with participant of  any Quality of Life or Depression concerns found by scoring the questionnaire.;Long Term goal: The participant improves quality of Life and PHQ9 Scores as seen by post scores and/or verbalization of changes             Quality of Life Scores:   Scores of 19 and below usually indicate a poorer quality of life in these areas.  A difference of  2-3 points is a clinically meaningful difference.  A difference of 2-3 points in the total score of the Quality of Life Index has been associated with significant improvement in overall quality of life, self-image, physical symptoms, and general health in studies assessing change in quality of life.  PHQ-9: Review Flowsheet       04/14/2023 03/15/2023 06/01/2022 10/17/2020  Depression screen PHQ 2/9  Decreased Interest 1 0 3 0  Down, Depressed, Hopeless 0 0 3 0  PHQ - 2 Score 1 0 6 0  Altered sleeping 2 - 3 3  Tired, decreased energy 1 - 3 3  Change in appetite 0 - 2 0  Feeling bad or failure about yourself  0 - 2 1  Trouble concentrating 1 - 3 1  Moving slowly or fidgety/restless 0 - 3 1  Suicidal thoughts 0 - 0 0  PHQ-9 Score 5 - 22 9  Difficult doing work/chores Somewhat difficult - Extremely dIfficult Somewhat difficult   Interpretation of Total Score  Total Score Depression Severity:  1-4 = Minimal depression, 5-9 = Mild depression, 10-14 = Moderate depression, 15-19 = Moderately severe depression, 20-27 = Severe depression   Psychosocial Evaluation and Intervention:  Psychosocial Evaluation - 04/11/23 1125       Psychosocial Evaluation & Interventions   Interventions Relaxation education;Stress management education;Encouraged to exercise with the program and follow exercise prescription    Comments Arshan sleeps with a CPAP but he has not been able to use it since he gets up alot and cant sleep enough to use it. He can look to his wife for support.    Expected Outcomes Short: Start HeartTrack to help with mood. Long: Maintain a healthy  mental state    Continue Psychosocial Services  Follow up required by staff             Psychosocial Re-Evaluation:   Psychosocial Discharge (Final Psychosocial Re-Evaluation):   Vocational Rehabilitation: Provide vocational rehab assistance to qualifying candidates.   Vocational Rehab Evaluation & Intervention:   Education: Education Goals: Education classes will be provided on a variety of topics geared toward better  understanding of heart health and risk factor modification. Participant will state understanding/return demonstration of topics presented as noted by education test scores.  Learning Barriers/Preferences:  Learning Barriers/Preferences - 04/11/23 1123       Learning Barriers/Preferences   Learning Barriers None    Learning Preferences None             General Cardiac Education Topics:  AED/CPR: - Group verbal and written instruction with the use of models to demonstrate the basic use of the AED with the basic ABC's of resuscitation.   Anatomy and Cardiac Procedures: - Group verbal and visual presentation and models provide information about basic cardiac anatomy and function. Reviews the testing methods done to diagnose heart disease and the outcomes of the test results. Describes the treatment choices: Medical Management, Angioplasty, or Coronary Bypass Surgery for treating various heart conditions including Myocardial Infarction, Angina, Valve Disease, and Cardiac Arrhythmias.  Written material given at graduation.   Medication Safety: - Group verbal and visual instruction to review commonly prescribed medications for heart and lung disease. Reviews the medication, class of the drug, and side effects. Includes the steps to properly store meds and maintain the prescription regimen.  Written material given at graduation.   Intimacy: - Group verbal instruction through game format to discuss how heart and lung disease can affect sexual intimacy.  Written material given at graduation..   Know Your Numbers and Heart Failure: - Group verbal and visual instruction to discuss disease risk factors for cardiac and pulmonary disease and treatment options.  Reviews associated critical values for Overweight/Obesity, Hypertension, Cholesterol, and Diabetes.  Discusses basics of heart failure: signs/symptoms and treatments.  Introduces Heart Failure Zone chart for action plan for heart failure.  Written material given at graduation.   Infection Prevention: - Provides verbal and written material to individual with discussion of infection control including proper hand washing and proper equipment cleaning during exercise session. Flowsheet Row Cardiac Rehab from 04/14/2023 in [facility] Cardiac and Pulmonary Rehab  Date 04/14/23  Educator MB  Instruction Review Code 1- Verbalizes Understanding       Falls Prevention: - Provides verbal and written material to individual with discussion of falls prevention and safety. Flowsheet Row Cardiac Rehab from 04/14/2023 in [facility] Cardiac and Pulmonary Rehab  Date 04/14/23  Educator MB  Instruction Review Code 1- Verbalizes Understanding       Other: -Provides group and verbal instruction on various topics (see comments)   Knowledge Questionnaire Score:   Core Components/Risk Factors/Patient Goals at Admission:  Personal Goals and Risk Factors at Admission - 04/14/23 1132       Core Components/Risk Factors/Patient Goals on Admission    Weight Management Yes;Weight Loss    Intervention Weight Management: Develop a combined nutrition and exercise program designed to reach desired caloric intake, while maintaining appropriate intake of nutrient and fiber, sodium and fats, and appropriate energy expenditure required for the weight goal.;Weight Management: Provide education and appropriate resources to help participant work on and attain dietary goals.;Weight Management/Obesity: Establish reasonable short  term and long term weight goals.    Admit Weight 260 lb 4.8 oz (118.1 kg)    Goal Weight: Short Term 255 lb (115.7 kg)    Goal Weight: Long Term 250 lb (113.4 kg)    Expected Outcomes Long Term: Adherence to nutrition and physical activity/exercise program aimed toward attainment of established weight goal;Short Term: Continue to assess and modify interventions until short term weight is achieved;Weight Loss: Understanding of general recommendations  for a balanced deficit meal plan, which promotes 1-2 lb weight loss per week and includes a negative energy balance of [phone] kcal/d;Understanding recommendations for meals to include 15-35% energy as protein, 25-35% energy from fat, 35-60% energy from carbohydrates, less than 200mg  of dietary cholesterol, 20-35 gm of total fiber daily;Understanding of distribution of calorie intake throughout the day with the consumption of 4-5 meals/snacks    Heart Failure Yes    Intervention Provide a combined exercise and nutrition program that is supplemented with education, support and counseling about heart failure. Directed toward relieving symptoms such as shortness of breath, decreased exercise tolerance, and extremity edema.    Expected Outcomes Improve functional capacity of life;Short term: Attendance in program 2-3 days a week with increased exercise capacity. Reported lower sodium intake. Reported increased fruit and vegetable intake. Reports medication compliance.;Short term: Daily weights obtained and reported for increase. Utilizing diuretic protocols set by physician.    Hypertension Yes    Intervention Provide education on lifestyle modifcations including regular physical activity/exercise, weight management, moderate sodium restriction and increased consumption of fresh fruit, vegetables, and low fat dairy, alcohol moderation, and smoking cessation.;Monitor prescription use compliance.    Expected Outcomes Short Term: Continued assessment and intervention  until BP is < 140/55mm HG in hypertensive participants. < 130/68mm HG in hypertensive participants with diabetes, heart failure or chronic kidney disease.;Long Term: Maintenance of blood pressure at goal levels.             Education:Diabetes - Individual verbal and written instruction to review signs/symptoms of diabetes, desired ranges of glucose level fasting, after meals and with exercise. Acknowledge that pre and post exercise glucose checks will be done for 3 sessions at entry of program.   Core Components/Risk Factors/Patient Goals Review:    Core Components/Risk Factors/Patient Goals at Discharge (Final Review):    ITP Comments:  ITP Comments     Row Name 04/11/23 1128 04/14/23 1122 04/21/23 0932 05/11/23 1452     ITP Comments Virtual Visit completed. Patient informed on EP and RD appointment and 6 Minute walk test. Patient also informed of patient health questionnaires on My Chart. Patient Verbalizes understanding. Visit diagnosis can be found in [facility] 02/01/[facility]23. Completed and gym orientation. Initial ITP created and sent for review to Dr. Firman Hughes, Medical Director. First full day of exercise!  Patient was oriented to gym and equipment including functions, settings, policies, and procedures.  Patient's individual exercise prescription and treatment plan were reviewed.  All starting workloads were established based on the results of the 6 minute walk test done at initial orientation visit.  The plan for exercise progression was also introduced and progression will be customized based on patient's performance and goals. 30 Day review completed. Medical Director ITP review done, changes made as directed, and signed approval by Medical Director.             Comments: 30 day review

## 2023-05-11 NOTE — Assessment & Plan Note (Signed)
 Stool studies pending.  Empiric treatment for campylobacter, salmonella with Bactrim  (due to quinolone allergy and azithromycin  c/I due to use of amiodarone )

## 2023-05-12 ENCOUNTER — Telehealth: Payer: Self-pay

## 2023-05-12 ENCOUNTER — Other Ambulatory Visit
Admission: RE | Admit: 2023-05-12 | Discharge: 2023-05-12 | Disposition: A | Discharge status: Home / Self Care | Payer: Medicare Other | Source: Ambulatory Visit | Attending: Internal Medicine | Admitting: Internal Medicine

## 2023-05-12 ENCOUNTER — Encounter: Payer: Medicare Other | Admitting: *Deleted

## 2023-05-12 ENCOUNTER — Encounter: Payer: Self-pay | Admitting: Family

## 2023-05-12 DIAGNOSIS — R197 Diarrhea, unspecified: Secondary | ICD-10-CM | POA: Insufficient documentation

## 2023-05-12 DIAGNOSIS — Z952 Presence of prosthetic heart valve: Secondary | ICD-10-CM

## 2023-05-12 LAB — GASTROINTESTINAL PANEL BY PCR, STOOL (REPLACES STOOL CULTURE)

## 2023-05-12 LAB — C DIFFICILE QUICK SCREEN W PCR REFLEX
C Diff antigen: NEGATIVE
C Diff interpretation: NOT DETECTED
C Diff toxin: NEGATIVE

## 2023-05-12 NOTE — Telephone Encounter (Signed)
FYI [facility] team  Called pt and left vm x 2 to discuss positive E. coli ( EPEC), norovirus.   Currently on Bactrim after virtual visit yesterday with Dr Darrick Huntsman.  I sent pt mychart message  See mychart message and review with patient  Advised ED if concern for dehydration for IV fluids, labs,  and re evaluation

## 2023-05-12 NOTE — Telephone Encounter (Signed)
Copied from CRM [id number]. Topic: Clinical - Lab/Test Results >> May 12, 2023  1:36 PM Truddie Crumble wrote: Reason for CRM: Angelica Chessman from [facility] called stating she has the pt lab results for the office. I contacted the office and Shanda Bumps told me just get [facility] information and she would have to call her back because she is unavailable CB [phone]

## 2023-05-12 NOTE — Telephone Encounter (Signed)
Please see positive stool results from [facility]: Positive for E.Coli & Norovirus Gi /GII  Fowarding to Doc of the day

## 2023-05-12 NOTE — Progress Notes (Signed)
Daily Session Note  Patient Details  Name: MOSSES MCDILL MRN: [id number] Date of Birth: [birth year] Referring Provider:   Flowsheet Row Cardiac Rehab from 04/14/2023 in [facility] Cardiac and Pulmonary Rehab  Referring Provider [name], MD       Encounter Date: 05/12/2023  Check In:  Session Check In - 05/12/23 0956       Check-In   Supervising physician immediately available to respond to emergencies See telemetry face sheet for immediately available ER MD    Location [facility]-Cardiac & Pulmonary Rehab    Staff Present Cora Collum, RN, BSN, CCRP;Margaret Best, MS, Exercise Physiologist;Meredith Jewel Baize RN,BSN;Joseph Reino Kent RCP,RRT,BSRT    Virtual Visit No    Medication changes reported     No    Fall or balance concerns reported    No    Warm-up and Cool-down Performed on first and last piece of equipment    Resistance Training Performed Yes    VAD Patient? No    PAD/SET Patient? No      Pain Assessment   Currently in Pain? No/denies                Social History   Tobacco Use  Smoking Status Never  Smokeless Tobacco Never    Goals Met:  Independence with exercise equipment Exercise tolerated well No report of concerns or symptoms today  Goals Unmet:  Not Applicable  Comments: Pt able to follow exercise prescription today without complaint.  Will continue to monitor for progression.    Dr. [name] is Medical Director for [facility] Cardiac Rehabilitation.  Dr. [name] is Medical Director for [facility] Pulmonary Rehabilitation.

## 2023-05-13 ENCOUNTER — Encounter: Payer: Self-pay | Admitting: Oncology

## 2023-05-13 NOTE — Telephone Encounter (Signed)
Copied from CRM [id number]. Topic: Clinical - Lab/Test Results >> May 12, 2023  5:30 PM Corin V wrote: Reason for CRM: Patient returned call to office for ab results/message from [employer]. Please call patient back to review clinical information at [phone]

## 2023-05-13 NOTE — Telephone Encounter (Signed)
Noted Dr Darrick Huntsman has spoken to pt. See telephone call

## 2023-05-13 NOTE — Telephone Encounter (Signed)
Noted  

## 2023-05-17 LAB — STOOL CULTURE: E coli, Shiga toxin Assay: NEGATIVE

## 2023-05-17 LAB — STOOL CULTURE REFLEX - RSASHR

## 2023-05-17 LAB — STOOL CULTURE REFLEX - CMPCXR

## 2023-05-19 ENCOUNTER — Encounter: Payer: Medicare Other | Admitting: *Deleted

## 2023-05-19 DIAGNOSIS — Z952 Presence of prosthetic heart valve: Secondary | ICD-10-CM | POA: Diagnosis not present

## 2023-05-19 NOTE — Progress Notes (Signed)
Daily Session Note  Patient Details  Name: John Wolf MRN: [id number] Date of Birth: [birth year] Referring Provider:   Flowsheet Row Cardiac Rehab from 04/14/2023 in [facility] Cardiac and Pulmonary Rehab  Referring Provider [name], MD       Encounter Date: 05/19/2023  Check In:  Session Check In - 05/19/23 0931       Check-In   Supervising physician immediately available to respond to emergencies See telemetry face sheet for immediately available ER MD    Location [facility]-Cardiac & Pulmonary Rehab    Staff Present [name], MS, Exercise Physiologist;Talasia Saulter, RN, BSN, CCRP;Jason Wallace Cullens RDN,LDN    Virtual Visit No    Medication changes reported     No    Fall or balance concerns reported    No    Warm-up and Cool-down Performed on first and last piece of equipment    Resistance Training Performed Yes    VAD Patient? No    PAD/SET Patient? No      Pain Assessment   Currently in Pain? No/denies                Social History   Tobacco Use  Smoking Status Never  Smokeless Tobacco Never    Goals Met:  Independence with exercise equipment Exercise tolerated well No report of concerns or symptoms today  Goals Unmet:  Not Applicable  Comments: Pt able to follow exercise prescription today without complaint.  Will continue to monitor for progression.    Dr. [name] is Medical Director for [facility] Cardiac Rehabilitation.  Dr. [name] is Medical Director for [facility] Pulmonary Rehabilitation.

## 2023-05-20 ENCOUNTER — Other Ambulatory Visit: Payer: Self-pay | Admitting: Internal Medicine

## 2023-05-20 MED ORDER — ATORVASTATIN CALCIUM 80 MG PO TABS: 80.0000 mg | ORAL_TABLET | Freq: Every day | ORAL | 1 refills | Status: DC

## 2023-05-20 MED ORDER — AMIODARONE HCL 200 MG PO TABS: 200.0000 mg | ORAL_TABLET | Freq: Every day | ORAL | 1 refills | Status: DC

## 2023-05-24 ENCOUNTER — Encounter: Payer: Medicare Other | Admitting: *Deleted

## 2023-05-24 DIAGNOSIS — Z952 Presence of prosthetic heart valve: Secondary | ICD-10-CM | POA: Diagnosis not present

## 2023-05-24 NOTE — Progress Notes (Signed)
Daily Session Note  Patient Details  Name: DEVONN GIAMPIETRO MRN: [id number] Date of Birth: 06/19/1955 Referring Provider:   Flowsheet Row Cardiac Rehab from 04/14/2023 in [facility] Cardiac and Pulmonary Rehab  Referring Provider [name], MD       Encounter Date: 05/24/2023  Check In:  Session Check In - 05/24/23 9562       Check-In   Supervising physician immediately available to respond to emergencies See telemetry face sheet for immediately available ER MD    Location [facility]-Cardiac & Pulmonary Rehab    Staff Present [name], MS, Exercise Physiologist;[name] BS, Exercise Physiologist;[name], BS, Exercise Physiologist;Martita Brumm, RN, BSN, CCRP    Virtual Visit No    Medication changes reported     No    Fall or balance concerns reported    No    Warm-up and Cool-down Performed on first and last piece of equipment    Resistance Training Performed Yes    VAD Patient? No    PAD/SET Patient? No      Pain Assessment   Currently in Pain? No/denies                Social History   Tobacco Use  Smoking Status Never  Smokeless Tobacco Never    Goals Met:  Independence with exercise equipment Exercise tolerated well No report of concerns or symptoms today  Goals Unmet:  Not Applicable  Comments: Pt able to follow exercise prescription today without complaint.  Will continue to monitor for progression.    Dr. [name] is Medical Director for [facility] Cardiac Rehabilitation.  Dr. [name] is Medical Director for [facility] Pulmonary Rehabilitation.

## 2023-05-26 ENCOUNTER — Encounter: Payer: Medicare Other | Admitting: *Deleted

## 2023-05-26 DIAGNOSIS — Z952 Presence of prosthetic heart valve: Secondary | ICD-10-CM | POA: Diagnosis not present

## 2023-05-26 NOTE — Progress Notes (Signed)
Daily Session Note  Patient Details  Name: John Wolf MRN: [id number] Date of Birth: 08-23-1955 Referring Provider:   Flowsheet Row Cardiac Rehab from 04/14/2023 in [facility] Cardiac and Pulmonary Rehab  Referring Provider [name], MD       Encounter Date: 05/26/2023  Check In:  Session Check In - 05/26/23 0934       Check-In   Supervising physician immediately available to respond to emergencies See telemetry face sheet for immediately available ER MD    Location [facility]-Cardiac & Pulmonary Rehab    Staff Present Cora Collum, RN, BSN, CCRP;[name] BS, Exercise Physiologist;Joseph Hood RCP,RRT,BSRT;[name], [place], Exercise Physiologist    Virtual Visit No    Medication changes reported     No    Fall or balance concerns reported    No    Warm-up and Cool-down Performed on first and last piece of equipment    Resistance Training Performed Yes    VAD Patient? No    PAD/SET Patient? No      Pain Assessment   Currently in Pain? No/denies                Social History   Tobacco Use  Smoking Status Never  Smokeless Tobacco Never    Goals Met:  Independence with exercise equipment Exercise tolerated well No report of concerns or symptoms today  Goals Unmet:  Not Applicable  Comments: Pt able to follow exercise prescription today without complaint.  Will continue to monitor for progression.    Dr. [name] is Medical Director for [facility] Cardiac Rehabilitation.  Dr. [name] is Medical Director for [facility] Pulmonary Rehabilitation.

## 2023-05-27 ENCOUNTER — Other Ambulatory Visit: Payer: Self-pay | Admitting: Internal Medicine

## 2023-05-27 MED ORDER — SPIRONOLACTONE 25 MG PO TABS: 25.0000 mg | ORAL_TABLET | Freq: Every day | ORAL | 1 refills | Status: DC

## 2023-05-27 MED ORDER — METOPROLOL TARTRATE 25 MG PO TABS: 25.0000 mg | ORAL_TABLET | Freq: Two times a day (BID) | ORAL | 1 refills | Status: DC

## 2023-05-31 ENCOUNTER — Encounter: Payer: Medicare Other | Attending: Family Medicine | Admitting: *Deleted

## 2023-05-31 DIAGNOSIS — Z952 Presence of prosthetic heart valve: Secondary | ICD-10-CM

## 2023-05-31 NOTE — Progress Notes (Signed)
Daily Session Note  Patient Details  Name: John Wolf MRN: [id number] Date of Birth: [birth year] Referring Provider:   Flowsheet Row Cardiac Rehab from 04/14/2023 in [facility] Cardiac and Pulmonary Rehab  Referring Provider [name], MD       Encounter Date: 05/31/2023  Check In:  Session Check In - 05/31/23 4098       Check-In   Supervising physician immediately available to respond to emergencies See telemetry face sheet for immediately available ER MD    Location [facility]-Cardiac & Pulmonary Rehab    Staff Present [name], MS, Exercise Physiologist;Kriste Broman, RN, BSN, CCRP;[name] BS, Exercise Physiologist;[name], BS, Exercise Physiologist    Virtual Visit No    Medication changes reported     No    Fall or balance concerns reported    No    Warm-up and Cool-down Performed on first and last piece of equipment    Resistance Training Performed Yes    VAD Patient? No    PAD/SET Patient? No      Pain Assessment   Currently in Pain? No/denies                Social History   Tobacco Use  Smoking Status Never  Smokeless Tobacco Never    Goals Met:  Independence with exercise equipment Exercise tolerated well No report of concerns or symptoms today  Goals Unmet:  Not Applicable  Comments: Pt able to follow exercise prescription today without complaint.  Will continue to monitor for progression.    Dr. [name] is Medical Director for [facility] Cardiac Rehabilitation.  Dr. [name] is Medical Director for [facility] Pulmonary Rehabilitation.

## 2023-05-31 NOTE — Progress Notes (Signed)
Daily Session Note  Patient Details  Name: John Wolf MRN: [id number] Date of Birth: Apr 11, 1956 Referring Provider:   Flowsheet Row Cardiac Rehab from 04/14/2023 in [facility] Cardiac and Pulmonary Rehab  Referring Provider [name], MD       Encounter Date: 05/31/2023  Check In:  Session Check In - 05/31/23 1048       Check-In   Supervising physician immediately available to respond to emergencies See telemetry face sheet for immediately available ER MD    Location [facility]-Cardiac & Pulmonary Rehab    Staff Present Cora Collum, RN, BSN, CCRP;Meredith Jewel Baize RN,BSN;[name], BS, Exercise Physiologist;[name] BS, Exercise Physiologist;Margaret Best, MS, Exercise Physiologist    Virtual Visit No    Medication changes reported     No    Fall or balance concerns reported    No    Warm-up and Cool-down Performed on first and last piece of equipment    Resistance Training Performed No    VAD Patient? No    PAD/SET Patient? No      Pain Assessment   Currently in Pain? No/denies                Social History   Tobacco Use  Smoking Status Never  Smokeless Tobacco Never    Goals Met:  Independence with exercise equipment Exercise tolerated well No report of concerns or symptoms today  Goals Unmet:  Not Applicable  Comments: Pt able to follow exercise prescription today without complaint.  Will continue to monitor for progression.    Dr. [name] is Medical Director for [facility] Cardiac Rehabilitation.  Dr. [name] is Medical Director for [facility] Pulmonary Rehabilitation.

## 2023-05-31 NOTE — Progress Notes (Signed)
 Assessment start time: 10:18 AM  Digestive issues/concerns: no known food allergies  24-hours Recall: B: pita bread, cheese, eggs, strawberries, decaf coffee L: nothing D: chicken, salad, sweet potato  Beverages water , decaf coffee Alcohol couple drinks most days Caffeine none  Education r/t nutrition plan Patient not drinking much water , but drinks decaf coffee, milk, alcoholic drinks. Discussed cutting back on some of these beverages to fit more water  in. He agrees it is a good goal and will try to increase his water  intake. He reports he doesn't eat fried foods or salt his foods either. Commended him on this, reviewed a few facts labels and educated that most sodium in americans diets come from food already high in sodium rather than salting at table. Set goal to reviewed facts labels for foods he eats often and make sure he is eating less than 1500mg  sodium per day. He rarely eats lunch. Discussed the importance of meeting nutritional goals and how eating only 2 meals per day can make meeting these goals difficult. Set goal to use nutrient and protein rich foods as snacks during missed meals. Reviewed mediterranean diet handout, educated on types of fats, sources, and how to read labels.    Goal 1: Drink more water , ~32-64oz per day, less alcohol and milk Goal 2: Read labels and reduce sodium intake to below 2300mg . Ideally 1500mg  per day.  Goal 3: Eat 15-30gProtein and 30-60gCarbs at each meal hour  End time 10:57 AM

## 2023-06-02 ENCOUNTER — Ambulatory Visit (INDEPENDENT_AMBULATORY_CARE_PROVIDER_SITE_OTHER): Payer: Medicare Other | Admitting: Internal Medicine

## 2023-06-02 ENCOUNTER — Encounter: Payer: Medicare Other | Admitting: *Deleted

## 2023-06-02 ENCOUNTER — Encounter: Payer: Self-pay | Admitting: Oncology

## 2023-06-02 VITALS — BP 108/66 | HR 74 | Temp 97.9°F | Resp 16 | Ht 67.0 in | Wt 254.0 lb

## 2023-06-02 DIAGNOSIS — Z952 Presence of prosthetic heart valve: Secondary | ICD-10-CM

## 2023-06-02 DIAGNOSIS — D62 Acute posthemorrhagic anemia: Secondary | ICD-10-CM | POA: Diagnosis not present

## 2023-06-02 DIAGNOSIS — R197 Diarrhea, unspecified: Secondary | ICD-10-CM | POA: Diagnosis not present

## 2023-06-02 DIAGNOSIS — I10 Essential (primary) hypertension: Secondary | ICD-10-CM

## 2023-06-02 DIAGNOSIS — I420 Dilated cardiomyopathy: Secondary | ICD-10-CM

## 2023-06-02 DIAGNOSIS — I480 Paroxysmal atrial fibrillation: Secondary | ICD-10-CM | POA: Diagnosis not present

## 2023-06-02 DIAGNOSIS — J9859 Other diseases of mediastinum, not elsewhere classified: Secondary | ICD-10-CM

## 2023-06-02 MED ORDER — ALPRAZOLAM 0.25 MG PO TABS: 0.2500 mg | ORAL_TABLET | Freq: Every evening | ORAL | 0 refills | Status: AC | PRN

## 2023-06-02 NOTE — Patient Instructions (Addendum)
 THE FLUID COLLECTIN IS NOT SERIOUS AND MAY SHRINK OVER TIME WITH COLD COMPRESSES   I CAN ORDER AN ULTRASOUND TO DETERMINE IF IT'S FLUID OR AN INCISIONAL HERNIA      Try TAKING the ALPRAZOLAM  AT NIGHT ABOUT 15 MINUTES  BEFORE YOU PLACE THE CPAP ON YOUR FACE.  IT WILL HELP YOU RELAX    THINGS TO ASK THE SUPPLIER OF THE CPAP MACHINE?  1) IS IT AUTO TITRATING?   2) HOW DO I DOWNLOAD THE COMPLIANCE DATA?

## 2023-06-02 NOTE — Progress Notes (Signed)
 Subjective:  Patient ID: John Wolf, male    DOB: 03-14-1956  Age: 68 y.o. MRN: [id number]  CC: The primary encounter diagnosis was PAF (paroxysmal atrial fibrillation) (HCC). Diagnoses of Acute blood loss anemia, Essential hypertension, Diarrhea of presumed infectious origin, Dilated cardiomyopathy (HCC), S/P aortic valve replacement and aortoplasty, and Mediastinal abnormality were also pertinent to this visit.   HPI HEVER CASTILLEJA presents for  Chief Complaint  Patient presents with   Medical Management of Chronic Issues   John Wolf is a 68 yr old male with A COMPLICATED HISTORY WHICH INCLUDES AORTIC ANEURYSM REPAIR, AORTIC VALVE REPLACEMENT AND WATCHMAN PROCEDURE  ON February 01 2022 WHO HAS DEVELOPED A PAINLESS FLUID COLLECTION UNDER THE INFERIOR ORDER OF HIS MEDIASTINAL INCISION .  THE AREA IS NOT TENDER , RED OR WARM. PER HIS LIFE PARTNER IT HAS INCREASED IN SIZE OVER THE LAST 24 TO 48 HOURS .  HE UNDERWENT A TT ECHO ON MONDAY AT [facility]  AND CALLED IT TO THE ATTENTION OF THE ULTRASOUND TECH , BUT NOT TO HIS CARDIOTHORACIC SURGEON.  THERE IS NO MENTION OF THE FLUID COLLECTION ON THE ECHO.  HE HAS BEEN PARTICIPATING IN CARDIOPULMONARY REHAB WHICH HAS INCLUDED USE OF A RECUMBENT BIKE WITH UPPER BODY EXERCISES AS WELL.  HE DENIES CHEST WALL PAIN.   HIS POST OPERATIVE PERIOD HAS BEEN EVENTFUL AND COMPLICATED BY SEVERE ANEMIA DUE TO UPPER GI BLEED REQUIRING TRANSFUSIONS FOR HGB OF 4. HIS MOST RECENT HGB WAS 12.7 ON DC 30  ATRIAL FIB:  HE UNDERWENT  SCHEDULED CARDIOVERSION ON JAN 21 WITH PLANS TO ABLATE IN THE FUTURE(PER REVIEW OF CARDIOLOGY NOTE FROM JAN 5 VIA EPIC) IF HE HAS CONSIDERABLE IMPROVEMENT IN FATIGUE WITH SINUS RHYTHM    Outpatient Medications Prior to Visit  Medication Sig Dispense Refill   amiodarone  (PACERONE ) 200 MG tablet Take 1 tablet (200 mg total) by mouth daily. 90 tablet 1   apixaban  (ELIQUIS ) 5 MG TABS tablet Take 1 tablet (5 mg total) by mouth 2 (two) times daily.  60 tablet 5   atorvastatin  (LIPITOR) 80 MG tablet Take 1 tablet (80 mg total) by mouth daily. 90 tablet 1   furosemide  (LASIX ) 40 MG tablet Take 1 tablet (40 mg total) by mouth daily. 90 tablet 1   Melatonin 1 MG CAPS Take by mouth.     metoprolol  tartrate (LOPRESSOR ) 25 MG tablet Take 1 tablet (25 mg total) by mouth 2 (two) times daily. 180 tablet 1   ondansetron  (ZOFRAN -ODT) 4 MG disintegrating tablet Take 1 tablet (4 mg total) by mouth every 8 (eight) hours as needed for nausea or vomiting. 20 tablet 0   pantoprazole  (PROTONIX ) 40 MG tablet Take by mouth.     spironolactone  (ALDACTONE ) 25 MG tablet Take 1 tablet (25 mg total) by mouth daily. 90 tablet 1   sulfamethoxazole -trimethoprim  (BACTRIM  DS) 800-160 MG tablet Take 1 tablet by mouth 2 (two) times daily. 10 tablet 0   traZODone  (DESYREL ) 50 MG tablet Take 0.5-1 tablets (25-50 mg total) by mouth at bedtime as needed for sleep. 90 tablet 3   ALPRAZolam  (XANAX ) 0.25 MG tablet Take 1 tablet (0.25 mg total) by mouth at bedtime as needed for anxiety. Or insomnia 30 tablet 0   No facility-administered medications prior to visit.    Review of Systems;  Patient denies headache, fevers, malaise, unintentional weight loss, skin rash, eye pain, sinus congestion and sinus pain, sore throat, dysphagia,  hemoptysis , cough, dyspnea, wheezing, chest pain, palpitations, orthopnea,  edema, abdominal pain, nausea, melena, diarrhea, constipation, flank pain, dysuria, hematuria, urinary  Frequency, nocturia, numbness, tingling, seizures,  Focal weakness, Loss of consciousness,  Tremor, insomnia, depression, anxiety, and suicidal ideation.      Objective:  BP 108/66   Pulse 74   Temp 97.9 F (36.6 C)   Resp 16   Ht 5' 7 (1.702 m)   Wt 254 lb (115.2 kg)   SpO2 98%   BMI 39.78 kg/m   BP Readings from Last 3 Encounters:  06/02/23 108/66  05/11/23 112/86  04/14/23 106/70    Wt Readings from Last 3 Encounters:  06/02/23 254 lb (115.2 kg)   05/11/23 251 lb (113.9 kg)  04/14/23 260 lb 6.4 oz (118.1 kg)    Physical Exam Vitals reviewed.  Constitutional:      General: He is not in acute distress.    Appearance: Normal appearance. He is normal weight. He is not ill-appearing, toxic-appearing or diaphoretic.  HENT:     Head: Normocephalic.  Eyes:     General: No scleral icterus.       Right eye: No discharge.        Left eye: No discharge.     Conjunctiva/sclera: Conjunctivae normal.  Cardiovascular:     Rate and Rhythm: Normal rate and regular rhythm.     Heart sounds: Normal heart sounds.  Pulmonary:     Effort: Pulmonary effort is normal. No respiratory distress.     Breath sounds: Normal breath sounds.  Chest:       Comments: MIDLINE SYMMETRIC SWELLING , WITHOUT ERYTHEMA, WARMTH AND TENDERNESS  Musculoskeletal:        General: Normal range of motion.     Cervical back: Normal range of motion.  Skin:    General: Skin is warm and dry.  Neurological:     General: No focal deficit present.     Mental Status: He is alert and oriented to person, place, and time. Mental status is at baseline.  Psychiatric:        Mood and Affect: Mood normal.        Behavior: Behavior normal.        Thought Content: Thought content normal.        Judgment: Judgment normal.    Lab Results  Component Value Date   HGBA1C 4.7 03/10/2021   HGBA1C 5.3 05/25/2019   HGBA1C 5.2 06/16/2018    Lab Results  Component Value Date   CREATININE 0.94 04/25/2023   CREATININE 0.85 03/11/2023   CREATININE 0.65 02/18/2023    Lab Results  Component Value Date   WBC 3.7 (L) 04/25/2023   HGB 12.7 (L) 04/25/2023   HCT 38.6 (L) 04/25/2023   PLT 193.0 04/25/2023   GLUCOSE 95 04/25/2023   CHOL 166 06/01/2022   TRIG 92 06/01/2022   HDL 70 06/01/2022   LDLDIRECT 106.0 06/16/2018   LDLCALC 78 06/01/2022   ALT 32 02/12/2023   AST 27 02/12/2023   NA 142 04/25/2023   K 4.2 04/25/2023   CL 103 04/25/2023   CREATININE 0.94 04/25/2023    BUN 24 (H) 04/25/2023   CO2 31 04/25/2023   TSH 0.86 03/10/2021   PSA 0.86 06/01/2022   HGBA1C 4.7 03/10/2021   MICROALBUR 0.8 06/01/2022    No results found.  Assessment & Plan:  .PAF (paroxysmal atrial fibrillation) (HCC) Assessment & Plan: S/P CARDIOVERSION JAN 21 AT [facility].  PER JAN 5 CARDIOLOGY NOTES : He will continue with Eliquis , which he appears to  be tolerating, but this needs to be monitored closely due to his history of GI bleed. - His left atrial appendage was ligated, and he is not missing any doses of Eliquis , so a TEE is unnecessary.  -  CHECKING CBC TODAY -  ENCOURAGED TO  INCREASE COMPLIANCE WITH CPAP USE .  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 I NoCTOBER     He received 4 units of PRBCs at [facility] prior to transfer to [facility],  followed by at least one while at [facility].  He has been receiving IV iron   at [facility] and hemoglobin has improved as of last CBC done here. Repeating today since eliquis  has resumed    Lab Results  Component Value Date   WBC 3.7 (L) 04/25/2023   HGB 12.7 (L) 04/25/2023   HCT 38.6 (L) 04/25/2023   MCV 85.5 04/25/2023   PLT 193.0 04/25/2023   Lab Results  Component Value Date   IRON  40 (L) 02/18/2023   TIBC 293 02/18/2023       Orders: -     CBC with Differential/Platelet  Essential hypertension -     Comprehensive metabolic panel  Diarrhea of presumed infectious origin Assessment & Plan: Stool studies pendingOTED E COLI AND NOROVIRUS he completed .  Empiric treatment  with Bactrim  (due to quinolone allergy and azithromycin  c/I due to use of amiodarone )   Dilated cardiomyopathy (HCC) Assessment & Plan: Reviewed  [facility] FEB 2025 ECHO: EF 50% , LV HYP severely dilated LA. NO PERICARDIAL EFFUSION    S/P aortic valve replacement and  aortoplasty  Mediastinal abnormality Assessment & Plan: SEROMA VS INCISIONAL HERNIA.  ULTASOUND OFFERED BUT DEFERRED.  ADVISED TO APPLY COLD COMPRESSES  FOR 15 MINUTES EVERY FEW HOURS ,  TRY WEARING A COMPRESSIVE UNDERSHIRT AND CALL FOR IMAGING STUDY IF AREA ENLARGES    Other orders -     ALPRAZolam ; Take 1 tablet (0.25 mg total) by mouth at bedtime as needed for anxiety. Or insomnia  Dispense: 30 tablet; Refill: 0     I provided 40 minutes of face-to-face time during this encounter reviewing patient's last visit with me, patient's  most recent visit with cardiopulmonary rehab, cardiology,  cardiothoracic surgery ,  previous  labs and imaging studies, counseling on currently addressed issues,  and post visit ordering to diagnostics and therapeutics .   Follow-up: No follow-ups on file.   Verneita LITTIE Kettering, MD

## 2023-06-02 NOTE — Assessment & Plan Note (Addendum)
 S/P CARDIOVERSION JAN 21 AT [facility].  PER JAN 5 CARDIOLOGY NOTES : He will continue with Eliquis , which he appears to be tolerating, but this needs to be monitored closely due to his history of GI bleed. - His left atrial appendage was ligated, and he is not missing any doses of Eliquis , so a TEE is unnecessary.  -  CHECKING CBC TODAY -  ENCOURAGED TO  INCREASE COMPLIANCE WITH CPAP USE .  ALPRAZOLAM  GIVEN TODAY TO MANAGE CLAUSTROPHOBIC SYMPTOMS BROUGHT ON BY USE OF CPAP  ]

## 2023-06-02 NOTE — Assessment & Plan Note (Signed)
 SEROMA VS INCISIONAL HERNIA.  ULTASOUND OFFERED BUT DEFERRED.  ADVISED TO APPLY COLD COMPRESSES  FOR 15 MINUTES EVERY FEW HOURS ,  TRY WEARING A COMPRESSIVE UNDERSHIRT AND CALL FOR IMAGING STUDY IF AREA ENLARGES

## 2023-06-02 NOTE — Assessment & Plan Note (Signed)
 Reviewed  [facility] FEB 2025 ECHO: EF 50% , LV HYP severely dilated LA. NO PERICARDIAL EFFUSION

## 2023-06-02 NOTE — Progress Notes (Signed)
 Daily Session Note  Patient Details  Name: John Wolf MRN: [id number] Date of Birth: 1955/04/27 Referring Provider:   Flowsheet Row Cardiac Rehab from 04/14/2023 in [facility] Cardiac and Pulmonary Rehab  Referring Provider Sedalia Bring, MD       Encounter Date: 06/02/2023  Check In:  Session Check In - 06/02/23 0935       Check-In   Supervising physician immediately available to respond to emergencies See telemetry face sheet for immediately available ER MD    Location [facility]-Cardiac & Pulmonary Rehab    Staff Present Othel Durand, RN, BSN, CCRP;[name] BS, Exercise Physiologist;Joseph Hood RCP,RRT,BSRT;[name], [place], Exercise Physiologist    Virtual Visit No    Medication changes reported     No    Fall or balance concerns reported    No    Warm-up and Cool-down Performed on first and last piece of equipment    Resistance Training Performed Yes    VAD Patient? No    PAD/SET Patient? No      Pain Assessment   Currently in Pain? No/denies                Social History   Tobacco Use  Smoking Status Never  Smokeless Tobacco Never    Goals Met:  Proper associated with RPD/PD & O2 Sat Independence with exercise equipment Exercise tolerated well No report of concerns or symptoms today  Goals Unmet:  Not Applicable  Comments: Pt able to follow exercise prescription today without complaint.  Will continue to monitor for progression.    Dr. Oneil Pinal is Medical Director for [facility] Cardiac Rehabilitation.  Dr. Fuad Aleskerov is Medical Director for [facility] Pulmonary Rehabilitation.

## 2023-06-02 NOTE — Assessment & Plan Note (Signed)
 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 I NoCTOBER     He received 4 units of PRBCs at [facility] prior to transfer to [facility],  followed by at least one while at [facility].  He has been receiving IV iron   at [facility] and hemoglobin has improved as of last CBC done here. Repeating today since eliquis  has resumed    Lab Results  Component Value Date   WBC 3.7 (L) 04/25/2023   HGB 12.7 (L) 04/25/2023   HCT 38.6 (L) 04/25/2023   MCV 85.5 04/25/2023   PLT 193.0 04/25/2023   Lab Results  Component Value Date   IRON  40 (L) 02/18/2023   TIBC 293 02/18/2023

## 2023-06-02 NOTE — Assessment & Plan Note (Signed)
 Stool studies pendingOTED E COLI AND NOROVIRUS he completed .  Empiric treatment  with Bactrim  (due to quinolone allergy and azithromycin  c/I due to use of amiodarone )

## 2023-06-03 LAB — COMPREHENSIVE METABOLIC PANEL
ALT: 25 U/L (ref 0–53)
AST: 25 U/L (ref 0–37)
Albumin: 4.4 g/dL (ref 3.5–5.2)
Alkaline Phosphatase: 80 U/L (ref 39–117)
BUN: 27 mg/dL — ABNORMAL HIGH (ref 6–23)
CO2: 26 meq/L (ref 19–32)
Calcium: 9.4 mg/dL (ref 8.4–10.5)
Chloride: 102 meq/L (ref 96–112)
Creatinine, Ser: 1.04 mg/dL (ref 0.40–1.50)
GFR: 75.43 mL/min (ref >60.00)
Glucose, Bld: 99 mg/dL (ref 70–99)
Potassium: 4.3 meq/L (ref 3.5–5.1)
Sodium: 141 meq/L (ref 135–145)
Total Bilirubin: 0.8 mg/dL (ref 0.2–1.2)
Total Protein: 7.3 g/dL (ref 6.0–8.3)

## 2023-06-03 LAB — CBC WITH DIFFERENTIAL/PLATELET
Basophils Absolute: 0 10*3/uL (ref 0.0–0.1)
Basophils Relative: 0.8 % (ref 0.0–3.0)
Eosinophils Absolute: 0.2 10*3/uL (ref 0.0–0.7)
Eosinophils Relative: 2.9 % (ref 0.0–5.0)
HCT: 43.4 % (ref 39.0–52.0)
Hemoglobin: 14.3 g/dL (ref 13.0–17.0)
Lymphocytes Relative: 16 % (ref 12.0–46.0)
Lymphs Abs: 0.9 10*3/uL (ref 0.7–4.0)
MCHC: 32.9 g/dL (ref 30.0–36.0)
MCV: 86 fL (ref 78.0–100.0)
Monocytes Absolute: 0.6 10*3/uL (ref 0.1–1.0)
Monocytes Relative: 10.8 % (ref 3.0–12.0)
Neutro Abs: 4 10*3/uL (ref 1.4–7.7)
Neutrophils Relative %: 69.5 % (ref 43.0–77.0)
Platelets: 211 10*3/uL (ref 150.0–400.0)
RBC: 5.04 Mil/uL (ref 4.22–5.81)
RDW: 20.1 % — ABNORMAL HIGH (ref 11.5–15.5)
WBC: 5.8 10*3/uL (ref 4.0–10.5)

## 2023-06-05 ENCOUNTER — Encounter: Payer: Self-pay | Admitting: Internal Medicine

## 2023-06-07 ENCOUNTER — Encounter: Payer: Medicare Other | Admitting: *Deleted

## 2023-06-07 DIAGNOSIS — Z952 Presence of prosthetic heart valve: Secondary | ICD-10-CM

## 2023-06-07 NOTE — Progress Notes (Signed)
Daily Session Note  Patient Details  Name: John Wolf MRN: [id number] Date of Birth: 11-23-1955 Referring Provider:   Flowsheet Row Cardiac Rehab from 04/14/2023 in [facility] Cardiac and Pulmonary Rehab  Referring Provider [name], MD       Encounter Date: 06/07/2023  Check In:  Session Check In - 06/07/23 0923       Check-In   Supervising physician immediately available to respond to emergencies See telemetry face sheet for immediately available ER MD    Location [facility]-Cardiac & Pulmonary Rehab    Staff Present Cora Collum, RN, BSN, CCRP;Margaret Best, MS, Exercise Physiologist;[name] BS, Exercise Physiologist;[name], BS, Exercise Physiologist    Virtual Visit No    Medication changes reported     No    Fall or balance concerns reported    No    Warm-up and Cool-down Performed on first and last piece of equipment    Resistance Training Performed Yes    VAD Patient? No    PAD/SET Patient? No      Pain Assessment   Currently in Pain? No/denies                Social History   Tobacco Use  Smoking Status Never  Smokeless Tobacco Never    Goals Met:  Proper associated with RPD/PD & O2 Sat Independence with exercise equipment Exercise tolerated well No report of concerns or symptoms today  Goals Unmet:  Not Applicable  Comments: Pt able to follow exercise prescription today without complaint.  Will continue to monitor for progression.    Dr. [name] is Medical Director for [facility] Cardiac Rehabilitation.  Dr. [name] is Medical Director for [facility] Pulmonary Rehabilitation.

## 2023-06-08 DIAGNOSIS — Z952 Presence of prosthetic heart valve: Secondary | ICD-10-CM

## 2023-06-08 NOTE — Progress Notes (Signed)
Cardiac Individual Treatment Plan  Patient Details  Name: John Wolf MRN: [id number] Date of Birth: Jun 20, 1955 Referring Provider:   Flowsheet Row Cardiac Rehab from 04/14/2023 in [facility] Cardiac and Pulmonary Rehab  Referring Provider [name], MD       Initial Encounter Date:  Flowsheet Row Cardiac Rehab from 04/14/2023 in [facility] Cardiac and Pulmonary Rehab  Date 04/14/23       Visit Diagnosis: S/P AVR (aortic valve replacement)  Patient's Home Medications on Admission:  Current Outpatient Medications:    ALPRAZolam (XANAX) 0.25 MG tablet, Take 1 tablet (0.25 mg total) by mouth at bedtime as needed for anxiety. Or insomnia, Disp: 30 tablet, Rfl: 0   amiodarone (PACERONE) 200 MG tablet, Take 1 tablet (200 mg total) by mouth daily., Disp: 90 tablet, Rfl: 1   apixaban (ELIQUIS) 5 MG TABS tablet, Take 1 tablet (5 mg total) by mouth 2 (two) times daily., Disp: 60 tablet, Rfl: 5   atorvastatin (LIPITOR) 80 MG tablet, Take 1 tablet (80 mg total) by mouth daily., Disp: 90 tablet, Rfl: 1   furosemide (LASIX) 40 MG tablet, Take 1 tablet (40 mg total) by mouth daily., Disp: 90 tablet, Rfl: 1   Melatonin 1 MG CAPS, Take by mouth., Disp: , Rfl:    metoprolol tartrate (LOPRESSOR) 25 MG tablet, Take 1 tablet (25 mg total) by mouth 2 (two) times daily., Disp: 180 tablet, Rfl: 1   ondansetron (ZOFRAN-ODT) 4 MG disintegrating tablet, Take 1 tablet (4 mg total) by mouth every 8 (eight) hours as needed for nausea or vomiting., Disp: 20 tablet, Rfl: 0   pantoprazole (PROTONIX) 40 MG tablet, Take by mouth., Disp: , Rfl:    spironolactone (ALDACTONE) 25 MG tablet, Take 1 tablet (25 mg total) by mouth daily., Disp: 90 tablet, Rfl: 1   sulfamethoxazole-trimethoprim (BACTRIM DS) 800-160 MG tablet, Take 1 tablet by mouth 2 (two) times daily., Disp: 10 tablet, Rfl: 0   traZODone (DESYREL) 50 MG tablet, Take 0.5-1 tablets (25-50 mg total) by mouth at bedtime as needed for sleep., Disp: 90 tablet, Rfl:  3  Past Medical History: Past Medical History:  Diagnosis Date   Acquired pes planus    Arthritis    Benign neoplasm of colon    History of left knee replacement    Hyperlipidemia    Hypertension    Obesity (BMI 30-39.9)    with 70 lb weight loss, ongoing   Onychia and paronychia of toe    Sleep apnea, obstructive 04/26/2000   wears CPAP    Tobacco Use: Social History   Tobacco Use  Smoking Status Never  Smokeless Tobacco Never    Labs: Review Flowsheet  More data exists      Latest Ref Rng & Units 06/16/2018 05/25/2019 08/01/2020 03/10/2021 06/01/2022  Labs for ITP Cardiac and Pulmonary Rehab  Cholestrol <200 mg/dL - - 629  528  413   LDL (calc) mg/dL (calc) - - 244  86  78   Direct LDL mg/dL 010.2  - - - -  HDL-C > OR = 40 mg/dL - - 72.53  66.44  70   Trlycerides <150 mg/dL - - 034.7  42.5  92   Hemoglobin A1c 4.6 - 6.5 % 5.2  5.3  - 4.7  -     Exercise Target Goals: Exercise Program Goal: Individual exercise prescription set using results from initial 6 min walk test and THRR while considering  patient's activity barriers and safety.   Exercise Prescription Goal: Initial  exercise prescription builds to 30-45 minutes a day of aerobic activity, 2-3 days per week.  Home exercise guidelines will be given to patient during program as part of exercise prescription that the participant will acknowledge.   Education: Aerobic Exercise: - Group verbal and visual presentation on the components of exercise prescription. Introduces F.I.T.T principle from ACSM for exercise prescriptions.  Reviews F.I.T.T. principles of aerobic exercise including progression. Written material given at graduation.   Education: Resistance Exercise: - Group verbal and visual presentation on the components of exercise prescription. Introduces F.I.T.T principle from ACSM for exercise prescriptions  Reviews F.I.T.T. principles of resistance exercise including progression. Written material given at  graduation.    Education: Exercise & Equipment Safety: - Individual verbal instruction and demonstration of equipment use and safety with use of the equipment. Flowsheet Row Cardiac Rehab from 04/14/2023 in [facility] Cardiac and Pulmonary Rehab  Date 04/14/23  Educator MB  Instruction Review Code 1- Verbalizes Understanding       Education: Exercise Physiology & General Exercise Guidelines: - Group verbal and written instruction with models to review the exercise physiology of the cardiovascular system and associated critical values. Provides general exercise guidelines with specific guidelines to those with heart or lung disease.    Education: Flexibility, Balance, Mind/Body Relaxation: - Group verbal and visual presentation with interactive activity on the components of exercise prescription. Introduces F.I.T.T principle from ACSM for exercise prescriptions. Reviews F.I.T.T. principles of flexibility and balance exercise training including progression. Also discusses the mind body connection.  Reviews various relaxation techniques to help reduce and manage stress (i.e. Deep breathing, progressive muscle relaxation, and visualization). Balance handout provided to take home. Written material given at graduation.   Activity Barriers & Risk Stratification:  Activity Barriers & Cardiac Risk Stratification - 04/14/23 1124       Activity Barriers & Cardiac Risk Stratification   Activity Barriers Right Knee Replacement;Left Knee Replacement;Other (comment)    Comments R foot reconstruction, L shoulder replacement, lifting restriction    Cardiac Risk Stratification High             6 Minute Walk:  6 Minute Walk     Row Name 04/14/23 1122         6 Minute Walk   Phase Initial     Distance 930 feet     Walk Time 6 minutes     # of Rest Breaks 0     MPH 1.76     METS 1.8     RPE 11     Perceived Dyspnea  1     VO2 Peak 6.31     Symptoms No     Resting HR 96 bpm     Resting BP  100/78     Resting Oxygen Saturation  96 %     Exercise Oxygen Saturation  during 6 min walk 98 %     Max Ex. HR 101 bpm     Max Ex. BP 136/84     2 Minute Post BP 120/86              Oxygen Initial Assessment:   Oxygen Re-Evaluation:   Oxygen Discharge (Final Oxygen Re-Evaluation):   Initial Exercise Prescription:  Initial Exercise Prescription - 04/14/23 1100       Date of Initial Exercise RX and Referring Provider   Date 04/14/23    Referring Provider [name], MD      Oxygen   Maintain Oxygen Saturation 88% or higher  Treadmill   MPH 1.2    Grade 0    Minutes 15    METs 1.92      NuStep   Level 1    SPM 80    Minutes 15    METs 1.8      Biostep-RELP   Level 1    SPM 50    Minutes 15    METs 1.8      Track   Laps 17    Minutes 15    METs 1.92      Prescription Details   Frequency (times per week) 2    Duration Progress to 30 minutes of continuous aerobic without signs/symptoms of physical distress      Intensity   THRR 40-80% of Max Heartrate 119-143    Ratings of Perceived Exertion 11-13    Perceived Dyspnea 0-4      Progression   Progression Continue to progress workloads to maintain intensity without signs/symptoms of physical distress.      Resistance Training   Training Prescription Yes    Weight 3 lb    Reps 10-15             Perform Capillary Blood Glucose checks as needed.  Exercise Prescription Changes:   Exercise Prescription Changes     Row Name 04/14/23 1100 05/04/23 1100 05/16/23 1500 06/01/23 1400 06/07/23 1000     Response to Exercise   Blood Pressure (Admit) 100/78 120/82 118/70 116/64 --   Blood Pressure (Exercise) 136/84 138/78 138/74 142/74 --   Blood Pressure (Exit) 120/86 122/72 108/64 118/60 --   Heart Rate (Admit) 96 bpm 102 bpm 97 bpm 76 bpm --   Heart Rate (Exercise) 101 bpm 129 bpm 126 bpm 103 bpm --   Heart Rate (Exit) 100 bpm 91 bpm 105 bpm 72 bpm --   Oxygen Saturation (Admit) 96 % --  -- -- --   Oxygen Saturation (Exercise) 98 % -- -- -- --   Oxygen Saturation (Exit) 97 % -- -- -- --   Rating of Perceived Exertion (Exercise) 11 13 13 13  --   Perceived Dyspnea (Exercise) 1 -- -- -- --   Symptoms none none none none --   Comments results First three days of exercise -- -- --   Duration Progress to 30 minutes of  aerobic without signs/symptoms of physical distress Progress to 30 minutes of  aerobic without signs/symptoms of physical distress Progress to 30 minutes of  aerobic without signs/symptoms of physical distress Continue with 30 min of aerobic exercise without signs/symptoms of physical distress. --   Intensity THRR New THRR unchanged THRR unchanged THRR unchanged --     Progression   Progression Continue to progress workloads to maintain intensity without signs/symptoms of physical distress. Continue to progress workloads to maintain intensity without signs/symptoms of physical distress. Continue to progress workloads to maintain intensity without signs/symptoms of physical distress. Continue to progress workloads to maintain intensity without signs/symptoms of physical distress. --   Average METs 1.8 2.83 2.91 3.92 --     Resistance Training   Training Prescription -- Yes Yes Yes --   Weight -- 3 lb 3 lb 3 lb --   Reps -- 10-15 10-15 10-15 --     Interval Training   Interval Training -- No No No --     Treadmill   MPH -- 1.5 2.1 -- --   Grade -- 0 0 -- --   Minutes -- 15 15 -- --  METs -- 2.15 2.61 -- --     NuStep   Level -- 2 3 5  --   Minutes -- 15 15 30  --   METs -- 3.2 3.3 5 --     Biostep-RELP   Level -- 1 2 3  --   Minutes -- 15 15 30  --   METs -- -- 3 3 --     Track   Laps -- 26 -- -- --   Minutes -- 15 -- -- --   METs -- 2.41 -- -- --     Home Exercise Plan   Plans to continue exercise at -- -- -- -- [employer] (comment)  [employer]/gym for aerobic machines and dumbbells   Frequency -- -- -- -- Add 2  additional days to program exercise sessions.   Initial Home Exercises Provided -- -- -- -- 06/07/23     Oxygen   Maintain Oxygen Saturation -- 88% or higher 88% or higher 88% or higher --            Exercise Comments:   Exercise Comments     Row Name 04/21/23 0932           Exercise Comments First full day of exercise!  Patient was oriented to gym and equipment including functions, settings, policies, and procedures.  Patient's individual exercise prescription and treatment plan were reviewed.  All starting workloads were established based on the results of the 6 minute walk test done at initial orientation visit.  The plan for exercise progression was also introduced and progression will be customized based on patient's performance and goals.                Exercise Goals and Review:   Exercise Goals     Row Name 04/14/23 1129             Exercise Goals   Increase Physical Activity Yes       Intervention Provide advice, education, support and counseling about physical activity/exercise needs.;Develop an individualized exercise prescription for aerobic and resistive training based on initial evaluation findings, risk stratification, comorbidities and participant's personal goals.       Expected Outcomes Short Term: Attend rehab on a regular basis to increase amount of physical activity.;Long Term: Add in home exercise to make exercise part of routine and to increase amount of physical activity.;Long Term: Exercising regularly at least 3-5 days a week.       Increase Strength and Stamina Yes       Intervention Provide advice, education, support and counseling about physical activity/exercise needs.;Develop an individualized exercise prescription for aerobic and resistive training based on initial evaluation findings, risk stratification, comorbidities and participant's personal goals.       Expected Outcomes Short Term: Increase workloads from initial exercise  prescription for resistance, speed, and METs.;Short Term: Perform resistance training exercises routinely during rehab and add in resistance training at home;Long Term: Improve cardiorespiratory fitness, muscular endurance and strength as measured by increased METs and functional capacity ( )       Able to understand and use rate of perceived exertion (RPE) scale Yes       Intervention Provide education and explanation on how to use RPE scale       Expected Outcomes Short Term: Able to use RPE daily in rehab to express subjective intensity level;Long Term:  Able to use RPE to guide intensity level when exercising independently       Able to understand and  use Dyspnea scale Yes       Intervention Provide education and explanation on how to use Dyspnea scale       Expected Outcomes Short Term: Able to use Dyspnea scale daily in rehab to express subjective sense of shortness of breath during exertion;Long Term: Able to use Dyspnea scale to guide intensity level when exercising independently       Knowledge and understanding of Target Heart Rate Range (THRR) Yes       Intervention Provide education and explanation of THRR including how the numbers were predicted and where they are located for reference       Expected Outcomes Short Term: Able to state/look up THRR;Short Term: Able to use daily as guideline for intensity in rehab;Long Term: Able to use THRR to govern intensity when exercising independently       Able to check pulse independently Yes       Intervention Provide education and demonstration on how to check pulse in carotid and radial arteries.;Review the importance of being able to check your own pulse for safety during independent exercise       Expected Outcomes Short Term: Able to explain why pulse checking is important during independent exercise;Long Term: Able to check pulse independently and accurately       Understanding of Exercise Prescription Yes       Intervention Provide  education, explanation, and written materials on patient's individual exercise prescription       Expected Outcomes Short Term: Able to explain program exercise prescription;Long Term: Able to explain home exercise prescription to exercise independently                Exercise Goals Re-Evaluation :  Exercise Goals Re-Evaluation     Row Name 04/21/23 0932 05/04/23 1134 05/16/23 1549 06/01/23 1447 06/07/23 1007     Exercise Goal Re-Evaluation   Exercise Goals Review Able to understand and use rate of perceived exertion (RPE) scale;Able to understand and use Dyspnea scale;Knowledge and understanding of Target Heart Rate Range (THRR);Understanding of Exercise Prescription Increase Physical Activity;Increase Strength and Stamina;Understanding of Exercise Prescription Increase Physical Activity;Increase Strength and Stamina;Understanding of Exercise Prescription Increase Physical Activity;Increase Strength and Stamina;Understanding of Exercise Prescription Increase Physical Activity;Able to understand and use Dyspnea scale;Understanding of Exercise Prescription;Increase Strength and Stamina;Knowledge and understanding of Target Heart Rate Range (THRR);Able to check pulse independently;Able to understand and use rate of perceived exertion (RPE) scale   Comments Reviewed RPE and dyspnea scale, THR and program prescription with pt today.  Pt voiced understanding and was given a copy of goals to take home. John Wolf is off to a good start in the program. He was able to increase his treadmill workload up to a speed of 1.5 mph with no incline, and walked up to 26 laps on the track. He also did well on the biostep at level 1 and the T4 nustep at level 2. We will continue to monitor his progress in the program. John Wolf is doing well in the program. He recently increased his treadmill speed to 2.1 mph with no incline. He also improved to level 3 on the T4 nustep and level 2 on the biostep. We will continue to monitor his  progress in the program. John Wolf continues to do well in rehab. He has increased to level 5 on the T4 nustep and level 3 on the biostep. He did the same modality for his aerobic exercise, either the T4 nustep or biostep. We will discuss and encourge adding  in different modalities to add more variety to his exercise. We will continue to monitor (his/her) progress in the program. Reviewed home exercise with pt today.  Pt plans to use the [employer] [facility]/gym for aerobic machines and dumbbells for resistance 1-2 days at home for exercise.  Reviewed THR, pulse, RPE, sign and symptoms, pulse oximetery and when to call 911 or MD.  Also discussed weather considerations and indoor options.  Pt voiced understanding.   Expected Outcomes Short: Use RPE daily to regulate intensity. Long: Follow program prescription in THR. Short: Continue to follow current exercise prescription. Long: Continue exercise to improve strength and stamina. Short: Continue to progressively increase treadmill workload. Long: Continue exercise to improve strength and stamina. Short: Add back in different modalities to exercise prescription and continue to progressively increase. Long: Continue exercise to improve strength and stamina. Short: Add 2 additional days of home exercise. Long: Continue to exercise at home independently.            Discharge Exercise Prescription (Final Exercise Prescription Changes):  Exercise Prescription Changes - 06/07/23 1000       Home Exercise Plan   Plans to continue exercise at [facility] (comment)   [employer]/gym for aerobic machines and dumbbells   Frequency Add 2 additional days to program exercise sessions.    Initial Home Exercises Provided 06/07/23             Nutrition:  Target Goals: Understanding of nutrition guidelines, daily intake of sodium 1500mg , cholesterol 200mg , calories 30% from fat and 7% or less from saturated fats, daily to have 5 or  more servings of fruits and vegetables.  Education: All About Nutrition: -Group instruction provided by verbal, written material, interactive activities, discussions, models, and posters to present general guidelines for heart healthy nutrition including fat, fiber, MyPlate, the role of sodium in heart healthy nutrition, utilization of the nutrition label, and utilization of this knowledge for meal planning. Follow up email sent as well. Written material given at graduation.   Biometrics:  Pre Biometrics - 04/14/23 1130       Pre Biometrics   Height 5\' 7"  (1.702 m)    Weight 260 lb 4.8 oz (118.1 kg)    Waist Circumference 48 inches    Hip Circumference 54 inches    Waist to Hip Ratio 0.89 %    BMI (Calculated) 40.76    Single Leg Stand 2 seconds              Nutrition Therapy Plan and Nutrition Goals:  Nutrition Therapy & Goals - 05/31/23 1116       Nutrition Therapy   Diet Cardiac, Low Na    Protein (specify units) 75-90    Fiber 30 grams    Whole Grain Foods 3 servings    Saturated Fats 15 max. grams    Fruits and Vegetables 5 servings/day    Sodium 2 grams      Personal Nutrition Goals   Nutrition Goal Drink more water, ~32-64oz per day, less alcohol and milk    Personal Goal #2 Read labels and reduce sodium intake to below 2300mg . Ideally 1500mg  per day.    Personal Goal #3 Eat 15-30gProtein and 30-60gCarbs at each meal hour    Comments Patient not drinking much water, but drinks decaf coffee, milk, alcoholic drinks. Discussed cutting back on some of these beverages to fit more water in. He agrees it is a good goal and will try to increase his  water intake. He reports he doesn't eat fried foods or salt his foods either. Commended him on this, reviewed a few facts labels and educated that most sodium in americans diets come from food already high in sodium rather than salting at table. Set goal to reviewed facts labels for foods he eats often and make sure he is eating  less than 1500mg  sodium per day. He rarely eats lunch. Discussed the importance of meeting nutritional goals and how eating only 2 meals per day can make meeting these goals difficult. Set goal to use nutrient and protein rich foods as snacks during missed meals. Reviewed mediterranean diet handout, educated on types of fats, sources, and how to read labels.      Intervention Plan   Intervention Prescribe, educate and counsel regarding individualized specific dietary modifications aiming towards targeted core components such as weight, hypertension, lipid management, diabetes, heart failure and other comorbidities.;Nutrition handout(s) given to patient.    Expected Outcomes Short Term Goal: Understand basic principles of dietary content, such as calories, fat, sodium, cholesterol and nutrients.;Short Term Goal: A plan has been developed with personal nutrition goals set during dietitian appointment.;Long Term Goal: Adherence to prescribed nutrition plan.             Nutrition Assessments:  MEDIFICTS Score Key: >=70 Need to make dietary changes  40-70 Heart Healthy Diet <= 40 Therapeutic Level Cholesterol Diet   Picture Your Plate Scores: <24 Unhealthy dietary pattern with much room for improvement. 41-50 Dietary pattern unlikely to meet recommendations for good health and room for improvement. 51-60 More healthful dietary pattern, with some room for improvement.  >60 Healthy dietary pattern, although there may be some specific behaviors that could be improved.    Nutrition Goals Re-Evaluation:  Nutrition Goals Re-Evaluation     Row Name 05/26/23 [phone]             Goals   Nutrition Goal Schedule appointment with RD       Comment John Wolf would like to schedule an appointment with RD.       Expected Outcome Short: Meet with RD. Long: adhere to a diet that pertains to him.                Nutrition Goals Discharge (Final Nutrition Goals Re-Evaluation):  Nutrition Goals  Re-Evaluation - 05/26/23 0926       Goals   Nutrition Goal Schedule appointment with RD    Comment John Wolf would like to schedule an appointment with RD.    Expected Outcome Short: Meet with RD. Long: adhere to a diet that pertains to him.             Psychosocial: Target Goals: Acknowledge presence or absence of significant depression and/or stress, maximize coping skills, provide positive support system. Participant is able to verbalize types and ability to use techniques and skills needed for reducing stress and depression.   Education: Stress, Anxiety, and Depression - Group verbal and visual presentation to define topics covered.  Reviews how body is impacted by stress, anxiety, and depression.  Also discusses healthy ways to reduce stress and to treat/manage anxiety and depression.  Written material given at graduation.   Education: Sleep Hygiene -Provides group verbal and written instruction about how sleep can affect your health.  Define sleep hygiene, discuss sleep cycles and impact of sleep habits. Review good sleep hygiene tips.    Initial Review & Psychosocial Screening:  Initial Psych Review & Screening - 04/11/23 1123  Initial Review   Current issues with Current Sleep Concerns;Current Stress Concerns    Source of Stress Concerns Chronic Illness      Family Dynamics   Good Support System? Yes    Comments Lathen sleeps with a CPAP but he has not been able to use it since he gets up alot and cant sleep enough to use it. He can look to his wife for support.      Barriers   Psychosocial barriers to participate in program The patient should benefit from training in stress management and relaxation.      Screening Interventions   Interventions Encouraged to exercise;To provide support and resources with identified psychosocial needs;Provide feedback about the scores to participant    Expected Outcomes Short Term goal: Utilizing psychosocial counselor, staff and  physician to assist with identification of specific Stressors or current issues interfering with healing process. Setting desired goal for each stressor or current issue identified.;Long Term Goal: Stressors or current issues are controlled or eliminated.;Short Term goal: Identification and review with participant of any Quality of Life or Depression concerns found by scoring the questionnaire.;Long Term goal: The participant improves quality of Life and PHQ9 Scores as seen by post scores and/or verbalization of changes             Quality of Life Scores:   Scores of 19 and below usually indicate a poorer quality of life in these areas.  A difference of  2-3 points is a clinically meaningful difference.  A difference of 2-3 points in the total score of the Quality of Life Index has been associated with significant improvement in overall quality of life, self-image, physical symptoms, and general health in studies assessing change in quality of life.  PHQ-9: Review Flowsheet  More data may exist      05/26/2023 04/14/2023 03/15/2023 06/01/2022 10/17/2020  Depression screen PHQ 2/9  Decreased Interest 0 1 0 3 0  Down, Depressed, Hopeless 0 0 0 3 0  PHQ - 2 Score 0 1 0 6 0  Altered sleeping 2 2 - 3 3  Tired, decreased energy 3 1 - 3 3  Change in appetite 0 0 - 2 0  Feeling bad or failure about yourself  0 0 - 2 1  Trouble concentrating 0 1 - 3 1  Moving slowly or fidgety/restless 0 0 - 3 1  Suicidal thoughts 0 0 - 0 0  PHQ-9 Score 5 5 - 22 9  Difficult doing work/chores Not difficult at all Somewhat difficult - Extremely dIfficult Somewhat difficult   Interpretation of Total Score  Total Score Depression Severity:  1-4 = Minimal depression, 5-9 = Mild depression, 10-14 = Moderate depression, 15-19 = Moderately severe depression, 20-27 = Severe depression   Psychosocial Evaluation and Intervention:  Psychosocial Evaluation - 04/11/23 1125       Psychosocial Evaluation & Interventions    Interventions Relaxation education;Stress management education;Encouraged to exercise with the program and follow exercise prescription    Comments John Wolf sleeps with a CPAP but he has not been able to use it since he gets up alot and cant sleep enough to use it. He can look to his wife for support.    Expected Outcomes Short: Start [facility] to help with mood. Long: Maintain a healthy mental state    Continue Psychosocial Services  Follow up required by staff             Psychosocial Re-Evaluation:  Psychosocial Re-Evaluation     Row  Name 05/26/23 0933             Psychosocial Re-Evaluation   Current issues with Current Stress Concerns;Current Sleep Concerns       Comments Reviewed patient health questionnaire (PHQ-9) with patient for follow up. Previously, patients score indicated signs/symptoms of depression.  Reviewed to see if patient is improving symptom wise while in program. Score stayed the same and patient states that it is because he does not sleep well and has a lack of energy.       Expected Outcomes Short: Continue to work toward an improvement in PHQ9 scores by attending [facility] regularly. Long: Continue to improve stress and depression coping skills by talking with staff and attending [facility] regularly and work toward a positive mental state.       Interventions Encouraged to attend Cardiac Rehabilitation for the exercise       Continue Psychosocial Services  Follow up required by staff                Psychosocial Discharge (Final Psychosocial Re-Evaluation):  Psychosocial Re-Evaluation - 05/26/23 0933       Psychosocial Re-Evaluation   Current issues with Current Stress Concerns;Current Sleep Concerns    Comments Reviewed patient health questionnaire (PHQ-9) with patient for follow up. Previously, patients score indicated signs/symptoms of depression.  Reviewed to see if patient is improving symptom wise while in program. Score stayed the same and  patient states that it is because he does not sleep well and has a lack of energy.    Expected Outcomes Short: Continue to work toward an improvement in PHQ9 scores by attending [facility] regularly. Long: Continue to improve stress and depression coping skills by talking with staff and attending [facility] regularly and work toward a positive mental state.    Interventions Encouraged to attend Cardiac Rehabilitation for the exercise    Continue Psychosocial Services  Follow up required by staff             Vocational Rehabilitation: Provide vocational rehab assistance to qualifying candidates.   Vocational Rehab Evaluation & Intervention:   Education: Education Goals: Education classes will be provided on a variety of topics geared toward better understanding of heart health and risk factor modification. Participant will state understanding/return demonstration of topics presented as noted by education test scores.  Learning Barriers/Preferences:  Learning Barriers/Preferences - 04/11/23 1123       Learning Barriers/Preferences   Learning Barriers None    Learning Preferences None             General Cardiac Education Topics:  AED/CPR: - Group verbal and written instruction with the use of models to demonstrate the basic use of the AED with the basic ABC's of resuscitation.   Anatomy and Cardiac Procedures: - Group verbal and visual presentation and models provide information about basic cardiac anatomy and function. Reviews the testing methods done to diagnose heart disease and the outcomes of the test results. Describes the treatment choices: Medical Management, Angioplasty, or Coronary Bypass Surgery for treating various heart conditions including Myocardial Infarction, Angina, Valve Disease, and Cardiac Arrhythmias.  Written material given at graduation.   Medication Safety: - Group verbal and visual instruction to review commonly prescribed medications for heart and  lung disease. Reviews the medication, class of the drug, and side effects. Includes the steps to properly store meds and maintain the prescription regimen.  Written material given at graduation.   Intimacy: - Group verbal instruction through game format to discuss how  heart and lung disease can affect sexual intimacy. Written material given at graduation..   Know Your Numbers and Heart Failure: - Group verbal and visual instruction to discuss disease risk factors for cardiac and pulmonary disease and treatment options.  Reviews associated critical values for Overweight/Obesity, Hypertension, Cholesterol, and Diabetes.  Discusses basics of heart failure: signs/symptoms and treatments.  Introduces Heart Failure Zone chart for action plan for heart failure.  Written material given at graduation.   Infection Prevention: - Provides verbal and written material to individual with discussion of infection control including proper hand washing and proper equipment cleaning during exercise session. Flowsheet Row Cardiac Rehab from 04/14/2023 in [facility] Cardiac and Pulmonary Rehab  Date 04/14/23  Educator MB  Instruction Review Code 1- Verbalizes Understanding       Falls Prevention: - Provides verbal and written material to individual with discussion of falls prevention and safety. Flowsheet Row Cardiac Rehab from 04/14/2023 in [facility] Cardiac and Pulmonary Rehab  Date 04/14/23  Educator MB  Instruction Review Code 1- Verbalizes Understanding       Other: -Provides group and verbal instruction on various topics (see comments)   Knowledge Questionnaire Score:   Core Components/Risk Factors/Patient Goals at Admission:  Personal Goals and Risk Factors at Admission - 04/14/23 1132       Core Components/Risk Factors/Patient Goals on Admission    Weight Management Yes;Weight Loss    Intervention Weight Management: Develop a combined nutrition and exercise program designed to reach desired  caloric intake, while maintaining appropriate intake of nutrient and fiber, sodium and fats, and appropriate energy expenditure required for the weight goal.;Weight Management: Provide education and appropriate resources to help participant work on and attain dietary goals.;Weight Management/Obesity: Establish reasonable short term and long term weight goals.    Admit Weight 260 lb 4.8 oz (118.1 kg)    Goal Weight: Short Term 255 lb (115.7 kg)    Goal Weight: Long Term 250 lb (113.4 kg)    Expected Outcomes Long Term: Adherence to nutrition and physical activity/exercise program aimed toward attainment of established weight goal;Short Term: Continue to assess and modify interventions until short term weight is achieved;Weight Loss: Understanding of general recommendations for a balanced deficit meal plan, which promotes 1-2 lb weight loss per week and includes a negative energy balance of [phone] kcal/d;Understanding recommendations for meals to include 15-35% energy as protein, 25-35% energy from fat, 35-60% energy from carbohydrates, less than 200mg  of dietary cholesterol, 20-35 gm of total fiber daily;Understanding of distribution of calorie intake throughout the day with the consumption of 4-5 meals/snacks    Heart Failure Yes    Intervention Provide a combined exercise and nutrition program that is supplemented with education, support and counseling about heart failure. Directed toward relieving symptoms such as shortness of breath, decreased exercise tolerance, and extremity edema.    Expected Outcomes Improve functional capacity of life;Short term: Attendance in program 2-3 days a week with increased exercise capacity. Reported lower sodium intake. Reported increased fruit and vegetable intake. Reports medication compliance.;Short term: Daily weights obtained and reported for increase. Utilizing diuretic protocols set by physician.    Hypertension Yes    Intervention Provide education on lifestyle  modifcations including regular physical activity/exercise, weight management, moderate sodium restriction and increased consumption of fresh fruit, vegetables, and low fat dairy, alcohol moderation, and smoking cessation.;Monitor prescription use compliance.    Expected Outcomes Short Term: Continued assessment and intervention until BP is < 140/26mm HG in hypertensive participants. < 130/32mm HG  in hypertensive participants with diabetes, heart failure or chronic kidney disease.;Long Term: Maintenance of blood pressure at goal levels.             Education:Diabetes - Individual verbal and written instruction to review signs/symptoms of diabetes, desired ranges of glucose level fasting, after meals and with exercise. Acknowledge that pre and post exercise glucose checks will be done for 3 sessions at entry of program.   Core Components/Risk Factors/Patient Goals Review:   Goals and Risk Factor Review     Row Name 05/26/23 0928             Core Components/Risk Factors/Patient Goals Review   Personal Goals Review Weight Management/Obesity       Review John Wolf wants to lose weight and reach a 240 pounds weight goal. He has been working hard in the program and is wanting to improve his overall health. He has a realistic goal of wanting to lose 20 pounds in 4-6 months.       Expected Outcomes Short: lose a few pounds in the next couple weeks. Long: reach weight goal.                Core Components/Risk Factors/Patient Goals at Discharge (Final Review):   Goals and Risk Factor Review - 05/26/23 0928       Core Components/Risk Factors/Patient Goals Review   Personal Goals Review Weight Management/Obesity    Review John Wolf wants to lose weight and reach a 240 pounds weight goal. He has been working hard in the program and is wanting to improve his overall health. He has a realistic goal of wanting to lose 20 pounds in 4-6 months.    Expected Outcomes Short: lose a few pounds in the next  couple weeks. Long: reach weight goal.             ITP Comments:  ITP Comments     Row Name 04/11/23 1128 04/14/23 1122 04/21/23 0932 05/11/23 1452     ITP Comments Virtual Visit completed. Patient informed on EP and RD appointment and 6 Minute walk test. Patient also informed of patient health questionnaires on My Chart. Patient Verbalizes understanding. Visit diagnosis can be found in [facility] 02/01/[facility]23. Completed and gym orientation. Initial ITP created and sent for review to Dr. [name], Medical Director. First full day of exercise!  Patient was oriented to gym and equipment including functions, settings, policies, and procedures.  Patient's individual exercise prescription and treatment plan were reviewed.  All starting workloads were established based on the results of the 6 minute walk test done at initial orientation visit.  The plan for exercise progression was also introduced and progression will be customized based on patient's performance and goals. 30 Day review completed. Medical Director ITP review done, changes made as directed, and signed approval by Medical Director.             Comments: 30 day review

## 2023-06-09 ENCOUNTER — Encounter: Payer: Medicare Other | Admitting: *Deleted

## 2023-06-09 ENCOUNTER — Other Ambulatory Visit: Payer: Self-pay | Admitting: Internal Medicine

## 2023-06-09 DIAGNOSIS — Z952 Presence of prosthetic heart valve: Secondary | ICD-10-CM | POA: Diagnosis not present

## 2023-06-09 MED ORDER — PANTOPRAZOLE SODIUM 40 MG PO TBEC: 40.0000 mg | DELAYED_RELEASE_TABLET | Freq: Every day | ORAL | 3 refills | Status: AC

## 2023-06-09 NOTE — Progress Notes (Signed)
Daily Session Note  Patient Details  Name: John Wolf MRN: [id number] Date of Birth: May 11, 1955 Referring Provider:   Flowsheet Row Cardiac Rehab from 04/14/2023 in [facility] Cardiac and Pulmonary Rehab  Referring Provider [name], MD       Encounter Date: 06/09/2023  Check In:  Session Check In - 06/09/23 0933       Check-In   Supervising physician immediately available to respond to emergencies See telemetry face sheet for immediately available ER MD    Location [facility]-Cardiac & Pulmonary Rehab    Staff Present Cora Collum, RN, BSN, CCRP;[name] BS, Exercise Physiologist;Joseph Hood RCP,RRT,BSRT;[name], [place], Exercise Physiologist    Virtual Visit No    Medication changes reported     No    Fall or balance concerns reported    No    Warm-up and Cool-down Performed on first and last piece of equipment    Resistance Training Performed Yes    VAD Patient? No    PAD/SET Patient? No      Pain Assessment   Currently in Pain? No/denies                Social History   Tobacco Use  Smoking Status Never  Smokeless Tobacco Never    Goals Met:  Independence with exercise equipment Exercise tolerated well No report of concerns or symptoms today  Goals Unmet:  Not Applicable  Comments: Pt able to follow exercise prescription today without complaint.  Will continue to monitor for progression.    Dr. [name] is Medical Director for [facility] Cardiac Rehabilitation.  Dr. [name] is Medical Director for [facility] Pulmonary Rehabilitation.

## 2023-06-21 ENCOUNTER — Encounter: Payer: Medicare Other | Admitting: *Deleted

## 2023-06-21 DIAGNOSIS — Z952 Presence of prosthetic heart valve: Secondary | ICD-10-CM

## 2023-06-21 NOTE — Progress Notes (Signed)
 Daily Session Note  Patient Details  Name: DAYN BARICH MRN: [id number] Date of Birth: [birth year] Referring Provider:   Flowsheet Row Cardiac Rehab from 04/14/2023 in [facility] Cardiac and Pulmonary Rehab  Referring Provider [name], MD       Encounter Date: 06/21/2023  Check In:  Session Check In - 06/21/23 0934       Check-In   Supervising physician immediately available to respond to emergencies See telemetry face sheet for immediately available ER MD    Location [facility]-Cardiac & Pulmonary Rehab    Staff Present Cora Collum, RN, BSN, CCRP;Margaret Best, MS, Exercise Physiologist;[name] BS, Exercise Physiologist    Virtual Visit No    Medication changes reported     No    Fall or balance concerns reported    No    Warm-up and Cool-down Performed on first and last piece of equipment    Resistance Training Performed Yes    VAD Patient? No    PAD/SET Patient? No      Pain Assessment   Currently in Pain? No/denies                Social History   Tobacco Use  Smoking Status Never  Smokeless Tobacco Never    Goals Met:  Independence with exercise equipment Exercise tolerated well No report of concerns or symptoms today  Goals Unmet:  Not Applicable  Comments: Pt able to follow exercise prescription today without complaint.  Will continue to monitor for progression.    Dr. [name] is Medical Director for [facility] Cardiac Rehabilitation.  Dr. [name] is Medical Director for [facility] Pulmonary Rehabilitation.

## 2023-06-23 ENCOUNTER — Encounter: Payer: Medicare Other | Admitting: *Deleted

## 2023-06-23 DIAGNOSIS — Z952 Presence of prosthetic heart valve: Secondary | ICD-10-CM

## 2023-06-23 NOTE — Progress Notes (Signed)
 Daily Session Note  Patient Details  Name: DONAVIN AUDINO MRN: [id number] Date of Birth: 04-22-1956 Referring Provider:   Flowsheet Row Cardiac Rehab from 04/14/2023 in [facility] Cardiac and Pulmonary Rehab  Referring Provider [name], MD       Encounter Date: 06/23/2023  Check In:  Session Check In - 06/23/23 7829       Check-In   Supervising physician immediately available to respond to emergencies See telemetry face sheet for immediately available ER MD    Location [facility]-Cardiac & Pulmonary Rehab    Staff Present Susann Givens RN,BSN;Joseph Reino Kent RCP,RRT,BSRT;Noah Dennis, [place], Exercise Physiologist    Virtual Visit No    Medication changes reported     No    Fall or balance concerns reported    No    Warm-up and Cool-down Performed on first and last piece of equipment    Resistance Training Performed Yes    VAD Patient? No    PAD/SET Patient? No      Pain Assessment   Currently in Pain? No/denies                Social History   Tobacco Use  Smoking Status Never  Smokeless Tobacco Never    Goals Met:  Independence with exercise equipment Exercise tolerated well No report of concerns or symptoms today Strength training completed today  Goals Unmet:  Not Applicable  Comments: Pt able to follow exercise prescription today without complaint.  Will continue to monitor for progression.    Dr. [name] is Medical Director for [facility] Cardiac Rehabilitation.  Dr. [name] is Medical Director for [facility] Pulmonary Rehabilitation.

## 2023-06-28 ENCOUNTER — Encounter: Payer: Medicare Other | Attending: Family Medicine | Admitting: *Deleted

## 2023-06-28 DIAGNOSIS — Z48812 Encounter for surgical aftercare following surgery on the circulatory system: Secondary | ICD-10-CM | POA: Insufficient documentation

## 2023-06-28 DIAGNOSIS — Z952 Presence of prosthetic heart valve: Secondary | ICD-10-CM | POA: Diagnosis not present

## 2023-06-28 DIAGNOSIS — Z9889 Other specified postprocedural states: Secondary | ICD-10-CM | POA: Insufficient documentation

## 2023-06-28 NOTE — Progress Notes (Signed)
 Daily Session Note  Patient Details  Name: John Wolf MRN: [id number] Date of Birth: 02/05/1956 Referring Provider:   Flowsheet Row Cardiac Rehab from 04/14/2023 in [facility] Cardiac and Pulmonary Rehab  Referring Provider [name], MD       Encounter Date: 06/28/2023  Check In:  Session Check In - 06/28/23 0932       Check-In   Supervising physician immediately available to respond to emergencies See telemetry face sheet for immediately available ER MD    Location [facility]-Cardiac & Pulmonary Rehab    Staff Present [name], MS, Exercise Physiologist;Keyarra Rendall, RN, BSN, CCRP;[name], BS, Exercise Physiologist    Virtual Visit No    Medication changes reported     No    Fall or balance concerns reported    No    Warm-up and Cool-down Performed on first and last piece of equipment    Resistance Training Performed Yes    VAD Patient? No    PAD/SET Patient? No      Pain Assessment   Currently in Pain? No/denies                Social History   Tobacco Use  Smoking Status Never  Smokeless Tobacco Never    Goals Met:  Independence with exercise equipment Exercise tolerated well No report of concerns or symptoms today  Goals Unmet:  Not Applicable  Comments: Pt able to follow exercise prescription today without complaint.  Will continue to monitor for progression.    Dr. [name] is Medical Director for [facility] Cardiac Rehabilitation.  Dr. [name] is Medical Director for [facility] Pulmonary Rehabilitation.

## 2023-06-30 ENCOUNTER — Encounter: Payer: Medicare Other | Admitting: *Deleted

## 2023-06-30 DIAGNOSIS — Z952 Presence of prosthetic heart valve: Secondary | ICD-10-CM

## 2023-06-30 DIAGNOSIS — Z48812 Encounter for surgical aftercare following surgery on the circulatory system: Secondary | ICD-10-CM | POA: Diagnosis not present

## 2023-06-30 NOTE — Progress Notes (Signed)
 Daily Session Note  Patient Details  Name: John Wolf MRN: [id number] Date of Birth: February 22, 1956 Referring Provider:   Flowsheet Row Cardiac Rehab from 04/14/2023 in [facility] Cardiac and Pulmonary Rehab  Referring Provider [name], MD       Encounter Date: 06/30/2023  Check In:  Session Check In - 06/30/23 0929       Check-In   Supervising physician immediately available to respond to emergencies See telemetry face sheet for immediately available ER MD    Location [facility]-Cardiac & Pulmonary Rehab    Staff Present Cora Collum, RN, BSN, CCRP;Margaret Best, MS, Exercise Physiologist;Joseph Reino Kent RCP,RRT,BSRT    Virtual Visit No    Medication changes reported     No    Fall or balance concerns reported    No    Warm-up and Cool-down Performed on first and last piece of equipment    Resistance Training Performed Yes    VAD Patient? No    PAD/SET Patient? No      Pain Assessment   Currently in Pain? No/denies                Social History   Tobacco Use  Smoking Status Never  Smokeless Tobacco Never    Goals Met:  Independence with exercise equipment Exercise tolerated well No report of concerns or symptoms today  Goals Unmet:  Not Applicable  Comments: Pt able to follow exercise prescription today without complaint.  Will continue to monitor for progression.    Dr. [name] is Medical Director for [facility] Cardiac Rehabilitation.  Dr. [name] is Medical Director for [facility] Pulmonary Rehabilitation.

## 2023-07-01 ENCOUNTER — Other Ambulatory Visit: Payer: Self-pay | Admitting: Internal Medicine

## 2023-07-05 ENCOUNTER — Encounter: Payer: Medicare Other | Admitting: *Deleted

## 2023-07-05 DIAGNOSIS — Z48812 Encounter for surgical aftercare following surgery on the circulatory system: Secondary | ICD-10-CM | POA: Diagnosis not present

## 2023-07-05 DIAGNOSIS — Z952 Presence of prosthetic heart valve: Secondary | ICD-10-CM

## 2023-07-05 NOTE — Progress Notes (Signed)
 Daily Session Note  Patient Details  Name: John Wolf MRN: [id number] Date of Birth: Mar 31, 1956 Referring Provider:   Flowsheet Row Cardiac Rehab from 04/14/2023 in [facility] Cardiac and Pulmonary Rehab  Referring Provider [name], MD       Encounter Date: 07/05/2023  Check In:  Session Check In - 07/05/23 0927       Check-In   Supervising physician immediately available to respond to emergencies See telemetry face sheet for immediately available ER MD    Location [facility]-Cardiac & Pulmonary Rehab    Staff Present Cora Collum, RN, BSN, CCRP;Laureen Manson Passey, BS, RRT, CPFT;Margaret Best, MS, Exercise Physiologist    Virtual Visit No    Medication changes reported     No    Fall or balance concerns reported    No    Warm-up and Cool-down Performed on first and last piece of equipment    Resistance Training Performed No    VAD Patient? No    PAD/SET Patient? No      Pain Assessment   Currently in Pain? No/denies                Social History   Tobacco Use  Smoking Status Never  Smokeless Tobacco Never    Goals Met:  Independence with exercise equipment Exercise tolerated well No report of concerns or symptoms today  Goals Unmet:  Not Applicable  Comments: Pt able to follow exercise prescription today without complaint.  Will continue to monitor for progression.    Dr. [name] is Medical Director for [facility] Cardiac Rehabilitation.  Dr. [name] is Medical Director for [facility] Pulmonary Rehabilitation.

## 2023-07-06 DIAGNOSIS — Z952 Presence of prosthetic heart valve: Secondary | ICD-10-CM

## 2023-07-06 NOTE — Progress Notes (Signed)
 Cardiac Individual Treatment Plan  Patient Details  Name: John Wolf MRN: [id number] Date of Birth: 1956-03-03 Referring Provider:   Flowsheet Row Cardiac Rehab from 04/14/2023 in [facility] Cardiac and Pulmonary Rehab  Referring Provider [name], MD       Initial Encounter Date:  Flowsheet Row Cardiac Rehab from 04/14/2023 in [facility] Cardiac and Pulmonary Rehab  Date 04/14/23       Visit Diagnosis: S/P AVR (aortic valve replacement)  Patient's Home Medications on Admission:  Current Outpatient Medications:    ALPRAZolam (XANAX) 0.25 MG tablet, Take 1 tablet (0.25 mg total) by mouth at bedtime as needed for anxiety. Or insomnia, Disp: 30 tablet, Rfl: 0   amiodarone (PACERONE) 200 MG tablet, Take 1 tablet (200 mg total) by mouth daily., Disp: 90 tablet, Rfl: 1   apixaban (ELIQUIS) 5 MG TABS tablet, Take 1 tablet (5 mg total) by mouth 2 (two) times daily., Disp: 60 tablet, Rfl: 5   atorvastatin (LIPITOR) 80 MG tablet, Take 1 tablet (80 mg total) by mouth daily., Disp: 90 tablet, Rfl: 1   furosemide (LASIX) 40 MG tablet, TAKE ONE TABLET BY MOUTH ONCE DAILY, Disp: 90 tablet, Rfl: 1   Melatonin 1 MG CAPS, Take by mouth., Disp: , Rfl:    metoprolol tartrate (LOPRESSOR) 25 MG tablet, Take 1 tablet (25 mg total) by mouth 2 (two) times daily., Disp: 180 tablet, Rfl: 1   ondansetron (ZOFRAN-ODT) 4 MG disintegrating tablet, Take 1 tablet (4 mg total) by mouth every 8 (eight) hours as needed for nausea or vomiting., Disp: 20 tablet, Rfl: 0   pantoprazole (PROTONIX) 40 MG tablet, Take 1 tablet (40 mg total) by mouth daily., Disp: 90 tablet, Rfl: 3   spironolactone (ALDACTONE) 25 MG tablet, Take 1 tablet (25 mg total) by mouth daily., Disp: 90 tablet, Rfl: 1   sulfamethoxazole-trimethoprim (BACTRIM DS) 800-160 MG tablet, Take 1 tablet by mouth 2 (two) times daily., Disp: 10 tablet, Rfl: 0   traZODone (DESYREL) 50 MG tablet, Take 0.5-1 tablets (25-50 mg total) by mouth at bedtime as needed for  sleep., Disp: 90 tablet, Rfl: 3  Past Medical History: Past Medical History:  Diagnosis Date   Acquired pes planus    Arthritis    Benign neoplasm of colon    History of left knee replacement    Hyperlipidemia    Hypertension    Obesity (BMI 30-39.9)    with 70 lb weight loss, ongoing   Onychia and paronychia of toe    Sleep apnea, obstructive 04/26/2000   wears CPAP    Tobacco Use: Social History   Tobacco Use  Smoking Status Never  Smokeless Tobacco Never    Labs: Review Flowsheet  More data exists      Latest Ref Rng & Units 06/16/2018 05/25/2019 08/01/2020 03/10/2021 06/01/2022  Labs for ITP Cardiac and Pulmonary Rehab  Cholestrol <200 mg/dL - - 409  811  914   LDL (calc) mg/dL (calc) - - 782  86  78   Direct LDL mg/dL 956.2  - - - -  HDL-C > OR = 40 mg/dL - - 13.08  65.78  70   Trlycerides <150 mg/dL - - 469.6  29.5  92   Hemoglobin A1c 4.6 - 6.5 % 5.2  5.3  - 4.7  -     Exercise Target Goals: Exercise Program Goal: Individual exercise prescription set using results from initial 6 min walk test and THRR while considering  patient's activity barriers and safety.  Exercise Prescription Goal: Initial exercise prescription builds to 30-45 minutes a day of aerobic activity, 2-3 days per week.  Home exercise guidelines will be given to patient during program as part of exercise prescription that the participant will acknowledge.   Education: Aerobic Exercise: - Group verbal and visual presentation on the components of exercise prescription. Introduces F.I.T.T principle from ACSM for exercise prescriptions.  Reviews F.I.T.T. principles of aerobic exercise including progression. Written material given at graduation.   Education: Resistance Exercise: - Group verbal and visual presentation on the components of exercise prescription. Introduces F.I.T.T principle from ACSM for exercise prescriptions  Reviews F.I.T.T. principles of resistance exercise including progression.  Written material given at graduation.    Education: Exercise & Equipment Safety: - Individual verbal instruction and demonstration of equipment use and safety with use of the equipment. Flowsheet Row Cardiac Rehab from 04/14/2023 in [facility] Cardiac and Pulmonary Rehab  Date 04/14/23  Educator MB  Instruction Review Code 1- Verbalizes Understanding       Education: Exercise Physiology & General Exercise Guidelines: - Group verbal and written instruction with models to review the exercise physiology of the cardiovascular system and associated critical values. Provides general exercise guidelines with specific guidelines to those with heart or lung disease.    Education: Flexibility, Balance, Mind/Body Relaxation: - Group verbal and visual presentation with interactive activity on the components of exercise prescription. Introduces F.I.T.T principle from ACSM for exercise prescriptions. Reviews F.I.T.T. principles of flexibility and balance exercise training including progression. Also discusses the mind body connection.  Reviews various relaxation techniques to help reduce and manage stress (i.e. Deep breathing, progressive muscle relaxation, and visualization). Balance handout provided to take home. Written material given at graduation.   Activity Barriers & Risk Stratification:  Activity Barriers & Cardiac Risk Stratification - 04/14/23 1124       Activity Barriers & Cardiac Risk Stratification   Activity Barriers Right Knee Replacement;Left Knee Replacement;Other (comment)    Comments R foot reconstruction, L shoulder replacement, lifting restriction    Cardiac Risk Stratification High             6 Minute Walk:  6 Minute Walk     Row Name 04/14/23 1122         6 Minute Walk   Phase Initial     Distance 930 feet     Walk Time 6 minutes     # of Rest Breaks 0     MPH 1.76     METS 1.8     RPE 11     Perceived Dyspnea  1     VO2 Peak 6.31     Symptoms No     Resting  HR 96 bpm     Resting BP 100/78     Resting Oxygen Saturation  96 %     Exercise Oxygen Saturation  during 6 min walk 98 %     Max Ex. HR 101 bpm     Max Ex. BP 136/84     2 Minute Post BP 120/86              Oxygen Initial Assessment:   Oxygen Re-Evaluation:   Oxygen Discharge (Final Oxygen Re-Evaluation):   Initial Exercise Prescription:  Initial Exercise Prescription - 04/14/23 1100       Date of Initial Exercise RX and Referring Provider   Date 04/14/23    Referring Provider [name], MD      Oxygen   Maintain Oxygen Saturation 88%  or higher      Treadmill   MPH 1.2    Grade 0    Minutes 15    METs 1.92      NuStep   Level 1    SPM 80    Minutes 15    METs 1.8      Biostep-RELP   Level 1    SPM 50    Minutes 15    METs 1.8      Track   Laps 17    Minutes 15    METs 1.92      Prescription Details   Frequency (times per week) 2    Duration Progress to 30 minutes of continuous aerobic without signs/symptoms of physical distress      Intensity   THRR 40-80% of Max Heartrate 119-143    Ratings of Perceived Exertion 11-13    Perceived Dyspnea 0-4      Progression   Progression Continue to progress workloads to maintain intensity without signs/symptoms of physical distress.      Resistance Training   Training Prescription Yes    Weight 3 lb    Reps 10-15             Perform Capillary Blood Glucose checks as needed.  Exercise Prescription Changes:   Exercise Prescription Changes     Row Name 04/14/23 1100 05/04/23 1100 05/16/23 1500 06/01/23 1400 06/07/23 1000     Response to Exercise   Blood Pressure (Admit) 100/78 120/82 118/70 116/64 --   Blood Pressure (Exercise) 136/84 138/78 138/74 142/74 --   Blood Pressure (Exit) 120/86 122/72 108/64 118/60 --   Heart Rate (Admit) 96 bpm 102 bpm 97 bpm 76 bpm --   Heart Rate (Exercise) 101 bpm 129 bpm 126 bpm 103 bpm --   Heart Rate (Exit) 100 bpm 91 bpm 105 bpm 72 bpm --   Oxygen  Saturation (Admit) 96 % -- -- -- --   Oxygen Saturation (Exercise) 98 % -- -- -- --   Oxygen Saturation (Exit) 97 % -- -- -- --   Rating of Perceived Exertion (Exercise) 11 13 13 13  --   Perceived Dyspnea (Exercise) 1 -- -- -- --   Symptoms none none none none --   Comments results First three days of exercise -- -- --   Duration Progress to 30 minutes of  aerobic without signs/symptoms of physical distress Progress to 30 minutes of  aerobic without signs/symptoms of physical distress Progress to 30 minutes of  aerobic without signs/symptoms of physical distress Continue with 30 min of aerobic exercise without signs/symptoms of physical distress. --   Intensity THRR New THRR unchanged THRR unchanged THRR unchanged --     Progression   Progression Continue to progress workloads to maintain intensity without signs/symptoms of physical distress. Continue to progress workloads to maintain intensity without signs/symptoms of physical distress. Continue to progress workloads to maintain intensity without signs/symptoms of physical distress. Continue to progress workloads to maintain intensity without signs/symptoms of physical distress. --   Average METs 1.8 2.83 2.91 3.92 --     Resistance Training   Training Prescription -- Yes Yes Yes --   Weight -- 3 lb 3 lb 3 lb --   Reps -- 10-15 10-15 10-15 --     Interval Training   Interval Training -- No No No --     Treadmill   MPH -- 1.5 2.1 -- --   Grade -- 0 0 -- --  Minutes -- 15 15 -- --   METs -- 2.15 2.61 -- --     NuStep   Level -- 2 3 5  --   Minutes -- 15 15 30  --   METs -- 3.2 3.3 5 --     Biostep-RELP   Level -- 1 2 3  --   Minutes -- 15 15 30  --   METs -- -- 3 3 --     Track   Laps -- 26 -- -- --   Minutes -- 15 -- -- --   METs -- 2.41 -- -- --     Home Exercise Plan   Plans to continue exercise at -- -- -- -- [employer] (comment)  [employer]/gym for aerobic machines and dumbbells   Frequency  -- -- -- -- Add 2 additional days to program exercise sessions.   Initial Home Exercises Provided -- -- -- -- 06/07/23     Oxygen   Maintain Oxygen Saturation -- 88% or higher 88% or higher 88% or higher --    Row Name 06/15/23 1000 06/28/23 1400           Response to Exercise   Blood Pressure (Admit) 114/66 112/66      Blood Pressure (Exit) 118/64 118/64      Heart Rate (Admit) 75 bpm 69 bpm      Heart Rate (Exercise) 106 bpm 110 bpm      Heart Rate (Exit) 79 bpm 85 bpm      Rating of Perceived Exertion (Exercise) 14 13      Symptoms none none      Duration Continue with 30 min of aerobic exercise without signs/symptoms of physical distress. Continue with 30 min of aerobic exercise without signs/symptoms of physical distress.      Intensity THRR unchanged THRR unchanged        Progression   Progression Continue to progress workloads to maintain intensity without signs/symptoms of physical distress. Continue to progress workloads to maintain intensity without signs/symptoms of physical distress.      Average METs 4.09 3.57        Resistance Training   Training Prescription Yes Yes      Weight 4 lb 4 lb      Reps 10-15 10-15        Interval Training   Interval Training No No        Treadmill   MPH 1.8 --      Grade 0 --      Minutes 15 --      METs 2.38 --        NuStep   Level 3 4      Minutes 15 30      METs 4.7 4.7        REL-XR   Level 4 --      Minutes 30 --      METs 5.5 --        Biostep-RELP   Level 4 5      Minutes 30 30      METs 4 3        Home Exercise Plan   Plans to continue exercise at [employer] (comment)  [employer]/gym for aerobic machines and [employer] (comment)  [employer]/gym for aerobic machines and dumbbells      Frequency Add 2 additional days to program exercise sessions. Add 2 additional days to program exercise sessions.  Initial Home Exercises Provided 06/07/23  06/07/23        Oxygen   Maintain Oxygen Saturation 88% or higher 88% or higher               Exercise Comments:   Exercise Comments     Row Name 04/21/23 0932           Exercise Comments First full day of exercise!  Patient was oriented to gym and equipment including functions, settings, policies, and procedures.  Patient's individual exercise prescription and treatment plan were reviewed.  All starting workloads were established based on the results of the 6 minute walk test done at initial orientation visit.  The plan for exercise progression was also introduced and progression will be customized based on patient's performance and goals.                Exercise Goals and Review:   Exercise Goals     Row Name 04/14/23 1129             Exercise Goals   Increase Physical Activity Yes       Intervention Provide advice, education, support and counseling about physical activity/exercise needs.;Develop an individualized exercise prescription for aerobic and resistive training based on initial evaluation findings, risk stratification, comorbidities and participant's personal goals.       Expected Outcomes Short Term: Attend rehab on a regular basis to increase amount of physical activity.;Long Term: Add in home exercise to make exercise part of routine and to increase amount of physical activity.;Long Term: Exercising regularly at least 3-5 days a week.       Increase Strength and Stamina Yes       Intervention Provide advice, education, support and counseling about physical activity/exercise needs.;Develop an individualized exercise prescription for aerobic and resistive training based on initial evaluation findings, risk stratification, comorbidities and participant's personal goals.       Expected Outcomes Short Term: Increase workloads from initial exercise prescription for resistance, speed, and METs.;Short Term: Perform resistance training exercises routinely during rehab  and add in resistance training at home;Long Term: Improve cardiorespiratory fitness, muscular endurance and strength as measured by increased METs and functional capacity ( )       Able to understand and use rate of perceived exertion (RPE) scale Yes       Intervention Provide education and explanation on how to use RPE scale       Expected Outcomes Short Term: Able to use RPE daily in rehab to express subjective intensity level;Long Term:  Able to use RPE to guide intensity level when exercising independently       Able to understand and use Dyspnea scale Yes       Intervention Provide education and explanation on how to use Dyspnea scale       Expected Outcomes Short Term: Able to use Dyspnea scale daily in rehab to express subjective sense of shortness of breath during exertion;Long Term: Able to use Dyspnea scale to guide intensity level when exercising independently       Knowledge and understanding of Target Heart Rate Range (THRR) Yes       Intervention Provide education and explanation of THRR including how the numbers were predicted and where they are located for reference       Expected Outcomes Short Term: Able to state/look up THRR;Short Term: Able to use daily as guideline for intensity in rehab;Long Term: Able to use THRR to govern intensity when exercising independently  Able to check pulse independently Yes       Intervention Provide education and demonstration on how to check pulse in carotid and radial arteries.;Review the importance of being able to check your own pulse for safety during independent exercise       Expected Outcomes Short Term: Able to explain why pulse checking is important during independent exercise;Long Term: Able to check pulse independently and accurately       Understanding of Exercise Prescription Yes       Intervention Provide education, explanation, and written materials on patient's individual exercise prescription       Expected Outcomes Short  Term: Able to explain program exercise prescription;Long Term: Able to explain home exercise prescription to exercise independently                Exercise Goals Re-Evaluation :  Exercise Goals Re-Evaluation     Row Name 04/21/23 0932 05/04/23 1134 05/16/23 1549 06/01/23 1447 06/07/23 1007     Exercise Goal Re-Evaluation   Exercise Goals Review Able to understand and use rate of perceived exertion (RPE) scale;Able to understand and use Dyspnea scale;Knowledge and understanding of Target Heart Rate Range (THRR);Understanding of Exercise Prescription Increase Physical Activity;Increase Strength and Stamina;Understanding of Exercise Prescription Increase Physical Activity;Increase Strength and Stamina;Understanding of Exercise Prescription Increase Physical Activity;Increase Strength and Stamina;Understanding of Exercise Prescription Increase Physical Activity;Able to understand and use Dyspnea scale;Understanding of Exercise Prescription;Increase Strength and Stamina;Knowledge and understanding of Target Heart Rate Range (THRR);Able to check pulse independently;Able to understand and use rate of perceived exertion (RPE) scale   Comments Reviewed RPE and dyspnea scale, THR and program prescription with pt today.  Pt voiced understanding and was given a copy of goals to take home. Xaiden is off to a good start in the program. He was able to increase his treadmill workload up to a speed of 1.5 mph with no incline, and walked up to 26 laps on the track. He also did well on the biostep at level 1 and the T4 nustep at level 2. We will continue to monitor his progress in the program. Solan is doing well in the program. He recently increased his treadmill speed to 2.1 mph with no incline. He also improved to level 3 on the T4 nustep and level 2 on the biostep. We will continue to monitor his progress in the program. Legacy continues to do well in rehab. He has increased to level 5 on the T4 nustep and level 3 on  the biostep. He did the same modality for his aerobic exercise, either the T4 nustep or biostep. We will discuss and encourge adding in different modalities to add more variety to his exercise. We will continue to monitor (his/her) progress in the program. Reviewed home exercise with pt today.  Pt plans to use the [employer] [facility]/gym for aerobic machines and dumbbells for resistance 1-2 days at home for exercise.  Reviewed THR, pulse, RPE, sign and symptoms, pulse oximetery and when to call 911 or MD.  Also discussed weather considerations and indoor options.  Pt voiced understanding.   Expected Outcomes Short: Use RPE daily to regulate intensity. Long: Follow program prescription in THR. Short: Continue to follow current exercise prescription. Long: Continue exercise to improve strength and stamina. Short: Continue to progressively increase treadmill workload. Long: Continue exercise to improve strength and stamina. Short: Add back in different modalities to exercise prescription and continue to progressively increase. Long: Continue exercise to improve strength and stamina.  Short: Add 2 additional days of home exercise. Long: Continue to exercise at home independently.    Row Name 06/15/23 1012 06/28/23 1423           Exercise Goal Re-Evaluation   Exercise Goals Review Increase Physical Activity;Increase Strength and Stamina;Understanding of Exercise Prescription Increase Physical Activity;Increase Strength and Stamina;Understanding of Exercise Prescription      Comments Nnamdi is doing well in rehab. He recently began using the treadmill again at a speed of 1.8 mph with no incline. He also improved to level 4 on the biostep and XR, and was able to do 30 minutes of continuous exercise on both machines. We will continue to monitor his progress in the program. Tonnie continues to do well in rehab. He recently improved to level 4 on the T4 nustep and level 5 on the biostep. He has not done any  walking in the program since the last review. We will continue to monitor his progress in the program.      Expected Outcomes Short: Continue to progressively increase treadmill workload. Long: Continue exercise to improve strength and stamina. Short: Return to walking the treadmill in rehab. Long: Continue exercise to improve strength and stamina.               Discharge Exercise Prescription (Final Exercise Prescription Changes):  Exercise Prescription Changes - 06/28/23 1400       Response to Exercise   Blood Pressure (Admit) 112/66    Blood Pressure (Exit) 118/64    Heart Rate (Admit) 69 bpm    Heart Rate (Exercise) 110 bpm    Heart Rate (Exit) 85 bpm    Rating of Perceived Exertion (Exercise) 13    Symptoms none    Duration Continue with 30 min of aerobic exercise without signs/symptoms of physical distress.    Intensity THRR unchanged      Progression   Progression Continue to progress workloads to maintain intensity without signs/symptoms of physical distress.    Average METs 3.57      Resistance Training   Training Prescription Yes    Weight 4 lb    Reps 10-15      Interval Training   Interval Training No      NuStep   Level 4    Minutes 30    METs 4.7      Biostep-RELP   Level 5    Minutes 30    METs 3      Home Exercise Plan   Plans to continue exercise at [employer] (comment)   [employer]/gym for aerobic machines and dumbbells   Frequency Add 2 additional days to program exercise sessions.    Initial Home Exercises Provided 06/07/23      Oxygen   Maintain Oxygen Saturation 88% or higher             Nutrition:  Target Goals: Understanding of nutrition guidelines, daily intake of sodium 1500mg , cholesterol 200mg , calories 30% from fat and 7% or less from saturated fats, daily to have 5 or more servings of fruits and vegetables.  Education: All About Nutrition: -Group instruction provided by verbal, written material,  interactive activities, discussions, models, and posters to present general guidelines for heart healthy nutrition including fat, fiber, MyPlate, the role of sodium in heart healthy nutrition, utilization of the nutrition label, and utilization of this knowledge for meal planning. Follow up email sent as well. Written material given at graduation.   Biometrics:  Pre Biometrics -  04/14/23 1130       Pre Biometrics   Height 5\' 7"  (1.702 m)    Weight 260 lb 4.8 oz (118.1 kg)    Waist Circumference 48 inches    Hip Circumference 54 inches    Waist to Hip Ratio 0.89 %    BMI (Calculated) 40.76    Single Leg Stand 2 seconds              Nutrition Therapy Plan and Nutrition Goals:  Nutrition Therapy & Goals - 05/31/23 1116       Nutrition Therapy   Diet Cardiac, Low Na    Protein (specify units) 75-90    Fiber 30 grams    Whole Grain Foods 3 servings    Saturated Fats 15 max. grams    Fruits and Vegetables 5 servings/day    Sodium 2 grams      Personal Nutrition Goals   Nutrition Goal Drink more water, ~32-64oz per day, less alcohol and milk    Personal Goal #2 Read labels and reduce sodium intake to below 2300mg . Ideally 1500mg  per day.    Personal Goal #3 Eat 15-30gProtein and 30-60gCarbs at each meal hour    Comments Patient not drinking much water, but drinks decaf coffee, milk, alcoholic drinks. Discussed cutting back on some of these beverages to fit more water in. He agrees it is a good goal and will try to increase his water intake. He reports he doesn't eat fried foods or salt his foods either. Commended him on this, reviewed a few facts labels and educated that most sodium in americans diets come from food already high in sodium rather than salting at table. Set goal to reviewed facts labels for foods he eats often and make sure he is eating less than 1500mg  sodium per day. He rarely eats lunch. Discussed the importance of meeting nutritional goals and how eating only 2  meals per day can make meeting these goals difficult. Set goal to use nutrient and protein rich foods as snacks during missed meals. Reviewed mediterranean diet handout, educated on types of fats, sources, and how to read labels.      Intervention Plan   Intervention Prescribe, educate and counsel regarding individualized specific dietary modifications aiming towards targeted core components such as weight, hypertension, lipid management, diabetes, heart failure and other comorbidities.;Nutrition handout(s) given to patient.    Expected Outcomes Short Term Goal: Understand basic principles of dietary content, such as calories, fat, sodium, cholesterol and nutrients.;Short Term Goal: A plan has been developed with personal nutrition goals set during dietitian appointment.;Long Term Goal: Adherence to prescribed nutrition plan.             Nutrition Assessments:  MEDIFICTS Score Key: >=70 Need to make dietary changes  40-70 Heart Healthy Diet <= 40 Therapeutic Level Cholesterol Diet   Picture Your Plate Scores: <24 Unhealthy dietary pattern with much room for improvement. 41-50 Dietary pattern unlikely to meet recommendations for good health and room for improvement. 51-60 More healthful dietary pattern, with some room for improvement.  >60 Healthy dietary pattern, although there may be some specific behaviors that could be improved.    Nutrition Goals Re-Evaluation:  Nutrition Goals Re-Evaluation     Row Name 05/26/23 [phone]             Goals   Nutrition Goal Schedule appointment with RD       Comment Loris would like to schedule an appointment with RD.  Expected Outcome Short: Meet with RD. Long: adhere to a diet that pertains to him.                Nutrition Goals Discharge (Final Nutrition Goals Re-Evaluation):  Nutrition Goals Re-Evaluation - 05/26/23 0926       Goals   Nutrition Goal Schedule appointment with RD    Comment Damier would like to schedule an  appointment with RD.    Expected Outcome Short: Meet with RD. Long: adhere to a diet that pertains to him.             Psychosocial: Target Goals: Acknowledge presence or absence of significant depression and/or stress, maximize coping skills, provide positive support system. Participant is able to verbalize types and ability to use techniques and skills needed for reducing stress and depression.   Education: Stress, Anxiety, and Depression - Group verbal and visual presentation to define topics covered.  Reviews how body is impacted by stress, anxiety, and depression.  Also discusses healthy ways to reduce stress and to treat/manage anxiety and depression.  Written material given at graduation.   Education: Sleep Hygiene -Provides group verbal and written instruction about how sleep can affect your health.  Define sleep hygiene, discuss sleep cycles and impact of sleep habits. Review good sleep hygiene tips.    Initial Review & Psychosocial Screening:  Initial Psych Review & Screening - 04/11/23 1123       Initial Review   Current issues with Current Sleep Concerns;Current Stress Concerns    Source of Stress Concerns Chronic Illness      Family Dynamics   Good Support System? Yes    Comments Andres sleeps with a CPAP but he has not been able to use it since he gets up alot and cant sleep enough to use it. He can look to his wife for support.      Barriers   Psychosocial barriers to participate in program The patient should benefit from training in stress management and relaxation.      Screening Interventions   Interventions Encouraged to exercise;To provide support and resources with identified psychosocial needs;Provide feedback about the scores to participant    Expected Outcomes Short Term goal: Utilizing psychosocial counselor, staff and physician to assist with identification of specific Stressors or current issues interfering with healing process. Setting desired goal for  each stressor or current issue identified.;Long Term Goal: Stressors or current issues are controlled or eliminated.;Short Term goal: Identification and review with participant of any Quality of Life or Depression concerns found by scoring the questionnaire.;Long Term goal: The participant improves quality of Life and PHQ9 Scores as seen by post scores and/or verbalization of changes             Quality of Life Scores:   Scores of 19 and below usually indicate a poorer quality of life in these areas.  A difference of  2-3 points is a clinically meaningful difference.  A difference of 2-3 points in the total score of the Quality of Life Index has been associated with significant improvement in overall quality of life, self-image, physical symptoms, and general health in studies assessing change in quality of life.  PHQ-9: Review Flowsheet  More data may exist      05/26/2023 04/14/2023 03/15/2023 06/01/2022 10/17/2020  Depression screen PHQ 2/9  Decreased Interest 0 1 0 3 0  Down, Depressed, Hopeless 0 0 0 3 0  PHQ - 2 Score 0 1 0 6 0  Altered sleeping  2 2 - 3 3  Tired, decreased energy 3 1 - 3 3  Change in appetite 0 0 - 2 0  Feeling bad or failure about yourself  0 0 - 2 1  Trouble concentrating 0 1 - 3 1  Moving slowly or fidgety/restless 0 0 - 3 1  Suicidal thoughts 0 0 - 0 0  PHQ-9 Score 5 5 - 22 9  Difficult doing work/chores Not difficult at all Somewhat difficult - Extremely dIfficult Somewhat difficult   Interpretation of Total Score  Total Score Depression Severity:  1-4 = Minimal depression, 5-9 = Mild depression, 10-14 = Moderate depression, 15-19 = Moderately severe depression, 20-27 = Severe depression   Psychosocial Evaluation and Intervention:  Psychosocial Evaluation - 04/11/23 1125       Psychosocial Evaluation & Interventions   Interventions Relaxation education;Stress management education;Encouraged to exercise with the program and follow exercise prescription     Comments Keiandre sleeps with a CPAP but he has not been able to use it since he gets up alot and cant sleep enough to use it. He can look to his wife for support.    Expected Outcomes Short: Start [facility] to help with mood. Long: Maintain a healthy mental state    Continue Psychosocial Services  Follow up required by staff             Psychosocial Re-Evaluation:  Psychosocial Re-Evaluation     Row Name 05/26/23 [phone]             Psychosocial Re-Evaluation   Current issues with Current Stress Concerns;Current Sleep Concerns       Comments Reviewed patient health questionnaire (PHQ-9) with patient for follow up. Previously, patients score indicated signs/symptoms of depression.  Reviewed to see if patient is improving symptom wise while in program. Score stayed the same and patient states that it is because he does not sleep well and has a lack of energy.       Expected Outcomes Short: Continue to work toward an improvement in PHQ9 scores by attending [facility] regularly. Long: Continue to improve stress and depression coping skills by talking with staff and attending [facility] regularly and work toward a positive mental state.       Interventions Encouraged to attend Cardiac Rehabilitation for the exercise       Continue Psychosocial Services  Follow up required by staff                Psychosocial Discharge (Final Psychosocial Re-Evaluation):  Psychosocial Re-Evaluation - 05/26/23 0933       Psychosocial Re-Evaluation   Current issues with Current Stress Concerns;Current Sleep Concerns    Comments Reviewed patient health questionnaire (PHQ-9) with patient for follow up. Previously, patients score indicated signs/symptoms of depression.  Reviewed to see if patient is improving symptom wise while in program. Score stayed the same and patient states that it is because he does not sleep well and has a lack of energy.    Expected Outcomes Short: Continue to work toward an  improvement in PHQ9 scores by attending [facility] regularly. Long: Continue to improve stress and depression coping skills by talking with staff and attending [facility] regularly and work toward a positive mental state.    Interventions Encouraged to attend Cardiac Rehabilitation for the exercise    Continue Psychosocial Services  Follow up required by staff             Vocational Rehabilitation: Provide vocational rehab assistance to qualifying candidates.  Vocational Rehab Evaluation & Intervention:   Education: Education Goals: Education classes will be provided on a variety of topics geared toward better understanding of heart health and risk factor modification. Participant will state understanding/return demonstration of topics presented as noted by education test scores.  Learning Barriers/Preferences:  Learning Barriers/Preferences - 04/11/23 1123       Learning Barriers/Preferences   Learning Barriers None    Learning Preferences None             General Cardiac Education Topics:  AED/CPR: - Group verbal and written instruction with the use of models to demonstrate the basic use of the AED with the basic ABC's of resuscitation.   Anatomy and Cardiac Procedures: - Group verbal and visual presentation and models provide information about basic cardiac anatomy and function. Reviews the testing methods done to diagnose heart disease and the outcomes of the test results. Describes the treatment choices: Medical Management, Angioplasty, or Coronary Bypass Surgery for treating various heart conditions including Myocardial Infarction, Angina, Valve Disease, and Cardiac Arrhythmias.  Written material given at graduation.   Medication Safety: - Group verbal and visual instruction to review commonly prescribed medications for heart and lung disease. Reviews the medication, class of the drug, and side effects. Includes the steps to properly store meds and maintain the  prescription regimen.  Written material given at graduation.   Intimacy: - Group verbal instruction through game format to discuss how heart and lung disease can affect sexual intimacy. Written material given at graduation..   Know Your Numbers and Heart Failure: - Group verbal and visual instruction to discuss disease risk factors for cardiac and pulmonary disease and treatment options.  Reviews associated critical values for Overweight/Obesity, Hypertension, Cholesterol, and Diabetes.  Discusses basics of heart failure: signs/symptoms and treatments.  Introduces Heart Failure Zone chart for action plan for heart failure.  Written material given at graduation.   Infection Prevention: - Provides verbal and written material to individual with discussion of infection control including proper hand washing and proper equipment cleaning during exercise session. Flowsheet Row Cardiac Rehab from 04/14/2023 in [facility] Cardiac and Pulmonary Rehab  Date 04/14/23  Educator MB  Instruction Review Code 1- Verbalizes Understanding       Falls Prevention: - Provides verbal and written material to individual with discussion of falls prevention and safety. Flowsheet Row Cardiac Rehab from 04/14/2023 in [facility] Cardiac and Pulmonary Rehab  Date 04/14/23  Educator MB  Instruction Review Code 1- Verbalizes Understanding       Other: -Provides group and verbal instruction on various topics (see comments)   Knowledge Questionnaire Score:   Core Components/Risk Factors/Patient Goals at Admission:  Personal Goals and Risk Factors at Admission - 04/14/23 1132       Core Components/Risk Factors/Patient Goals on Admission    Weight Management Yes;Weight Loss    Intervention Weight Management: Develop a combined nutrition and exercise program designed to reach desired caloric intake, while maintaining appropriate intake of nutrient and fiber, sodium and fats, and appropriate energy expenditure required for  the weight goal.;Weight Management: Provide education and appropriate resources to help participant work on and attain dietary goals.;Weight Management/Obesity: Establish reasonable short term and long term weight goals.    Admit Weight 260 lb 4.8 oz (118.1 kg)    Goal Weight: Short Term 255 lb (115.7 kg)    Goal Weight: Long Term 250 lb (113.4 kg)    Expected Outcomes Long Term: Adherence to nutrition and physical activity/exercise program aimed toward  attainment of established weight goal;Short Term: Continue to assess and modify interventions until short term weight is achieved;Weight Loss: Understanding of general recommendations for a balanced deficit meal plan, which promotes 1-2 lb weight loss per week and includes a negative energy balance of [phone] kcal/d;Understanding recommendations for meals to include 15-35% energy as protein, 25-35% energy from fat, 35-60% energy from carbohydrates, less than 200mg  of dietary cholesterol, 20-35 gm of total fiber daily;Understanding of distribution of calorie intake throughout the day with the consumption of 4-5 meals/snacks    Heart Failure Yes    Intervention Provide a combined exercise and nutrition program that is supplemented with education, support and counseling about heart failure. Directed toward relieving symptoms such as shortness of breath, decreased exercise tolerance, and extremity edema.    Expected Outcomes Improve functional capacity of life;Short term: Attendance in program 2-3 days a week with increased exercise capacity. Reported lower sodium intake. Reported increased fruit and vegetable intake. Reports medication compliance.;Short term: Daily weights obtained and reported for increase. Utilizing diuretic protocols set by physician.    Hypertension Yes    Intervention Provide education on lifestyle modifcations including regular physical activity/exercise, weight management, moderate sodium restriction and increased consumption of fresh  fruit, vegetables, and low fat dairy, alcohol moderation, and smoking cessation.;Monitor prescription use compliance.    Expected Outcomes Short Term: Continued assessment and intervention until BP is < 140/63mm HG in hypertensive participants. < 130/25mm HG in hypertensive participants with diabetes, heart failure or chronic kidney disease.;Long Term: Maintenance of blood pressure at goal levels.             Education:Diabetes - Individual verbal and written instruction to review signs/symptoms of diabetes, desired ranges of glucose level fasting, after meals and with exercise. Acknowledge that pre and post exercise glucose checks will be done for 3 sessions at entry of program.   Core Components/Risk Factors/Patient Goals Review:   Goals and Risk Factor Review     Row Name 05/26/23 0928             Core Components/Risk Factors/Patient Goals Review   Personal Goals Review Weight Management/Obesity       Review Shawnte wants to lose weight and reach a 240 pounds weight goal. He has been working hard in the program and is wanting to improve his overall health. He has a realistic goal of wanting to lose 20 pounds in 4-6 months.       Expected Outcomes Short: lose a few pounds in the next couple weeks. Long: reach weight goal.                Core Components/Risk Factors/Patient Goals at Discharge (Final Review):   Goals and Risk Factor Review - 05/26/23 0928       Core Components/Risk Factors/Patient Goals Review   Personal Goals Review Weight Management/Obesity    Review Kavion wants to lose weight and reach a 240 pounds weight goal. He has been working hard in the program and is wanting to improve his overall health. He has a realistic goal of wanting to lose 20 pounds in 4-6 months.    Expected Outcomes Short: lose a few pounds in the next couple weeks. Long: reach weight goal.             ITP Comments:  ITP Comments     Row Name 04/11/23 1128 04/14/23 1122 04/21/23  0932 05/11/23 1452 06/08/23 0752   ITP Comments Virtual Visit completed. Patient informed on EP and RD appointment  and 6 Minute walk test. Patient also informed of patient health questionnaires on My Chart. Patient Verbalizes understanding. Visit diagnosis can be found in [facility] 02/01/[facility]. Completed and gym orientation. Initial ITP created and sent for review to Dr. [name], Medical Director. First full day of exercise!  Patient was oriented to gym and equipment including functions, settings, policies, and procedures.  Patient's individual exercise prescription and treatment plan were reviewed.  All starting workloads were established based on the results of the 6 minute walk test done at initial orientation visit.  The plan for exercise progression was also introduced and progression will be customized based on patient's performance and goals. 30 Day review completed. Medical Director ITP review done, changes made as directed, and signed approval by Medical Director. 30 Day review completed. Medical Director ITP review done, changes made as directed, and signed approval by Medical Director.    Row Name 07/06/23 0732           ITP Comments 30 Day review completed. Medical Director ITP review done, changes made as directed, and signed approval by Medical Director.                Comments: 30 Day review completed. Medical Director ITP review done, changes made as directed, and signed approval by Medical Director.

## 2023-07-07 ENCOUNTER — Encounter: Payer: Medicare Other | Admitting: *Deleted

## 2023-07-07 DIAGNOSIS — Z952 Presence of prosthetic heart valve: Secondary | ICD-10-CM

## 2023-07-07 DIAGNOSIS — Z48812 Encounter for surgical aftercare following surgery on the circulatory system: Secondary | ICD-10-CM | POA: Diagnosis not present

## 2023-07-07 NOTE — Progress Notes (Signed)
 Daily Session Note  Patient Details  Name: John Wolf MRN: [id number] Date of Birth: 04/11/1956 Referring Provider:   Flowsheet Row Cardiac Rehab from 04/14/2023 in [facility] Cardiac and Pulmonary Rehab  Referring Provider [name], MD       Encounter Date: 07/07/2023  Check In:  Session Check In - 07/07/23 0947       Check-In   Supervising physician immediately available to respond to emergencies See telemetry face sheet for immediately available ER MD    Location [facility]-Cardiac & Pulmonary Rehab    Staff Present Cora Collum, RN, BSN, CCRP;Margaret Best, MS, Exercise Physiologist;Joseph Reino Kent RCP,RRT,BSRT    Virtual Visit No    Medication changes reported     No    Fall or balance concerns reported    No    Warm-up and Cool-down Performed on first and last piece of equipment    Resistance Training Performed Yes    VAD Patient? No    PAD/SET Patient? No      Pain Assessment   Currently in Pain? No/denies                Social History   Tobacco Use  Smoking Status Never  Smokeless Tobacco Never    Goals Met:  Independence with exercise equipment Exercise tolerated well No report of concerns or symptoms today  Goals Unmet:  Not Applicable  Comments: Pt able to follow exercise prescription today without complaint.  Will continue to monitor for progression.    Dr. [name] is Medical Director for [facility] Cardiac Rehabilitation.  Dr. [name] is Medical Director for [facility] Pulmonary Rehabilitation.

## 2023-07-12 ENCOUNTER — Encounter: Payer: Medicare Other | Admitting: *Deleted

## 2023-07-12 DIAGNOSIS — Z952 Presence of prosthetic heart valve: Secondary | ICD-10-CM

## 2023-07-12 DIAGNOSIS — Z48812 Encounter for surgical aftercare following surgery on the circulatory system: Secondary | ICD-10-CM | POA: Diagnosis not present

## 2023-07-12 NOTE — Progress Notes (Signed)
 Daily Session Note  Patient Details  Name: John Wolf MRN: [id number] Date of Birth: April 09, 1956 Referring Provider:   Flowsheet Row Cardiac Rehab from 04/14/2023 in [facility] Cardiac and Pulmonary Rehab  Referring Provider [name], MD       Encounter Date: 07/12/2023  Check In:  Session Check In - 07/12/23 0930       Check-In   Supervising physician immediately available to respond to emergencies See telemetry face sheet for immediately available ER MD    Location [facility]-Cardiac & Pulmonary Rehab    Staff Present Balinda Quails RDN,LDN;[name], BS, Exercise Physiologist;Charmion Hapke, RN, BSN, CCRP;Margaret Best, MS, Exercise Physiologist    Virtual Visit No    Medication changes reported     No    Fall or balance concerns reported    No    Warm-up and Cool-down Performed on first and last piece of equipment    Resistance Training Performed Yes    VAD Patient? No    PAD/SET Patient? No      Pain Assessment   Currently in Pain? No/denies                Social History   Tobacco Use  Smoking Status Never  Smokeless Tobacco Never    Goals Met:  Independence with exercise equipment Exercise tolerated well No report of concerns or symptoms today  Goals Unmet:  Not Applicable  Comments: Pt able to follow exercise prescription today without complaint.  Will continue to monitor for progression.    Dr. [name] is Medical Director for [facility] Cardiac Rehabilitation.  Dr. [name] is Medical Director for [facility] Pulmonary Rehabilitation.

## 2023-07-14 ENCOUNTER — Encounter: Payer: Medicare Other | Admitting: *Deleted

## 2023-07-14 DIAGNOSIS — Z952 Presence of prosthetic heart valve: Secondary | ICD-10-CM

## 2023-07-14 DIAGNOSIS — Z48812 Encounter for surgical aftercare following surgery on the circulatory system: Secondary | ICD-10-CM | POA: Diagnosis not present

## 2023-07-14 NOTE — Progress Notes (Signed)
 Daily Session Note  Patient Details  Name: John Wolf MRN: [id number] Date of Birth: 1956-01-26 Referring Provider:   Flowsheet Row Cardiac Rehab from 04/14/2023 in [facility] Cardiac and Pulmonary Rehab  Referring Provider [name], MD       Encounter Date: 07/14/2023  Check In:  Session Check In - 07/14/23 0934       Check-In   Supervising physician immediately available to respond to emergencies See telemetry face sheet for immediately available ER MD    Location [facility]-Cardiac & Pulmonary Rehab    Staff Present Balinda Quails RDN,LDN;Joseph Hollace Kinnier;Cora Collum, RN, BSN, CCRP;Margaret Best, MS, Exercise Physiologist    Virtual Visit No    Medication changes reported     No    Fall or balance concerns reported    No    Warm-up and Cool-down Performed on first and last piece of equipment    Resistance Training Performed Yes    VAD Patient? No    PAD/SET Patient? No      Pain Assessment   Currently in Pain? No/denies                Social History   Tobacco Use  Smoking Status Never  Smokeless Tobacco Never    Goals Met:  Independence with exercise equipment Exercise tolerated well No report of concerns or symptoms today  Goals Unmet:  Not Applicable  Comments: Pt able to follow exercise prescription today without complaint.  Will continue to monitor for progression.    Dr. [name] is Medical Director for [facility] Cardiac Rehabilitation.  Dr. [name] is Medical Director for [facility] Pulmonary Rehabilitation.

## 2023-07-15 ENCOUNTER — Other Ambulatory Visit: Payer: Self-pay | Admitting: *Deleted

## 2023-07-15 DIAGNOSIS — D500 Iron deficiency anemia secondary to blood loss (chronic): Secondary | ICD-10-CM

## 2023-07-18 ENCOUNTER — Inpatient Hospital Stay: Payer: Medicare Other | Attending: Oncology

## 2023-07-19 ENCOUNTER — Encounter: Payer: Medicare Other | Admitting: *Deleted

## 2023-07-19 ENCOUNTER — Inpatient Hospital Stay: Payer: Medicare Other | Admitting: Oncology

## 2023-07-19 ENCOUNTER — Inpatient Hospital Stay: Payer: Medicare Other

## 2023-07-19 DIAGNOSIS — Z48812 Encounter for surgical aftercare following surgery on the circulatory system: Secondary | ICD-10-CM | POA: Diagnosis not present

## 2023-07-19 DIAGNOSIS — Z952 Presence of prosthetic heart valve: Secondary | ICD-10-CM

## 2023-07-19 NOTE — Progress Notes (Signed)
 Daily Session Note  Patient Details  Name: John Wolf MRN: [id number] Date of Birth: 1955/05/31 Referring Provider:   Flowsheet Row Cardiac Rehab from 04/14/2023 in [facility] Cardiac and Pulmonary Rehab  Referring Provider John Roach, MD       Encounter Date: 07/19/2023  Check In:  Session Check In - 07/19/23 0919       Check-In   Supervising physician immediately available to respond to emergencies See telemetry face sheet for immediately available ER MD    Location [facility]-Cardiac & Pulmonary Rehab    Staff Present [name], MS, Exercise Physiologist;[name] BS, Exercise Physiologist;[name], BS, Exercise Physiologist;[name], RN, BSN, CCRP    Virtual Visit No    Medication changes reported     No    Fall or balance concerns reported    No    Warm-up and Cool-down Performed on first and last piece of equipment    Resistance Training Performed Yes    VAD Patient? No    PAD/SET Patient? No      Pain Assessment   Currently in Pain? No/denies                Social History   Tobacco Use  Smoking Status Never  Smokeless Tobacco Never    Goals Met:  Independence with exercise equipment Exercise tolerated well No report of concerns or symptoms today  Goals Unmet:  Not Applicable  Comments: Pt able to follow exercise prescription today without complaint.  Will continue to monitor for progression.    Dr. [name] is Medical Director for [facility] Cardiac Rehabilitation.  Dr. [name] is Medical Director for [facility] Pulmonary Rehabilitation.

## 2023-07-21 ENCOUNTER — Encounter: Payer: Medicare Other | Admitting: *Deleted

## 2023-07-21 DIAGNOSIS — Z952 Presence of prosthetic heart valve: Secondary | ICD-10-CM

## 2023-07-21 DIAGNOSIS — Z48812 Encounter for surgical aftercare following surgery on the circulatory system: Secondary | ICD-10-CM | POA: Diagnosis not present

## 2023-07-21 NOTE — Progress Notes (Signed)
 Daily Session Note  Patient Details  Name: John Wolf MRN: [id number] Date of Birth: 1956/03/24 Referring Provider:   Flowsheet Row Cardiac Rehab from 04/14/2023 in [facility] Cardiac and Pulmonary Rehab  Referring Provider [name], MD       Encounter Date: 07/21/2023  Check In:  Session Check In - 07/21/23 0946       Check-In   Supervising physician immediately available to respond to emergencies See telemetry face sheet for immediately available ER MD    Location [facility]-Cardiac & Pulmonary Rehab    Staff Present [name], MS, Exercise Physiologist;Aram Domzalski, RN, BSN, CCRP;Joseph Hood RCP,RRT,BSRT;[name] BS, Exercise Physiologist    Virtual Visit No    Medication changes reported     No    Fall or balance concerns reported    No    Warm-up and Cool-down Performed on first and last piece of equipment    Resistance Training Performed Yes    VAD Patient? No    PAD/SET Patient? No      Pain Assessment   Currently in Pain? No/denies    Multiple Pain Sites No                Social History   Tobacco Use  Smoking Status Never  Smokeless Tobacco Never    Goals Met:  Independence with exercise equipment Exercise tolerated well No report of concerns or symptoms today  Goals Unmet:  Not Applicable  Comments: Pt able to follow exercise prescription today without complaint.  Will continue to monitor for progression.    Dr. [name] is Medical Director for [facility] Cardiac Rehabilitation.  Dr. [name] is Medical Director for [facility] Pulmonary Rehabilitation.

## 2023-07-26 ENCOUNTER — Encounter: Payer: Medicare Other | Attending: Family Medicine | Admitting: *Deleted

## 2023-07-26 DIAGNOSIS — Z952 Presence of prosthetic heart valve: Secondary | ICD-10-CM | POA: Diagnosis present

## 2023-07-26 NOTE — Progress Notes (Signed)
 Daily Session Note  Patient Details  Name: John Wolf MRN: [id number] Date of Birth: October 11, 1955 Referring Provider:   Flowsheet Row Cardiac Rehab from 04/14/2023 in [facility] Cardiac and Pulmonary Rehab  Referring Provider [name], MD       Encounter Date: 07/26/2023  Check In:  Session Check In - 07/26/23 0920       Check-In   Supervising physician immediately available to respond to emergencies See telemetry face sheet for immediately available ER MD    Location [facility]-Cardiac & Pulmonary Rehab    Staff Present [name], MS, Exercise Physiologist;Emillia Weatherly, RN, BSN, CCRP;[name], BS, Exercise Physiologist;[name] BS, Exercise Physiologist    Virtual Visit No    Medication changes reported     No    Fall or balance concerns reported    No    Warm-up and Cool-down Performed on first and last piece of equipment    Resistance Training Performed Yes    VAD Patient? No    PAD/SET Patient? No      Pain Assessment   Currently in Pain? No/denies                Social History   Tobacco Use  Smoking Status Never  Smokeless Tobacco Never    Goals Met:  Independence with exercise equipment Exercise tolerated well No report of concerns or symptoms today  Goals Unmet:  Not Applicable  Comments: Pt able to follow exercise prescription today without complaint.  Will continue to monitor for progression.    Dr. [name] is Medical Director for [facility] Cardiac Rehabilitation.  Dr. [name] is Medical Director for [facility] Pulmonary Rehabilitation.

## 2023-07-28 ENCOUNTER — Encounter: Payer: Medicare Other | Admitting: *Deleted

## 2023-07-28 DIAGNOSIS — Z952 Presence of prosthetic heart valve: Secondary | ICD-10-CM

## 2023-07-28 NOTE — Progress Notes (Signed)
 Daily Session Note  Patient Details  Name: John Wolf MRN: [id number] Date of Birth: 05/15/1955 Referring Provider:   Flowsheet Row Cardiac Rehab from 04/14/2023 in [facility] Cardiac and Pulmonary Rehab  Referring Provider [name], MD       Encounter Date: 07/28/2023  Check In:  Session Check In - 07/28/23 0951       Check-In   Supervising physician immediately available to respond to emergencies See telemetry face sheet for immediately available ER MD    Location [facility]-Cardiac & Pulmonary Rehab    Staff Present Cora Collum, RN, BSN, CCRP;Joseph Hood RCP,RRT,BSRT;Margaret Best, MS, Exercise Physiologist;Jason Wallace Cullens RDN,LDN    Virtual Visit No    Medication changes reported     No    Fall or balance concerns reported    No    Warm-up and Cool-down Performed on first and last piece of equipment    Resistance Training Performed Yes    VAD Patient? No    PAD/SET Patient? No      Pain Assessment   Currently in Pain? No/denies                Social History   Tobacco Use  Smoking Status Never  Smokeless Tobacco Never    Goals Met:  Independence with exercise equipment Exercise tolerated well No report of concerns or symptoms today  Goals Unmet:  Not Applicable  Comments: Pt able to follow exercise prescription today without complaint.  Will continue to monitor for progression.    Dr. [name] is Medical Director for [facility] Cardiac Rehabilitation.  Dr. [name] is Medical Director for [facility] Pulmonary Rehabilitation.

## 2023-08-02 ENCOUNTER — Encounter: Payer: Medicare Other | Admitting: *Deleted

## 2023-08-02 DIAGNOSIS — Z952 Presence of prosthetic heart valve: Secondary | ICD-10-CM | POA: Diagnosis not present

## 2023-08-02 NOTE — Progress Notes (Signed)
 Daily Session Note  Patient Details  Name: John Wolf MRN: [id number] Date of Birth: [birth year] Referring Provider:   Flowsheet Row Cardiac Rehab from 04/14/2023 in [facility] Cardiac and Pulmonary Rehab  Referring Provider [name], MD       Encounter Date: 08/02/2023  Check In:  Session Check In - 08/02/23 0930       Check-In   Supervising physician immediately available to respond to emergencies See telemetry face sheet for immediately available ER MD    Location [facility]-Cardiac & Pulmonary Rehab    Staff Present [name], MS, Exercise Physiologist;[name], BS, Exercise Physiologist;[name] BS, Exercise Physiologist;Peightyn Roberson, RN, BSN, CCRP    Virtual Visit No    Medication changes reported     No    Fall or balance concerns reported    No    Warm-up and Cool-down Performed on first and last piece of equipment    Resistance Training Performed Yes    VAD Patient? No    PAD/SET Patient? No      Pain Assessment   Currently in Pain? No/denies                Social History   Tobacco Use  Smoking Status Never  Smokeless Tobacco Never    Goals Met:  Independence with exercise equipment Exercise tolerated well No report of concerns or symptoms today  Goals Unmet:  Not Applicable  Comments: Pt able to follow exercise prescription today without complaint.  Will continue to monitor for progression.    Dr. [name] is Medical Director for [facility] Cardiac Rehabilitation.  Dr. [name] is Medical Director for [facility] Pulmonary Rehabilitation.

## 2023-08-03 ENCOUNTER — Encounter: Payer: Self-pay | Admitting: *Deleted

## 2023-08-03 DIAGNOSIS — Z952 Presence of prosthetic heart valve: Secondary | ICD-10-CM

## 2023-08-03 NOTE — Progress Notes (Signed)
 Cardiac Individual Treatment Plan  Patient Details  Name: KEOKI MCHARGUE MRN: [id number] Date of Birth: 13-Sep-1955 Referring Provider:   Flowsheet Row Cardiac Rehab from 04/14/2023 in [facility] Cardiac and Pulmonary Rehab  Referring Provider [name], MD       Initial Encounter Date:  Flowsheet Row Cardiac Rehab from 04/14/2023 in [facility] Cardiac and Pulmonary Rehab  Date 04/14/23       Visit Diagnosis: S/P AVR (aortic valve replacement)  Patient's Home Medications on Admission:  Current Outpatient Medications:    ALPRAZolam (XANAX) 0.25 MG tablet, Take 1 tablet (0.25 mg total) by mouth at bedtime as needed for anxiety. Or insomnia, Disp: 30 tablet, Rfl: 0   amiodarone (PACERONE) 200 MG tablet, Take 1 tablet (200 mg total) by mouth daily., Disp: 90 tablet, Rfl: 1   apixaban (ELIQUIS) 5 MG TABS tablet, Take 1 tablet (5 mg total) by mouth 2 (two) times daily., Disp: 60 tablet, Rfl: 5   atorvastatin (LIPITOR) 80 MG tablet, Take 1 tablet (80 mg total) by mouth daily., Disp: 90 tablet, Rfl: 1   furosemide (LASIX) 40 MG tablet, TAKE ONE TABLET BY MOUTH ONCE DAILY, Disp: 90 tablet, Rfl: 1   Melatonin 1 MG CAPS, Take by mouth., Disp: , Rfl:    metoprolol tartrate (LOPRESSOR) 25 MG tablet, Take 1 tablet (25 mg total) by mouth 2 (two) times daily., Disp: 180 tablet, Rfl: 1   ondansetron (ZOFRAN-ODT) 4 MG disintegrating tablet, Take 1 tablet (4 mg total) by mouth every 8 (eight) hours as needed for nausea or vomiting., Disp: 20 tablet, Rfl: 0   pantoprazole (PROTONIX) 40 MG tablet, Take 1 tablet (40 mg total) by mouth daily., Disp: 90 tablet, Rfl: 3   spironolactone (ALDACTONE) 25 MG tablet, Take 1 tablet (25 mg total) by mouth daily., Disp: 90 tablet, Rfl: 1   sulfamethoxazole-trimethoprim (BACTRIM DS) 800-160 MG tablet, Take 1 tablet by mouth 2 (two) times daily., Disp: 10 tablet, Rfl: 0   traZODone (DESYREL) 50 MG tablet, Take 0.5-1 tablets (25-50 mg total) by mouth at bedtime as needed for  sleep., Disp: 90 tablet, Rfl: 3  Past Medical History: Past Medical History:  Diagnosis Date   Acquired pes planus    Arthritis    Benign neoplasm of colon    History of left knee replacement    Hyperlipidemia    Hypertension    Obesity (BMI 30-39.9)    with 70 lb weight loss, ongoing   Onychia and paronychia of toe    Sleep apnea, obstructive 04/26/2000   wears CPAP    Tobacco Use: Social History   Tobacco Use  Smoking Status Never  Smokeless Tobacco Never    Labs: Review Flowsheet  More data exists      Latest Ref Rng & Units 06/16/2018 05/25/2019 08/01/2020 03/10/2021 06/01/2022  Labs for ITP Cardiac and Pulmonary Rehab  Cholestrol <200 mg/dL - - 829  562  130   LDL (calc) mg/dL (calc) - - 865  86  78   Direct LDL mg/dL 784.6  - - - -  HDL-C > OR = 40 mg/dL - - 96.29  52.84  70   Trlycerides <150 mg/dL - - 132.4  40.1  92   Hemoglobin A1c 4.6 - 6.5 % 5.2  5.3  - 4.7  -     Exercise Target Goals: Exercise Program Goal: Individual exercise prescription set using results from initial 6 min walk test and THRR while considering  patient's activity barriers and safety.  Exercise Prescription Goal: Initial exercise prescription builds to 30-45 minutes a day of aerobic activity, 2-3 days per week.  Home exercise guidelines will be given to patient during program as part of exercise prescription that the participant will acknowledge.   Education: Aerobic Exercise: - Group verbal and visual presentation on the components of exercise prescription. Introduces F.I.T.T principle from ACSM for exercise prescriptions.  Reviews F.I.T.T. principles of aerobic exercise including progression. Written material given at graduation.   Education: Resistance Exercise: - Group verbal and visual presentation on the components of exercise prescription. Introduces F.I.T.T principle from ACSM for exercise prescriptions  Reviews F.I.T.T. principles of resistance exercise including progression.  Written material given at graduation.    Education: Exercise & Equipment Safety: - Individual verbal instruction and demonstration of equipment use and safety with use of the equipment. Flowsheet Row Cardiac Rehab from 04/14/2023 in [facility] Cardiac and Pulmonary Rehab  Date 04/14/23  Educator MB  Instruction Review Code 1- Verbalizes Understanding       Education: Exercise Physiology & General Exercise Guidelines: - Group verbal and written instruction with models to review the exercise physiology of the cardiovascular system and associated critical values. Provides general exercise guidelines with specific guidelines to those with heart or lung disease.    Education: Flexibility, Balance, Mind/Body Relaxation: - Group verbal and visual presentation with interactive activity on the components of exercise prescription. Introduces F.I.T.T principle from ACSM for exercise prescriptions. Reviews F.I.T.T. principles of flexibility and balance exercise training including progression. Also discusses the mind body connection.  Reviews various relaxation techniques to help reduce and manage stress (i.e. Deep breathing, progressive muscle relaxation, and visualization). Balance handout provided to take home. Written material given at graduation.   Activity Barriers & Risk Stratification:  Activity Barriers & Cardiac Risk Stratification - 04/14/23 1124       Activity Barriers & Cardiac Risk Stratification   Activity Barriers Right Knee Replacement;Left Knee Replacement;Other (comment)    Comments R foot reconstruction, L shoulder replacement, lifting restriction    Cardiac Risk Stratification High             6 Minute Walk:  6 Minute Walk     Row Name 04/14/23 1122         6 Minute Walk   Phase Initial     Distance 930 feet     Walk Time 6 minutes     # of Rest Breaks 0     MPH 1.76     METS 1.8     RPE 11     Perceived Dyspnea  1     VO2 Peak 6.31     Symptoms No     Resting  HR 96 bpm     Resting BP 100/78     Resting Oxygen Saturation  96 %     Exercise Oxygen Saturation  during 6 min walk 98 %     Max Ex. HR 101 bpm     Max Ex. BP 136/84     2 Minute Post BP 120/86              Oxygen Initial Assessment:   Oxygen Re-Evaluation:   Oxygen Discharge (Final Oxygen Re-Evaluation):   Initial Exercise Prescription:  Initial Exercise Prescription - 04/14/23 1100       Date of Initial Exercise RX and Referring Provider   Date 04/14/23    Referring Provider [name], MD      Oxygen   Maintain Oxygen Saturation 88%  or higher      Treadmill   MPH 1.2    Grade 0    Minutes 15    METs 1.92      NuStep   Level 1    SPM 80    Minutes 15    METs 1.8      Biostep-RELP   Level 1    SPM 50    Minutes 15    METs 1.8      Track   Laps 17    Minutes 15    METs 1.92      Prescription Details   Frequency (times per week) 2    Duration Progress to 30 minutes of continuous aerobic without signs/symptoms of physical distress      Intensity   THRR 40-80% of Max Heartrate 119-143    Ratings of Perceived Exertion 11-13    Perceived Dyspnea 0-4      Progression   Progression Continue to progress workloads to maintain intensity without signs/symptoms of physical distress.      Resistance Training   Training Prescription Yes    Weight 3 lb    Reps 10-15             Perform Capillary Blood Glucose checks as needed.  Exercise Prescription Changes:   Exercise Prescription Changes     Row Name 04/14/23 1100 05/04/23 1100 05/16/23 1500 06/01/23 1400 06/07/23 1000     Response to Exercise   Blood Pressure (Admit) 100/78 120/82 118/70 116/64 --   Blood Pressure (Exercise) 136/84 138/78 138/74 142/74 --   Blood Pressure (Exit) 120/86 122/72 108/64 118/60 --   Heart Rate (Admit) 96 bpm 102 bpm 97 bpm 76 bpm --   Heart Rate (Exercise) 101 bpm 129 bpm 126 bpm 103 bpm --   Heart Rate (Exit) 100 bpm 91 bpm 105 bpm 72 bpm --   Oxygen  Saturation (Admit) 96 % -- -- -- --   Oxygen Saturation (Exercise) 98 % -- -- -- --   Oxygen Saturation (Exit) 97 % -- -- -- --   Rating of Perceived Exertion (Exercise) 11 13 13 13  --   Perceived Dyspnea (Exercise) 1 -- -- -- --   Symptoms none none none none --   Comments results First three days of exercise -- -- --   Duration Progress to 30 minutes of  aerobic without signs/symptoms of physical distress Progress to 30 minutes of  aerobic without signs/symptoms of physical distress Progress to 30 minutes of  aerobic without signs/symptoms of physical distress Continue with 30 min of aerobic exercise without signs/symptoms of physical distress. --   Intensity THRR New THRR unchanged THRR unchanged THRR unchanged --     Progression   Progression Continue to progress workloads to maintain intensity without signs/symptoms of physical distress. Continue to progress workloads to maintain intensity without signs/symptoms of physical distress. Continue to progress workloads to maintain intensity without signs/symptoms of physical distress. Continue to progress workloads to maintain intensity without signs/symptoms of physical distress. --   Average METs 1.8 2.83 2.91 3.92 --     Resistance Training   Training Prescription -- Yes Yes Yes --   Weight -- 3 lb 3 lb 3 lb --   Reps -- 10-15 10-15 10-15 --     Interval Training   Interval Training -- No No No --     Treadmill   MPH -- 1.5 2.1 -- --   Grade -- 0 0 -- --  Minutes -- 15 15 -- --   METs -- 2.15 2.61 -- --     NuStep   Level -- 2 3 5  --   Minutes -- 15 15 30  --   METs -- 3.2 3.3 5 --     Biostep-RELP   Level -- 1 2 3  --   Minutes -- 15 15 30  --   METs -- -- 3 3 --     Track   Laps -- 26 -- -- --   Minutes -- 15 -- -- --   METs -- 2.41 -- -- --     Home Exercise Plan   Plans to continue exercise at -- -- -- -- [employer] (comment)  [employer]/gym for aerobic machines and dumbbells   Frequency  -- -- -- -- Add 2 additional days to program exercise sessions.   Initial Home Exercises Provided -- -- -- -- 06/07/23     Oxygen   Maintain Oxygen Saturation -- 88% or higher 88% or higher 88% or higher --    Row Name 06/15/23 1000 06/28/23 1400 07/12/23 1300 07/26/23 1400       Response to Exercise   Blood Pressure (Admit) 114/66 112/66 104/60 120/84    Blood Pressure (Exit) 118/64 118/64 128/80 112/70    Heart Rate (Admit) 75 bpm 69 bpm 75 bpm 77 bpm    Heart Rate (Exercise) 106 bpm 110 bpm 98 bpm 103 bpm    Heart Rate (Exit) 79 bpm 85 bpm 88 bpm 78 bpm    Rating of Perceived Exertion (Exercise) 14 13 13 15     Symptoms none none none none    Duration Continue with 30 min of aerobic exercise without signs/symptoms of physical distress. Continue with 30 min of aerobic exercise without signs/symptoms of physical distress. Continue with 30 min of aerobic exercise without signs/symptoms of physical distress. Continue with 30 min of aerobic exercise without signs/symptoms of physical distress.    Intensity THRR unchanged THRR unchanged THRR unchanged THRR unchanged      Progression   Progression Continue to progress workloads to maintain intensity without signs/symptoms of physical distress. Continue to progress workloads to maintain intensity without signs/symptoms of physical distress. Continue to progress workloads to maintain intensity without signs/symptoms of physical distress. Continue to progress workloads to maintain intensity without signs/symptoms of physical distress.    Average METs 4.09 3.57 4.63 4.41      Resistance Training   Training Prescription Yes Yes Yes Yes    Weight 4 lb 4 lb 4 lb 5 lb    Reps 10-15 10-15 10-15 10-15      Interval Training   Interval Training No No No No      Treadmill   MPH 1.8 -- -- --    Grade 0 -- -- --    Minutes 15 -- -- --    METs 2.38 -- -- --      NuStep   Level 3 4 5 5     Minutes 15 30 30 30     METs 4.7 4.7 5.5 5.6      REL-XR    Level 4 -- -- --    Minutes 30 -- -- --    METs 5.5 -- -- --      Biostep-RELP   Level 4 5 5 6     Minutes 30 30 30 30     METs 4 3 5 4       Home Exercise Plan   Plans to  continue exercise at [employer] (comment)  [employer]/gym for aerobic machines and Public librarian (comment)  [employer]/gym for [employer] and Public librarian (comment)  [employer]/gym for [employer] and Public librarian (comment)  [employer]/gym for aerobic machines and dumbbells    Frequency Add 2 additional days to program exercise sessions. Add 2 additional days to program exercise sessions. Add 2 additional days to program exercise sessions. Add 2 additional days to program exercise sessions.    Initial Home Exercises Provided 06/07/23 06/07/23 06/07/23 06/07/23      Oxygen   Maintain Oxygen Saturation 88% or higher 88% or higher 88% or higher 88% or higher             Exercise Comments:   Exercise Comments     Row Name 04/21/23 0932           Exercise Comments First full day of exercise!  Patient was oriented to gym and equipment including functions, settings, policies, and procedures.  Patient's individual exercise prescription and treatment plan were reviewed.  All starting workloads were established based on the results of the 6 minute walk test done at initial orientation visit.  The plan for exercise progression was also introduced and progression will be customized based on patient's performance and goals.                Exercise Goals and Review:   Exercise Goals     Row Name 04/14/23 1129             Exercise Goals   Increase Physical Activity Yes       Intervention Provide advice, education, support and counseling about physical activity/exercise needs.;Develop an individualized exercise prescription for aerobic and resistive training based on initial  evaluation findings, risk stratification, comorbidities and participant's personal goals.       Expected Outcomes Short Term: Attend rehab on a regular basis to increase amount of physical activity.;Long Term: Add in home exercise to make exercise part of routine and to increase amount of physical activity.;Long Term: Exercising regularly at least 3-5 days a week.       Increase Strength and Stamina Yes       Intervention Provide advice, education, support and counseling about physical activity/exercise needs.;Develop an individualized exercise prescription for aerobic and resistive training based on initial evaluation findings, risk stratification, comorbidities and participant's personal goals.       Expected Outcomes Short Term: Increase workloads from initial exercise prescription for resistance, speed, and METs.;Short Term: Perform resistance training exercises routinely during rehab and add in resistance training at home;Long Term: Improve cardiorespiratory fitness, muscular endurance and strength as measured by increased METs and functional capacity ( )       Able to understand and use rate of perceived exertion (RPE) scale Yes       Intervention Provide education and explanation on how to use RPE scale       Expected Outcomes Short Term: Able to use RPE daily in rehab to express subjective intensity level;Long Term:  Able to use RPE to guide intensity level when exercising independently       Able to understand and use Dyspnea scale Yes       Intervention Provide education and explanation on how to use Dyspnea scale       Expected Outcomes Short Term: Able to use Dyspnea scale daily in rehab to express subjective sense of shortness of  breath during exertion;Long Term: Able to use Dyspnea scale to guide intensity level when exercising independently       Knowledge and understanding of Target Heart Rate Range (THRR) Yes       Intervention Provide education and explanation of THRR including how  the numbers were predicted and where they are located for reference       Expected Outcomes Short Term: Able to state/look up THRR;Short Term: Able to use daily as guideline for intensity in rehab;Long Term: Able to use THRR to govern intensity when exercising independently       Able to check pulse independently Yes       Intervention Provide education and demonstration on how to check pulse in carotid and radial arteries.;Review the importance of being able to check your own pulse for safety during independent exercise       Expected Outcomes Short Term: Able to explain why pulse checking is important during independent exercise;Long Term: Able to check pulse independently and accurately       Understanding of Exercise Prescription Yes       Intervention Provide education, explanation, and written materials on patient's individual exercise prescription       Expected Outcomes Short Term: Able to explain program exercise prescription;Long Term: Able to explain home exercise prescription to exercise independently                Exercise Goals Re-Evaluation :  Exercise Goals Re-Evaluation     Row Name 04/21/23 0932 05/04/23 1134 05/16/23 1549 06/01/23 1447 06/07/23 1007     Exercise Goal Re-Evaluation   Exercise Goals Review Able to understand and use rate of perceived exertion (RPE) scale;Able to understand and use Dyspnea scale;Knowledge and understanding of Target Heart Rate Range (THRR);Understanding of Exercise Prescription Increase Physical Activity;Increase Strength and Stamina;Understanding of Exercise Prescription Increase Physical Activity;Increase Strength and Stamina;Understanding of Exercise Prescription Increase Physical Activity;Increase Strength and Stamina;Understanding of Exercise Prescription Increase Physical Activity;Able to understand and use Dyspnea scale;Understanding of Exercise Prescription;Increase Strength and Stamina;Knowledge and understanding of Target Heart Rate  Range (THRR);Able to check pulse independently;Able to understand and use rate of perceived exertion (RPE) scale   Comments Reviewed RPE and dyspnea scale, THR and program prescription with pt today.  Pt voiced understanding and was given a copy of goals to take home. Macedonio is off to a good start in the program. He was able to increase his treadmill workload up to a speed of 1.5 mph with no incline, and walked up to 26 laps on the track. He also did well on the biostep at level 1 and the T4 nustep at level 2. We will continue to monitor his progress in the program. Barry is doing well in the program. He recently increased his treadmill speed to 2.1 mph with no incline. He also improved to level 3 on the T4 nustep and level 2 on the biostep. We will continue to monitor his progress in the program. Jamin continues to do well in rehab. He has increased to level 5 on the T4 nustep and level 3 on the biostep. He did the same modality for his aerobic exercise, either the T4 nustep or biostep. We will discuss and encourge adding in different modalities to add more variety to his exercise. We will continue to monitor (his/her) progress in the program. Reviewed home exercise with pt today.  Pt plans to use the [employer] [facility]/gym for aerobic machines and dumbbells for resistance 1-2 days at  home for exercise.  Reviewed THR, pulse, RPE, sign and symptoms, pulse oximetery and when to call 911 or MD.  Also discussed weather considerations and indoor options.  Pt voiced understanding.   Expected Outcomes Short: Use RPE daily to regulate intensity. Long: Follow program prescription in THR. Short: Continue to follow current exercise prescription. Long: Continue exercise to improve strength and stamina. Short: Continue to progressively increase treadmill workload. Long: Continue exercise to improve strength and stamina. Short: Add back in different modalities to exercise prescription and continue to progressively  increase. Long: Continue exercise to improve strength and stamina. Short: Add 2 additional days of home exercise. Long: Continue to exercise at home independently.    Row Name 06/15/23 1012 06/28/23 1423 07/12/23 1354 07/19/23 0943 07/26/23 1422     Exercise Goal Re-Evaluation   Exercise Goals Review Increase Physical Activity;Increase Strength and Stamina;Understanding of Exercise Prescription Increase Physical Activity;Increase Strength and Stamina;Understanding of Exercise Prescription Increase Physical Activity;Increase Strength and Stamina;Understanding of Exercise Prescription Increase Physical Activity;Increase Strength and Stamina;Understanding of Exercise Prescription Increase Physical Activity;Increase Strength and Stamina;Understanding of Exercise Prescription   Comments Rahkim is doing well in rehab. He recently began using the treadmill again at a speed of 1.8 mph with no incline. He also improved to level 4 on the biostep and XR, and was able to do 30 minutes of continuous exercise on both machines. We will continue to monitor his progress in the program. Tammie continues to do well in rehab. He recently improved to level 4 on the T4 nustep and level 5 on the biostep. He has not done any walking in the program since the last review. We will continue to monitor his progress in the program. Keston continues to do well in rehab. He recently improved to level 5 on the T4 nustep and continues to work at level 5 on the biostep. He has not done any walking in the program since the last review. We will continue to monitor his progress in the program. Derric is doing well at rehab. He is on Biostep level 5. Spoke with him about adding more intentional exercise outside of rehab to prepare for when he graduates from the program. Raymie is doing well in rehab. He improved to level 6 on the biostep and continued to work at level 5 on the T4 nustep. He also increased from 4 lb to 5 lb hand weights for resistance  training as well. We will continue to monitor his progress in the program.   Expected Outcomes Short: Continue to progressively increase treadmill workload. Long: Continue exercise to improve strength and stamina. Short: Return to walking the treadmill in rehab. Long: Continue exercise to improve strength and stamina. Short: Return to walking the treadmill in rehab. Long: Continue exercise to improve strength and stamina. STG: increase workload and add exercise at home. LTG: Continue to improve strength and stamina Short: Continue to progressively increase workloads. Long: Continue exercise to improve strength and stamina.            Discharge Exercise Prescription (Final Exercise Prescription Changes):  Exercise Prescription Changes - 07/26/23 1400       Response to Exercise   Blood Pressure (Admit) 120/84    Blood Pressure (Exit) 112/70    Heart Rate (Admit) 77 bpm    Heart Rate (Exercise) 103 bpm    Heart Rate (Exit) 78 bpm    Rating of Perceived Exertion (Exercise) 15    Symptoms none    Duration Continue with 30  min of aerobic exercise without signs/symptoms of physical distress.    Intensity THRR unchanged      Progression   Progression Continue to progress workloads to maintain intensity without signs/symptoms of physical distress.    Average METs 4.41      Resistance Training   Training Prescription Yes    Weight 5 lb    Reps 10-15      Interval Training   Interval Training No      NuStep   Level 5    Minutes 30    METs 5.6      Biostep-RELP   Level 6    Minutes 30    METs 4      Home Exercise Plan   Plans to continue exercise at [employer] (comment)   [employer]/gym for aerobic machines and dumbbells   Frequency Add 2 additional days to program exercise sessions.    Initial Home Exercises Provided 06/07/23      Oxygen   Maintain Oxygen Saturation 88% or higher             Nutrition:  Target Goals: Understanding of  nutrition guidelines, daily intake of sodium 1500mg , cholesterol 200mg , calories 30% from fat and 7% or less from saturated fats, daily to have 5 or more servings of fruits and vegetables.  Education: All About Nutrition: -Group instruction provided by verbal, written material, interactive activities, discussions, models, and posters to present general guidelines for heart healthy nutrition including fat, fiber, MyPlate, the role of sodium in heart healthy nutrition, utilization of the nutrition label, and utilization of this knowledge for meal planning. Follow up email sent as well. Written material given at graduation.   Biometrics:  Pre Biometrics - 04/14/23 1130       Pre Biometrics   Height 5\' 7"  (1.702 m)    Weight 260 lb 4.8 oz (118.1 kg)    Waist Circumference 48 inches    Hip Circumference 54 inches    Waist to Hip Ratio 0.89 %    BMI (Calculated) 40.76    Single Leg Stand 2 seconds              Nutrition Therapy Plan and Nutrition Goals:  Nutrition Therapy & Goals - 05/31/23 1116       Nutrition Therapy   Diet Cardiac, Low Na    Protein (specify units) 75-90    Fiber 30 grams    Whole Grain Foods 3 servings    Saturated Fats 15 max. grams    Fruits and Vegetables 5 servings/day    Sodium 2 grams      Personal Nutrition Goals   Nutrition Goal Drink more water, ~32-64oz per day, less alcohol and milk    Personal Goal #2 Read labels and reduce sodium intake to below 2300mg . Ideally 1500mg  per day.    Personal Goal #3 Eat 15-30gProtein and 30-60gCarbs at each meal hour    Comments Patient not drinking much water, but drinks decaf coffee, milk, alcoholic drinks. Discussed cutting back on some of these beverages to fit more water in. He agrees it is a good goal and will try to increase his water intake. He reports he doesn't eat fried foods or salt his foods either. Commended him on this, reviewed a few facts labels and educated that most sodium in americans diets  come from food already high in sodium rather than salting at table. Set goal to reviewed facts labels for foods he eats often and make  sure he is eating less than 1500mg  sodium per day. He rarely eats lunch. Discussed the importance of meeting nutritional goals and how eating only 2 meals per day can make meeting these goals difficult. Set goal to use nutrient and protein rich foods as snacks during missed meals. Reviewed mediterranean diet handout, educated on types of fats, sources, and how to read labels.      Intervention Plan   Intervention Prescribe, educate and counsel regarding individualized specific dietary modifications aiming towards targeted core components such as weight, hypertension, lipid management, diabetes, heart failure and other comorbidities.;Nutrition handout(s) given to patient.    Expected Outcomes Short Term Goal: Understand basic principles of dietary content, such as calories, fat, sodium, cholesterol and nutrients.;Short Term Goal: A plan has been developed with personal nutrition goals set during dietitian appointment.;Long Term Goal: Adherence to prescribed nutrition plan.             Nutrition Assessments:  MEDIFICTS Score Key: >=70 Need to make dietary changes  40-70 Heart Healthy Diet <= 40 Therapeutic Level Cholesterol Diet   Picture Your Plate Scores: <16 Unhealthy dietary pattern with much room for improvement. 41-50 Dietary pattern unlikely to meet recommendations for good health and room for improvement. 51-60 More healthful dietary pattern, with some room for improvement.  >60 Healthy dietary pattern, although there may be some specific behaviors that could be improved.    Nutrition Goals Re-Evaluation:  Nutrition Goals Re-Evaluation     Row Name 05/26/23 [phone] 07/19/23 0454           Goals   Nutrition Goal Schedule appointment with RD --      Comment Kiron would like to schedule an appointment with RD. Romney is still working on drinking  more water. 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 Encouraged him to read labels and keep sodium intake low, below 1500mg  daily would be ideal.      Expected Outcome Short: Meet with RD. Long: adhere to a diet that pertains to him. STG: drink 32-64oz of water and reduce sodium to below 1500mg  daily. LTG: Follow a heart healthy diet               Nutrition Goals Discharge (Final Nutrition Goals Re-Evaluation):  Nutrition Goals Re-Evaluation - 07/19/23 0937       Goals   Comment Rubin is still working on drinking more water. 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 Encouraged him to read labels and keep sodium intake low, below 1500mg  daily would be ideal.    Expected Outcome STG: drink 32-64oz of water and reduce sodium to below 1500mg  daily. LTG: Follow a heart healthy diet             Psychosocial: Target Goals: Acknowledge presence or absence of significant depression and/or stress, maximize coping skills, provide positive support system. Participant is able to verbalize types and ability to use techniques and skills needed for reducing stress and depression.   Education: Stress, Anxiety, and Depression - Group verbal and visual presentation to define topics covered.  Reviews how body is impacted by stress, anxiety, and depression.  Also discusses healthy ways to reduce stress and to treat/manage anxiety and depression.  Written material given at graduation.   Education: Sleep Hygiene -Provides group verbal and written instruction about how sleep can affect your health.  Define sleep  hygiene, discuss sleep cycles and impact of sleep habits. Review good sleep hygiene tips.    Initial Review & Psychosocial Screening:  Initial Psych Review & Screening - 04/11/23 1123       Initial Review   Current issues with Current Sleep Concerns;Current Stress  Concerns    Source of Stress Concerns Chronic Illness      Family Dynamics   Good Support System? Yes    Comments Clebert sleeps with a CPAP but he has not been able to use it since he gets up alot and cant sleep enough to use it. He can look to his wife for support.      Barriers   Psychosocial barriers to participate in program The patient should benefit from training in stress management and relaxation.      Screening Interventions   Interventions Encouraged to exercise;To provide support and resources with identified psychosocial needs;Provide feedback about the scores to participant    Expected Outcomes Short Term goal: Utilizing psychosocial counselor, staff and physician to assist with identification of specific Stressors or current issues interfering with healing process. Setting desired goal for each stressor or current issue identified.;Long Term Goal: Stressors or current issues are controlled or eliminated.;Short Term goal: Identification and review with participant of any Quality of Life or Depression concerns found by scoring the questionnaire.;Long Term goal: The participant improves quality of Life and PHQ9 Scores as seen by post scores and/or verbalization of changes             Quality of Life Scores:   Scores of 19 and below usually indicate a poorer quality of life in these areas.  A difference of  2-3 points is a clinically meaningful difference.  A difference of 2-3 points in the total score of the Quality of Life Index has been associated with significant improvement in overall quality of life, self-image, physical symptoms, and general health in studies assessing change in quality of life.  PHQ-9: Review Flowsheet  More data exists      07/19/2023 05/26/2023 04/14/2023 03/15/2023 06/01/2022  Depression screen PHQ 2/9  Decreased Interest 0 0 1 0 3  Down, Depressed, Hopeless 0 0 0 0 3  PHQ - 2 Score 0 0 1 0 6  Altered sleeping 2 2 2  - 3  Tired, decreased energy 2 3 1   - 3  Change in appetite 0 0 0 - 2  Feeling bad or failure about yourself  0 0 0 - 2  Trouble concentrating 0 0 1 - 3  Moving slowly or fidgety/restless 0 0 0 - 3  Suicidal thoughts 0 0 0 - 0  PHQ-9 Score 4 5 5  - 22  Difficult doing work/chores Somewhat difficult Not difficult at all Somewhat difficult - Extremely dIfficult   Interpretation of Total Score  Total Score Depression Severity:  1-4 = Minimal depression, 5-9 = Mild depression, 10-14 = Moderate depression, 15-19 = Moderately severe depression, 20-27 = Severe depression   Psychosocial Evaluation and Intervention:  Psychosocial Evaluation - 04/11/23 1125       Psychosocial Evaluation & Interventions   Interventions Relaxation education;Stress management education;Encouraged to exercise with the program and follow exercise prescription    Comments Carlous sleeps with a CPAP but he has not been able to use it since he gets up alot and cant sleep enough to use it. He can look to his wife for support.    Expected Outcomes Short: Start [facility] to help with mood. Long: Maintain a healthy  mental state    Continue Psychosocial Services  Follow up required by staff             Psychosocial Re-Evaluation:  Psychosocial Re-Evaluation     Row Name 05/26/23 0933 07/19/23 0934           Psychosocial Re-Evaluation   Current issues with Current Stress Concerns;Current Sleep Concerns Current Sleep Concerns      Comments Reviewed patient health questionnaire (PHQ-9) with patient for follow up. Previously, patients score indicated signs/symptoms of depression.  Reviewed to see if patient is improving symptom wise while in program. Score stayed the same and patient states that it is because he does not sleep well and has a lack of energy. Completed PHQ-9, score came down from 5 to 4. He doesnt sleep well and feels he could be doing more but his stamina is not good enough to do all he wants. He reports this can make him feel down at times,  but denies any anxiety or depression.      Expected Outcomes Short: Continue to work toward an improvement in PHQ9 scores by attending [facility] regularly. Long: Continue to improve stress and depression coping skills by talking with staff and attending [facility] regularly and work toward a positive mental state. STG: Continue to attend cardiac rehab. LTG: maintain a positive outlook on health and daily life      Interventions Encouraged to attend Cardiac Rehabilitation for the exercise Encouraged to attend Cardiac Rehabilitation for the exercise      Continue Psychosocial Services  Follow up required by staff Follow up required by staff        Initial Review   Source of Stress Concerns -- Chronic Illness               Psychosocial Discharge (Final Psychosocial Re-Evaluation):  Psychosocial Re-Evaluation - 07/19/23 0934       Psychosocial Re-Evaluation   Current issues with Current Sleep Concerns    Comments Completed PHQ-9, score came down from 5 to 4. He doesnt sleep well and feels he could be doing more but his stamina is not good enough to do all he wants. He reports this can make him feel down at times, but denies any anxiety or depression.    Expected Outcomes STG: Continue to attend cardiac rehab. LTG: maintain a positive outlook on health and daily life    Interventions Encouraged to attend Cardiac Rehabilitation for the exercise    Continue Psychosocial Services  Follow up required by staff      Initial Review   Source of Stress Concerns Chronic Illness             Vocational Rehabilitation: Provide vocational rehab assistance to qualifying candidates.   Vocational Rehab Evaluation & Intervention:   Education: Education Goals: Education classes will be provided on a variety of topics geared toward better understanding of heart health and risk factor modification. Participant will state understanding/return demonstration of topics presented as noted by education  test scores.  Learning Barriers/Preferences:  Learning Barriers/Preferences - 04/11/23 1123       Learning Barriers/Preferences   Learning Barriers None    Learning Preferences None             General Cardiac Education Topics:  AED/CPR: - Group verbal and written instruction with the use of models to demonstrate the basic use of the AED with the basic ABC's of resuscitation.   Anatomy and Cardiac Procedures: - Group verbal and visual presentation and models  provide information about basic cardiac anatomy and function. Reviews the testing methods done to diagnose heart disease and the outcomes of the test results. Describes the treatment choices: Medical Management, Angioplasty, or Coronary Bypass Surgery for treating various heart conditions including Myocardial Infarction, Angina, Valve Disease, and Cardiac Arrhythmias.  Written material given at graduation.   Medication Safety: - Group verbal and visual instruction to review commonly prescribed medications for heart and lung disease. Reviews the medication, class of the drug, and side effects. Includes the steps to properly store meds and maintain the prescription regimen.  Written material given at graduation.   Intimacy: - Group verbal instruction through game format to discuss how heart and lung disease can affect sexual intimacy. Written material given at graduation..   Know Your Numbers and Heart Failure: - Group verbal and visual instruction to discuss disease risk factors for cardiac and pulmonary disease and treatment options.  Reviews associated critical values for Overweight/Obesity, Hypertension, Cholesterol, and Diabetes.  Discusses basics of heart failure: signs/symptoms and treatments.  Introduces Heart Failure Zone chart for action plan for heart failure.  Written material given at graduation.   Infection Prevention: - Provides verbal and written material to individual with discussion of infection control  including proper hand washing and proper equipment cleaning during exercise session. Flowsheet Row Cardiac Rehab from [facility]14/2023 in [facility] Cardiac [facility]  Date 04/14/23  Educator MB  Instruction Review Code 1- Verbalizes Understanding       Falls Prevention: - Provides verbal and written material to individual with discussion of falls prevention and safety. Flowsheet Row Cardiac Rehab from 04/14/2023 in [facility] Cardiac and Pulmonary Rehab  Date 04/14/23  Educator MB  Instruction Review Code 1- Verbalizes Understanding       Other: -Provides group and verbal instruction on various topics (see comments)   Knowledge Questionnaire Score:   Core Components/Risk Factors/Patient Goals at Admission:  Personal Goals and Risk Factors at Admission - 04/14/23 1132       Core Components/Risk Factors/Patient Goals on Admission    Weight Management Yes;Weight Loss    Intervention Weight Management: Develop a combined nutrition and exercise program designed to reach desired caloric intake, while maintaining appropriate intake of nutrient and fiber, sodium and fats, and appropriate energy expenditure required for the weight goal.;Weight Management: Provide education and appropriate resources to help participant work on and attain dietary goals.;Weight Management/Obesity: Establish reasonable short term and long term weight goals.    Admit Weight 260 lb 4.8 oz (118.1 kg)    Goal Weight: Short Term 255 lb (115.7 kg)    Goal Weight: Long Term 250 lb (113.4 kg)    Expected Outcomes Long Term: Adherence to nutrition and physical activity/exercise program aimed toward attainment of established weight goal;Short Term: Continue to assess and modify interventions until short term weight is achieved;Weight Loss: Understanding of general recommendations for a balanced deficit meal plan, which promotes 1-2 lb weight loss per week and includes a negative energy balance of [phone] kcal/d;Understanding  recommendations for meals to include 15-35% energy as protein, 25-35% energy from fat, 35-60% energy from carbohydrates, less than 200mg  of dietary cholesterol, 20-35 gm of total fiber daily;Understanding of distribution of calorie intake throughout the day with the consumption of 4-5 meals/snacks    Heart Failure Yes    Intervention Provide a combined exercise and nutrition program that is supplemented with education, support and counseling about heart failure. Directed toward relieving symptoms such as shortness of breath, decreased exercise tolerance, and  extremity edema.    Expected Outcomes Improve functional capacity of life;Short term: Attendance in program 2-3 days a week with increased exercise capacity. Reported lower sodium intake. Reported increased fruit and vegetable intake. Reports medication compliance.;Short term: Daily weights obtained and reported for increase. Utilizing diuretic protocols set by physician.    Hypertension Yes    Intervention Provide education on lifestyle modifcations including regular physical activity/exercise, weight management, moderate sodium restriction and increased consumption of fresh fruit, vegetables, and low fat dairy, alcohol moderation, and smoking cessation.;Monitor prescription use compliance.    Expected Outcomes Short Term: Continued assessment and intervention until BP is < 140/11mm HG in hypertensive participants. < 130/34mm HG in hypertensive participants with diabetes, heart failure or chronic kidney disease.;Long Term: Maintenance of blood pressure at goal levels.             Education:Diabetes - Individual verbal and written instruction to review signs/symptoms of diabetes, desired ranges of glucose level fasting, after meals and with exercise. Acknowledge that pre and post exercise glucose checks will be done for 3 sessions at entry of program.   Core Components/Risk Factors/Patient Goals Review:   Goals and Risk Factor Review     Row  Name 05/26/23 5784 07/19/23 0941           Core Components/Risk Factors/Patient Goals Review   Personal Goals Review Weight Management/Obesity Weight Management/Obesity      Review Oval wants to lose weight and reach a 240 pounds weight goal. He has been working hard in the program and is wanting to improve his overall health. He has a realistic goal of wanting to lose 20 pounds in 4-6 months. Lenix is still working hard here at rehab, he is wanting to lost ~20lbs. Encouraged him to continue to attend rehab but also look to add more exercise at home. He says he has access to a gym and he likes working out on the exercise bikes.      Expected Outcomes Short: lose a few pounds in the next couple weeks. Long: reach weight goal. STG: lose 5% CBW. LTG: achieve and maintain a healthy weight               Core Components/Risk Factors/Patient Goals at Discharge (Final Review):   Goals and Risk Factor Review - 07/19/23 0941       Core Components/Risk Factors/Patient Goals Review   Personal Goals Review Weight Management/Obesity    Review Danni is still working hard here at rehab, he is wanting to lost ~20lbs. Encouraged him to continue to attend rehab but also look to add more exercise at home. He says he has access to a gym and he likes working out on the exercise bikes.    Expected Outcomes STG: lose 5% CBW. LTG: achieve and maintain a healthy weight             ITP Comments:  ITP Comments     Row Name 04/11/23 1128 04/14/23 1122 04/21/23 0932 05/11/23 1452 06/08/23 0752   ITP Comments Virtual Visit completed. Patient informed on EP and RD appointment and 6 Minute walk test. Patient also informed of patient health questionnaires on My Chart. Patient Verbalizes understanding. Visit diagnosis can be found in [facility] 02/01/2023. Completed and gym orientation. Initial ITP created and sent for review to Dr. [name], Medical Director. First full day of exercise!  Patient was oriented to  gym and equipment including functions, settings, policies, and procedures.  Patient's individual exercise prescription and treatment plan  were reviewed.  All starting workloads were established based on the results of the 6 minute walk test done at initial orientation visit.  The plan for exercise progression was also introduced and progression will be customized based on patient's performance and goals. 30 Day review completed. Medical Director ITP review done, changes made as directed, and signed approval by Medical Director. 30 Day review completed. Medical Director ITP review done, changes made as directed, and signed approval by Medical Director.    Row Name 07/06/23 0732 08/03/23 0934         ITP Comments 30 Day review completed. Medical Director ITP review done, changes made as directed, and signed approval by Medical Director. 30 Day review completed. Medical Director ITP review done, changes made as directed, and signed approval by Medical Director.               Comments:

## 2023-08-04 ENCOUNTER — Encounter: Payer: Medicare Other | Admitting: *Deleted

## 2023-08-04 VITALS — Ht 67.0 in | Wt 267.0 lb

## 2023-08-04 DIAGNOSIS — Z952 Presence of prosthetic heart valve: Secondary | ICD-10-CM

## 2023-08-04 NOTE — Progress Notes (Signed)
 Daily Session Note  Patient Details  Name: John Wolf MRN: [id number] Date of Birth: 1956-02-14 Referring Provider:   Flowsheet Row Cardiac Rehab from 04/14/2023 in [facility] Cardiac and Pulmonary Rehab  Referring Provider [name], MD       Encounter Date: 08/04/2023  Check In:  Session Check In - 08/04/23 0936       Check-In   Supervising physician immediately available to respond to emergencies See telemetry face sheet for immediately available ER MD    Location [facility]-Cardiac & Pulmonary Rehab    Staff Present Cora Collum, RN, BSN, CCRP;Joseph Hood RCP,RRT,BSRT;Maxon [place] BS, Exercise Physiologist;[name], BS, Exercise Physiologist    Virtual Visit No    Medication changes reported     No    Fall or balance concerns reported    No    Warm-up and Cool-down Performed on first and last piece of equipment    Resistance Training Performed Yes    VAD Patient? No    PAD/SET Patient? No      Pain Assessment   Currently in Pain? No/denies                Social History   Tobacco Use  Smoking Status Never  Smokeless Tobacco Never    Goals Met:  Independence with exercise equipment Exercise tolerated well No report of concerns or symptoms today  Goals Unmet:  Not Applicable  Comments: Pt able to follow exercise prescription today without complaint.  Will continue to monitor for progression.    Dr. [name] is Medical Director for [facility] Cardiac Rehabilitation.  Dr. [name] is Medical Director for [facility] Pulmonary Rehabilitation.

## 2023-08-04 NOTE — Patient Instructions (Signed)
 Discharge Patient Instructions  Patient Details  Name: John Wolf MRN: [id number] Date of Birth: January 05, 1956 Referring Provider:  Sherlene Shams, MD   Number of Visits: 50  Reason for Discharge:  Patient reached a stable level of exercise. Patient independent in their exercise. Patient has met program and personal goals.   Diagnosis:  S/P AVR (aortic valve replacement)  Initial Exercise Prescription:  Initial Exercise Prescription - 04/14/23 1100       Date of Initial Exercise RX and Referring Provider   Date 04/14/23    Referring Provider [name], MD      Oxygen   Maintain Oxygen Saturation 88% or higher      Treadmill   MPH 1.2    Grade 0    Minutes 15    METs 1.92      NuStep   Level 1    SPM 80    Minutes 15    METs 1.8      Biostep-RELP   Level 1    SPM 50    Minutes 15    METs 1.8      Track   Laps 17    Minutes 15    METs 1.92      Prescription Details   Frequency (times per week) 2    Duration Progress to 30 minutes of continuous aerobic without signs/symptoms of physical distress      Intensity   THRR 40-80% of Max Heartrate 119-143    Ratings of Perceived Exertion 11-13    Perceived Dyspnea 0-4      Progression   Progression Continue to progress workloads to maintain intensity without signs/symptoms of physical distress.      Resistance Training   Training Prescription Yes    Weight 3 lb    Reps 10-15             Discharge Exercise Prescription (Final Exercise Prescription Changes):  Exercise Prescription Changes - 07/26/23 1400       Response to Exercise   Blood Pressure (Admit) 120/84    Blood Pressure (Exit) 112/70    Heart Rate (Admit) 77 bpm    Heart Rate (Exercise) 103 bpm    Heart Rate (Exit) 78 bpm    Rating of Perceived Exertion (Exercise) 15    Symptoms none    Duration Continue with 30 min of aerobic exercise without signs/symptoms of physical distress.    Intensity THRR unchanged      Progression    Progression Continue to progress workloads to maintain intensity without signs/symptoms of physical distress.    Average METs 4.41      Resistance Training   Training Prescription Yes    Weight 5 lb    Reps 10-15      Interval Training   Interval Training No      NuStep   Level 5    Minutes 30    METs 5.6      Biostep-RELP   Level 6    Minutes 30    METs 4      Home Exercise Plan   Plans to continue exercise at [employer] (comment)   [employer]/gym for aerobic machines and dumbbells   Frequency Add 2 additional days to program exercise sessions.    Initial Home Exercises Provided 06/07/23      Oxygen   Maintain Oxygen Saturation 88% or higher             Functional Capacity:  6  Minute Walk     Row Name 04/14/23 1122 08/04/23 0931       6 Minute Walk   Phase Initial Discharge    Distance 930 feet 1215 feet    Distance % Change -- 30.6 %    Distance Feet Change -- 285 ft    Walk Time 6 minutes 6 minutes    # of Rest Breaks 0 0    MPH 1.76 2.3    METS 1.8 2.24    RPE 11 11    Perceived Dyspnea  1 0    VO2 Peak 6.31 7.8    Symptoms No No    Resting HR 96 bpm 74 bpm    Resting BP 100/78 102/62    Resting Oxygen Saturation  96 % 92 %    Exercise Oxygen Saturation  during 6 min walk 98 % 91 %    Max Ex. HR 101 bpm 101 bpm    Max Ex. BP 136/84 136/72    2 Minute Post BP 120/86 --             Nutrition & Weight - Outcomes:  Pre Biometrics - 04/14/23 1130       Pre Biometrics   Height 5\' 7"  (1.702 m)    Weight 260 lb 4.8 oz (118.1 kg)    Waist Circumference 48 inches    Hip Circumference 54 inches    Waist to Hip Ratio 0.89 %    BMI (Calculated) 40.76    Single Leg Stand 2 seconds             Post Biometrics - 08/04/23 0935        Post  Biometrics   Height 5\' 7"  (1.702 m)    Weight 267 lb (121.1 kg)    Waist Circumference 47 inches    Hip Circumference 50.5 inches    Waist to Hip Ratio 0.93 %    BMI  (Calculated) 41.81    Single Leg Stand 3 seconds

## 2023-08-09 ENCOUNTER — Encounter: Payer: Medicare Other | Admitting: *Deleted

## 2023-08-09 DIAGNOSIS — Z952 Presence of prosthetic heart valve: Secondary | ICD-10-CM

## 2023-08-09 NOTE — Progress Notes (Signed)
 Daily Session Note  Patient Details  Name: XHAIDEN COOMBS MRN: [id number] Date of Birth: April 05, 1956 Referring Provider:   Flowsheet Row Cardiac Rehab from 04/14/2023 in [facility] Cardiac and Pulmonary Rehab  Referring Provider Marilynn Shuck, MD       Encounter Date: 08/09/2023  Check In:  Session Check In - 08/09/23 0946       Check-In   Supervising physician immediately available to respond to emergencies See telemetry face sheet for immediately available ER MD    Location [facility]-Cardiac & Pulmonary Rehab    Staff Present Lyell Samuel, MS, Exercise Physiologist;[name] BS, Exercise Physiologist;[name], BS, Exercise Physiologist;Benny Deutschman, RN, BSN, CCRP    Virtual Visit No    Medication changes reported     No    Fall or balance concerns reported    No    Warm-up and Cool-down Performed on first and last piece of equipment    Resistance Training Performed Yes    VAD Patient? No    PAD/SET Patient? No      Pain Assessment   Currently in Pain? No/denies                Social History   Tobacco Use  Smoking Status Never  Smokeless Tobacco Never    Goals Met:  Independence with exercise equipment Exercise tolerated well No report of concerns or symptoms today  Goals Unmet:  Not Applicable  Comments: Pt able to follow exercise prescription today without complaint.  Will continue to monitor for progression.    Dr. Firman Hughes is Medical Director for [facility] Cardiac Rehabilitation.  Dr. Fuad Aleskerov is Medical Director for [facility] Pulmonary Rehabilitation.

## 2023-08-11 ENCOUNTER — Encounter: Payer: Medicare Other | Admitting: *Deleted

## 2023-08-11 DIAGNOSIS — Z952 Presence of prosthetic heart valve: Secondary | ICD-10-CM

## 2023-08-11 NOTE — Progress Notes (Signed)
 Daily Session Note  Patient Details  Name: John Wolf MRN: [id number] Date of Birth: 08/27/1955 Referring Provider:   Flowsheet Row Cardiac Rehab from 04/14/2023 in [facility] Cardiac and Pulmonary Rehab  Referring Provider Marilynn Shuck, MD       Encounter Date: 08/11/2023  Check In:  Session Check In - 08/11/23 0931       Check-In   Supervising physician immediately available to respond to emergencies See telemetry face sheet for immediately available ER MD    Location [facility]-Cardiac & Pulmonary Rehab    Staff Present Maud Sorenson, RN, BSN, CCRP;[name] BS, Exercise Physiologist;[name], BS, Exercise Physiologist;Jason Martina Sledge RDN,LDN    Virtual Visit No    Medication changes reported     No    Fall or balance concerns reported    No    Warm-up and Cool-down Performed on first and last piece of equipment    Resistance Training Performed Yes    VAD Patient? No    PAD/SET Patient? No      Pain Assessment   Currently in Pain? No/denies                Social History   Tobacco Use  Smoking Status Never  Smokeless Tobacco Never    Goals Met:  Independence with exercise equipment Exercise tolerated well No report of concerns or symptoms today  Goals Unmet:  Not Applicable  Comments: Pt able to follow exercise prescription today without complaint.  Will continue to monitor for progression.    Dr. Firman Hughes is Medical Director for [facility] Cardiac Rehabilitation.  Dr. Fuad Aleskerov is Medical Director for [facility] Pulmonary Rehabilitation.

## 2023-08-16 ENCOUNTER — Encounter: Admitting: *Deleted

## 2023-08-16 ENCOUNTER — Other Ambulatory Visit: Payer: Self-pay | Admitting: Internal Medicine

## 2023-08-16 DIAGNOSIS — Z952 Presence of prosthetic heart valve: Secondary | ICD-10-CM

## 2023-08-16 MED ORDER — AMIODARONE HCL 200 MG PO TABS: 200.0000 mg | ORAL_TABLET | Freq: Every day | ORAL | 1 refills | Status: DC

## 2023-08-16 MED ORDER — ATORVASTATIN CALCIUM 80 MG PO TABS: 80.0000 mg | ORAL_TABLET | Freq: Every day | ORAL | 1 refills | Status: DC

## 2023-08-16 MED ORDER — SPIRONOLACTONE 25 MG PO TABS: 25.0000 mg | ORAL_TABLET | Freq: Every day | ORAL | 1 refills | Status: AC

## 2023-08-16 NOTE — Progress Notes (Signed)
 Daily Session Note  Patient Details  Name: John Wolf MRN: [id number] Date of Birth: [birth year] Referring Provider:   Flowsheet Row Cardiac Rehab from 04/14/2023 in [facility] Cardiac and Pulmonary Rehab  Referring Provider Marilynn Shuck, MD       Encounter Date: 08/16/2023  Check In:  Session Check In - 08/16/23 0931       Check-In   Supervising physician immediately available to respond to emergencies See telemetry face sheet for immediately available ER MD    Location [facility]-Cardiac & Pulmonary Rehab    Staff Present Lyell Samuel, MS, Exercise Physiologist;[name] BS, Exercise Physiologist;[name], BS, Exercise Physiologist;Mao Lockner, RN, BSN, CCRP    Virtual Visit No    Medication changes reported     No    Fall or balance concerns reported    No    Warm-up and Cool-down Performed on first and last piece of equipment    Resistance Training Performed Yes    VAD Patient? No    PAD/SET Patient? No      Pain Assessment   Currently in Pain? No/denies                Social History   Tobacco Use  Smoking Status Never  Smokeless Tobacco Never    Goals Met:  Independence with exercise equipment Exercise tolerated well No report of concerns or symptoms today  Goals Unmet:  Not Applicable  Comments: Pt able to follow exercise prescription today without complaint.  Will continue to monitor for progression.    Dr. Firman Hughes is Medical Director for [facility] Cardiac Rehabilitation.  Dr. Fuad Aleskerov is Medical Director for [facility] Pulmonary Rehabilitation.

## 2023-08-18 ENCOUNTER — Encounter: Admitting: *Deleted

## 2023-08-18 DIAGNOSIS — Z952 Presence of prosthetic heart valve: Secondary | ICD-10-CM | POA: Diagnosis not present

## 2023-08-18 NOTE — Progress Notes (Signed)
 Daily Session Note  Patient Details  Name: John Wolf MRN: [id number] Date of Birth: [birth year] Referring Provider:   Flowsheet Row Cardiac Rehab from 04/14/2023 in [facility] Cardiac and Pulmonary Rehab  Referring Provider Marilynn Shuck, MD       Encounter Date: 08/18/2023  Check In:  Session Check In - 08/18/23 0934       Check-In   Supervising physician immediately available to respond to emergencies See telemetry face sheet for immediately available ER MD    Location [facility]-Cardiac & Pulmonary Rehab    Staff Present [name] BS, Exercise Physiologist;[name], BS, Exercise Physiologist;Georgann Bramble, RN, BSN, CCRP;Joseph Hood RCP,RRT,BSRT    Virtual Visit No    Medication changes reported     No    Fall or balance concerns reported    No    Warm-up and Cool-down Performed on first and last piece of equipment    Resistance Training Performed Yes    VAD Patient? No    PAD/SET Patient? No      Pain Assessment   Currently in Pain? No/denies                Social History   Tobacco Use  Smoking Status Never  Smokeless Tobacco Never    Goals Met:  Independence with exercise equipment Exercise tolerated well No report of concerns or symptoms today  Goals Unmet:  Not Applicable  Comments: Pt able to follow exercise prescription today without complaint.  Will continue to monitor for progression.    Dr. Firman Hughes is Medical Director for [facility] Cardiac Rehabilitation.  Dr. Fuad Aleskerov is Medical Director for [facility] Pulmonary Rehabilitation.

## 2023-08-23 ENCOUNTER — Encounter: Admitting: *Deleted

## 2023-08-23 DIAGNOSIS — Z952 Presence of prosthetic heart valve: Secondary | ICD-10-CM | POA: Diagnosis not present

## 2023-08-23 NOTE — Progress Notes (Signed)
 Daily Session Note  Patient Details  Name: John Wolf MRN: [id number] Date of Birth: 1955-10-16 Referring Provider:   Flowsheet Row Cardiac Rehab from 04/14/2023 in [facility] Cardiac and Pulmonary Rehab  Referring Provider Marilynn Shuck, MD       Encounter Date: 08/23/2023  Check In:  Session Check In - 08/23/23 0913       Check-In   Supervising physician immediately available to respond to emergencies See telemetry face sheet for immediately available ER MD    Location [facility]-Cardiac & Pulmonary Rehab    Staff Present Maud Sorenson, RN, BSN, CCRP;Margaret Best, MS, Exercise Physiologist;[name] BS, Exercise Physiologist;[name], BS, Exercise Physiologist    Virtual Visit No    Medication changes reported     No    Fall or balance concerns reported    No    Warm-up and Cool-down Performed on first and last piece of equipment    Resistance Training Performed Yes    VAD Patient? No    PAD/SET Patient? No      Pain Assessment   Currently in Pain? No/denies                Social History   Tobacco Use  Smoking Status Never  Smokeless Tobacco Never    Goals Met:  Independence with exercise equipment Exercise tolerated well No report of concerns or symptoms today  Goals Unmet:  Not Applicable  Comments: Pt able to follow exercise prescription today without complaint.  Will continue to monitor for progression.    Dr. Firman Hughes is Medical Director for [facility] Cardiac Rehabilitation.  Dr. Fuad Aleskerov is Medical Director for [facility] Pulmonary Rehabilitation.

## 2023-08-25 ENCOUNTER — Encounter: Attending: Family Medicine | Admitting: *Deleted

## 2023-08-25 DIAGNOSIS — Z952 Presence of prosthetic heart valve: Secondary | ICD-10-CM | POA: Insufficient documentation

## 2023-08-25 NOTE — Progress Notes (Signed)
 Daily Session Note  Patient Details  Name: John Wolf MRN: [id number] Date of Birth: 03/14/1956 Referring Provider:   Flowsheet Row Cardiac Rehab from 04/14/2023 in [facility] Cardiac and Pulmonary Rehab  Referring Provider Marilynn Shuck, MD       Encounter Date: 08/25/2023  Check In:  Session Check In - 08/25/23 1191       Check-In   Supervising physician immediately available to respond to emergencies See telemetry face sheet for immediately available ER MD    Location [facility]-Cardiac & Pulmonary Rehab    Staff Present Maud Sorenson, RN, BSN, CCRP;Joseph Hood RCP,RRT,BSRT;Margaret Best, MS, Exercise Physiologist;[name] BS, Exercise Physiologist    Virtual Visit No    Medication changes reported     No    Fall or balance concerns reported    No    Warm-up and Cool-down Performed on first and last piece of equipment    Resistance Training Performed Yes    VAD Patient? No    PAD/SET Patient? No      Pain Assessment   Currently in Pain? No/denies                Social History   Tobacco Use  Smoking Status Never  Smokeless Tobacco Never    Goals Met:  Independence with exercise equipment Exercise tolerated well Personal goals reviewed No report of concerns or symptoms today  Goals Unmet:  Not Applicable  Comments: Pt able to follow exercise prescription today without complaint.    John Wolf graduated today from  rehab with 36 sessions completed.  Details of the patient's exercise prescription and what He needs to do in order to continue the prescription and progress were discussed with patient.  Patient was given a copy of prescription and goals.  Patient verbalized understanding. [place] plans to continue to exercise by joining a gym.   Dr. Firman Hughes is Medical Director for [facility] Cardiac Rehabilitation.  Dr. Fuad Aleskerov is Medical Director for [facility] Pulmonary Rehabilitation.

## 2023-08-25 NOTE — Progress Notes (Signed)
 Cardiac Individual Treatment Plan  Patient Details  Name: John Wolf MRN: [id number] Date of Birth: [birth year] Referring Provider:   Flowsheet Row Cardiac Rehab from 04/14/2023 in [facility] Cardiac and Pulmonary Rehab  Referring Provider Marilynn Shuck, MD       Initial Encounter Date:  Flowsheet Row Cardiac Rehab from 04/14/2023 in [facility] Cardiac and Pulmonary Rehab  Date 04/14/23       Visit Diagnosis: S/P AVR (aortic valve replacement)  Patient's Home Medications on Admission:  Current Outpatient Medications:    ALPRAZolam  (XANAX ) 0.25 MG tablet, Take 1 tablet (0.25 mg total) by mouth at bedtime as needed for anxiety. Or insomnia, Disp: 30 tablet, Rfl: 0   amiodarone  (PACERONE ) 200 MG tablet, Take 1 tablet (200 mg total) by mouth daily., Disp: 90 tablet, Rfl: 1   apixaban  (ELIQUIS ) 5 MG TABS tablet, Take 1 tablet (5 mg total) by mouth 2 (two) times daily., Disp: 60 tablet, Rfl: 5   atorvastatin  (LIPITOR) 80 MG tablet, Take 1 tablet (80 mg total) by mouth daily., Disp: 90 tablet, Rfl: 1   furosemide  (LASIX ) 40 MG tablet, TAKE ONE TABLET BY MOUTH ONCE DAILY, Disp: 90 tablet, Rfl: 1   Melatonin 1 MG CAPS, Take by mouth., Disp: , Rfl:    metoprolol  tartrate (LOPRESSOR ) 25 MG tablet, Take 1 tablet (25 mg total) by mouth 2 (two) times daily., Disp: 180 tablet, Rfl: 1   ondansetron  (ZOFRAN -ODT) 4 MG disintegrating tablet, Take 1 tablet (4 mg total) by mouth every 8 (eight) hours as needed for nausea or vomiting., Disp: 20 tablet, Rfl: 0   pantoprazole  (PROTONIX ) 40 MG tablet, Take 1 tablet (40 mg total) by mouth daily., Disp: 90 tablet, Rfl: 3   spironolactone  (ALDACTONE ) 25 MG tablet, Take 1 tablet (25 mg total) by mouth daily., Disp: 90 tablet, Rfl: 1   sulfamethoxazole -trimethoprim  (BACTRIM  DS) 800-160 MG tablet, Take 1 tablet by mouth 2 (two) times daily., Disp: 10 tablet, Rfl: 0   traZODone  (DESYREL ) 50 MG tablet, Take 0.5-1 tablets (25-50 mg total) by mouth at bedtime as needed for  sleep., Disp: 90 tablet, Rfl: 3  Past Medical History: Past Medical History:  Diagnosis Date   Acquired pes planus    Arthritis    Benign neoplasm of colon    History of left knee replacement    Hyperlipidemia    Hypertension    Obesity (BMI 30-39.9)    with 70 lb weight loss, ongoing   Onychia and paronychia of toe    Sleep apnea, obstructive 04/26/2000   wears CPAP    Tobacco Use: Social History   Tobacco Use  Smoking Status Never  Smokeless Tobacco Never    Labs: Review Flowsheet  More data exists      Latest Ref Rng & Units 06/16/2018 05/25/2019 08/01/2020 03/10/2021 06/01/2022  Labs for ITP Cardiac and Pulmonary Rehab  Cholestrol <200 mg/dL - - 409  811  914   LDL (calc) mg/dL (calc) - - 782  86  78   Direct LDL mg/dL 956.2  - - - -  HDL-C > OR = 40 mg/dL - - 13.08  65.78  70   Trlycerides <150 mg/dL - - 469.6  29.5  92   Hemoglobin A1c 4.6 - 6.5 % 5.2  5.3  - 4.7  -     Exercise Target Goals: Exercise Program Goal: Individual exercise prescription set using results from initial 6 min walk test and THRR while considering  patient's activity barriers and safety.  Exercise Prescription Goal: Initial exercise prescription builds to 30-45 minutes a day of aerobic activity, 2-3 days per week.  Home exercise guidelines will be given to patient during program as part of exercise prescription that the participant will acknowledge.   Education: Aerobic Exercise: - Group verbal and visual presentation on the components of exercise prescription. Introduces F.I.T.T principle from ACSM for exercise prescriptions.  Reviews F.I.T.T. principles of aerobic exercise including progression. Written material given at graduation.   Education: Resistance Exercise: - Group verbal and visual presentation on the components of exercise prescription. Introduces F.I.T.T principle from ACSM for exercise prescriptions  Reviews F.I.T.T. principles of resistance exercise including progression.  Written material given at graduation.    Education: Exercise & Equipment Safety: - Individual verbal instruction and demonstration of equipment use and safety with use of the equipment. Flowsheet Row Cardiac Rehab from 04/14/2023 in [facility] Cardiac and Pulmonary Rehab  Date 04/14/23  Educator MB  Instruction Review Code 1- Verbalizes Understanding       Education: Exercise Physiology & General Exercise Guidelines: - Group verbal and written instruction with models to review the exercise physiology of the cardiovascular system and associated critical values. Provides general exercise guidelines with specific guidelines to those with heart or lung disease.    Education: Flexibility, Balance, Mind/Body Relaxation: - Group verbal and visual presentation with interactive activity on the components of exercise prescription. Introduces F.I.T.T principle from ACSM for exercise prescriptions. Reviews F.I.T.T. principles of flexibility and balance exercise training including progression. Also discusses the mind body connection.  Reviews various relaxation techniques to help reduce and manage stress (i.e. Deep breathing, progressive muscle relaxation, and visualization). Balance handout provided to take home. Written material given at graduation.   Activity Barriers & Risk Stratification:  Activity Barriers & Cardiac Risk Stratification - 04/14/23 1124       Activity Barriers & Cardiac Risk Stratification   Activity Barriers Right Knee Replacement;Left Knee Replacement;Other (comment)    Comments R foot reconstruction, L shoulder replacement, lifting restriction    Cardiac Risk Stratification High             6 Minute Walk:  6 Minute Walk     Row Name 04/14/23 1122 08/04/23 0931       6 Minute Walk   Phase Initial Discharge    Distance 930 feet 1215 feet    Distance % Change -- 30.6 %    Distance Feet Change -- 285 ft    Walk Time 6 minutes 6 minutes    # of Rest Breaks 0 0    MPH  1.76 2.3    METS 1.8 2.24    RPE 11 11    Perceived Dyspnea  1 0    VO2 Peak 6.31 7.8    Symptoms No No    Resting HR 96 bpm 74 bpm    Resting BP 100/78 102/62    Resting Oxygen Saturation  96 % 92 %    Exercise Oxygen Saturation  during 6 min walk 98 % 91 %    Max Ex. HR 101 bpm 101 bpm    Max Ex. BP 136/84 136/72    2 Minute Post BP 120/86 --             Oxygen Initial Assessment:   Oxygen Re-Evaluation:   Oxygen Discharge (Final Oxygen Re-Evaluation):   Initial Exercise Prescription:  Initial Exercise Prescription - 04/14/23 1100       Date of Initial Exercise RX and Referring Provider  Date 04/14/23    Referring Provider Marilynn Shuck, MD      Oxygen   Maintain Oxygen Saturation 88% or higher      Treadmill   MPH 1.2    Grade 0    Minutes 15    METs 1.92      NuStep   Level 1    SPM 80    Minutes 15    METs 1.8      Biostep-RELP   Level 1    SPM 50    Minutes 15    METs 1.8      Track   Laps 17    Minutes 15    METs 1.92      Prescription Details   Frequency (times per week) 2    Duration Progress to 30 minutes of continuous aerobic without signs/symptoms of physical distress      Intensity   THRR 40-80% of Max Heartrate 119-143    Ratings of Perceived Exertion 11-13    Perceived Dyspnea 0-4      Progression   Progression Continue to progress workloads to maintain intensity without signs/symptoms of physical distress.      Resistance Training   Training Prescription Yes    Weight 3 lb    Reps 10-15             Perform Capillary Blood Glucose checks as needed.  Exercise Prescription Changes:   Exercise Prescription Changes     Row Name 04/14/23 1100 05/04/23 1100 05/16/23 1500 06/01/23 1400 06/07/23 1000     Response to Exercise   Blood Pressure (Admit) 100/78 120/82 118/70 116/64 --   Blood Pressure (Exercise) 136/84 138/78 138/74 142/74 --   Blood Pressure (Exit) 120/86 122/72 108/64 118/60 --   Heart Rate (Admit) 96  bpm 102 bpm 97 bpm 76 bpm --   Heart Rate (Exercise) 101 bpm 129 bpm 126 bpm 103 bpm --   Heart Rate (Exit) 100 bpm 91 bpm 105 bpm 72 bpm --   Oxygen Saturation (Admit) 96 % -- -- -- --   Oxygen Saturation (Exercise) 98 % -- -- -- --   Oxygen Saturation (Exit) 97 % -- -- -- --   Rating of Perceived Exertion (Exercise) 11 13 13 13  --   Perceived Dyspnea (Exercise) 1 -- -- -- --   Symptoms none none none none --   Comments results First three days of exercise -- -- --   Duration Progress to 30 minutes of  aerobic without signs/symptoms of physical distress Progress to 30 minutes of  aerobic without signs/symptoms of physical distress Progress to 30 minutes of  aerobic without signs/symptoms of physical distress Continue with 30 min of aerobic exercise without signs/symptoms of physical distress. --   Intensity THRR New THRR unchanged THRR unchanged THRR unchanged --     Progression   Progression Continue to progress workloads to maintain intensity without signs/symptoms of physical distress. Continue to progress workloads to maintain intensity without signs/symptoms of physical distress. Continue to progress workloads to maintain intensity without signs/symptoms of physical distress. Continue to progress workloads to maintain intensity without signs/symptoms of physical distress. --   Average METs 1.8 2.83 2.91 3.92 --     Resistance Training   Training Prescription -- Yes Yes Yes --   Weight -- 3 lb 3 lb 3 lb --   Reps -- 10-15 10-15 10-15 --     Interval Training   Interval Training -- No No No --  Treadmill   MPH -- 1.5 2.1 -- --   Grade -- 0 0 -- --   Minutes -- 15 15 -- --   METs -- 2.15 2.61 -- --     NuStep   Level -- 2 3 5  --   Minutes -- 15 15 30  --   METs -- 3.2 3.3 5 --     Biostep-RELP   Level -- 1 2 3  --   Minutes -- 15 15 30  --   METs -- -- 3 3 --     Track   Laps -- 26 -- -- --   Minutes -- 15 -- -- --   METs -- 2.41 -- -- --     Home Exercise Plan    Plans to continue exercise at -- -- -- -- [employer] (comment)  [employer]/gym for aerobic machines and dumbbells   Frequency -- -- -- -- Add 2 additional days to program exercise sessions.   Initial Home Exercises Provided -- -- -- -- 06/07/23     Oxygen   Maintain Oxygen Saturation -- 88% or higher 88% or higher 88% or higher --    Row Name 06/15/23 1000 06/28/23 1400 07/12/23 1300 07/26/23 1400 08/09/23 0700     Response to Exercise   Blood Pressure (Admit) 114/66 112/66 104/60 120/84 102/62   Blood Pressure (Exit) 118/64 118/64 128/80 112/70 102/62   Heart Rate (Admit) 75 bpm 69 bpm 75 bpm 77 bpm 95 bpm   Heart Rate (Exercise) 106 bpm 110 bpm 98 bpm 103 bpm 100 bpm   Heart Rate (Exit) 79 bpm 85 bpm 88 bpm 78 bpm 87 bpm   Rating of Perceived Exertion (Exercise) 14 13 13 15 13    Symptoms none none none none none   Duration Continue with 30 min of aerobic exercise without signs/symptoms of physical distress. Continue with 30 min of aerobic exercise without signs/symptoms of physical distress. Continue with 30 min of aerobic exercise without signs/symptoms of physical distress. Continue with 30 min of aerobic exercise without signs/symptoms of physical distress. Continue with 30 min of aerobic exercise without signs/symptoms of physical distress.   Intensity THRR unchanged THRR unchanged THRR unchanged THRR unchanged THRR unchanged     Progression   Progression Continue to progress workloads to maintain intensity without signs/symptoms of physical distress. Continue to progress workloads to maintain intensity without signs/symptoms of physical distress. Continue to progress workloads to maintain intensity without signs/symptoms of physical distress. Continue to progress workloads to maintain intensity without signs/symptoms of physical distress. Continue to progress workloads to maintain intensity without signs/symptoms of physical distress.   Average METs 4.09 3.57  4.63 4.41 3.88     Resistance Training   Training Prescription Yes Yes Yes Yes Yes   Weight 4 lb 4 lb 4 lb 5 lb 5 lb   Reps 10-15 10-15 10-15 10-15 10-15     Interval Training   Interval Training No No No No No     Treadmill   MPH 1.8 -- -- -- 1.8   Grade 0 -- -- -- 0   Minutes 15 -- -- -- 15   METs 2.38 -- -- -- 2.38     NuStep   Level 3 4 5 5  --   Minutes 15 30 30 30  --   METs 4.7 4.7 5.5 5.6 --     REL-XR   Level 4 -- -- -- --   Minutes 30 -- -- -- --  METs 5.5 -- -- -- --     Biostep-RELP   Level 4 5 5 6 5    Minutes 30 30 30 30 15    METs 4 3 5 4 4      Home Exercise Plan   Plans to continue exercise at [employer] (comment)  [employer]/gym for aerobic machines and Public librarian (comment)  [employer]/gym for aerobic machines and Public librarian (comment)  [employer]/gym for [employer] and Public librarian (comment)  [employer]/gym for [employer] and Public librarian (comment)  [employer]/gym for aerobic machines and dumbbells   Frequency Add 2 additional days to program exercise sessions. Add 2 additional days to program exercise sessions. Add 2 additional days to program exercise sessions. Add 2 additional days to program exercise sessions. Add 2 additional days to program exercise sessions.   Initial Home Exercises Provided 06/07/23 06/07/23 06/07/23 06/07/23 06/07/23     Oxygen   Maintain Oxygen Saturation 88% or higher 88% or higher 88% or higher 88% or higher 88% or higher    Row Name 08/24/23 0700             Response to Exercise   Blood Pressure (Admit) 110/70       Blood Pressure (Exit) 118/70       Heart Rate (Admit) 71 bpm       Heart Rate (Exercise) 97 bpm       Heart Rate (Exit) 90 bpm       Oxygen Saturation (Admit) 91 %       Oxygen Saturation (Exercise) 90 %       Oxygen Saturation (Exit) 98 %        Rating of Perceived Exertion (Exercise) 13       Symptoms none       Duration Continue with 30 min of aerobic exercise without signs/symptoms of physical distress.       Intensity THRR unchanged         Progression   Progression Continue to progress workloads to maintain intensity without signs/symptoms of physical distress.       Average METs 3.49         Resistance Training   Training Prescription Yes       Weight 5 lb       Reps 10-15         Interval Training   Interval Training No         Treadmill   MPH 2.2       Grade 0       Minutes 15       METs 2.69         NuStep   Level 5       Minutes 15       METs 6         Biostep-RELP   Level 5       Minutes 15       METs 4         Home Exercise Plan   Plans to continue exercise at [employer] (comment)  [employer]/gym for aerobic machines and dumbbells       Frequency Add 2 additional days to program exercise sessions.       Initial Home Exercises Provided 06/07/23         Oxygen   Maintain Oxygen Saturation 88% or higher  Exercise Comments:   Exercise Comments     Row Name 04/21/23 0932 08/25/23 7564         Exercise Comments First full day of exercise!  Patient was oriented to gym and equipment including functions, settings, policies, and procedures.  Patient's individual exercise prescription and treatment plan were reviewed.  All starting workloads were established based on the results of the 6 minute walk test done at initial orientation visit.  The plan for exercise progression was also introduced and progression will be customized based on patient's performance and goals. Murad graduated today from  rehab with 36 sessions completed.  Details of the patient's exercise prescription and what He needs to do in order to continue the prescription and progress were discussed with patient.  Patient was given a copy of prescription and goals.  Patient verbalized  understanding. Chaise plans to continue to exercise by joining a gym.               Exercise Goals and Review:   Exercise Goals     Row Name 04/14/23 1129             Exercise Goals   Increase Physical Activity Yes       Intervention Provide advice, education, support and counseling about physical activity/exercise needs.;Develop an individualized exercise prescription for aerobic and resistive training based on initial evaluation findings, risk stratification, comorbidities and participant's personal goals.       Expected Outcomes Short Term: Attend rehab on a regular basis to increase amount of physical activity.;Long Term: Add in home exercise to make exercise part of routine and to increase amount of physical activity.;Long Term: Exercising regularly at least 3-5 days a week.       Increase Strength and Stamina Yes       Intervention Provide advice, education, support and counseling about physical activity/exercise needs.;Develop an individualized exercise prescription for aerobic and resistive training based on initial evaluation findings, risk stratification, comorbidities and participant's personal goals.       Expected Outcomes Short Term: Increase workloads from initial exercise prescription for resistance, speed, and METs.;Short Term: Perform resistance training exercises routinely during rehab and add in resistance training at home;Long Term: Improve cardiorespiratory fitness, muscular endurance and strength as measured by increased METs and functional capacity ( )       Able to understand and use rate of perceived exertion (RPE) scale Yes       Intervention Provide education and explanation on how to use RPE scale       Expected Outcomes Short Term: Able to use RPE daily in rehab to express subjective intensity level;Long Term:  Able to use RPE to guide intensity level when exercising independently       Able to understand and use Dyspnea scale Yes       Intervention  Provide education and explanation on how to use Dyspnea scale       Expected Outcomes Short Term: Able to use Dyspnea scale daily in rehab to express subjective sense of shortness of breath during exertion;Long Term: Able to use Dyspnea scale to guide intensity level when exercising independently       Knowledge and understanding of Target Heart Rate Range (THRR) Yes       Intervention Provide education and explanation of THRR including how the numbers were predicted and where they are located for reference       Expected Outcomes Short Term: Able to state/look up THRR;Short Term: Able to use daily  as guideline for intensity in rehab;Long Term: Able to use THRR to govern intensity when exercising independently       Able to check pulse independently Yes       Intervention Provide education and demonstration on how to check pulse in carotid and radial arteries.;Review the importance of being able to check your own pulse for safety during independent exercise       Expected Outcomes Short Term: Able to explain why pulse checking is important during independent exercise;Long Term: Able to check pulse independently and accurately       Understanding of Exercise Prescription Yes       Intervention Provide education, explanation, and written materials on patient's individual exercise prescription       Expected Outcomes Short Term: Able to explain program exercise prescription;Long Term: Able to explain home exercise prescription to exercise independently                Exercise Goals Re-Evaluation :  Exercise Goals Re-Evaluation     Row Name 04/21/23 0932 05/04/23 1134 05/16/23 1549 06/01/23 1447 06/07/23 1007     Exercise Goal Re-Evaluation   Exercise Goals Review Able to understand and use rate of perceived exertion (RPE) scale;Able to understand and use Dyspnea scale;Knowledge and understanding of Target Heart Rate Range (THRR);Understanding of Exercise Prescription Increase Physical  Activity;Increase Strength and Stamina;Understanding of Exercise Prescription Increase Physical Activity;Increase Strength and Stamina;Understanding of Exercise Prescription Increase Physical Activity;Increase Strength and Stamina;Understanding of Exercise Prescription Increase Physical Activity;Able to understand and use Dyspnea scale;Understanding of Exercise Prescription;Increase Strength and Stamina;Knowledge and understanding of Target Heart Rate Range (THRR);Able to check pulse independently;Able to understand and use rate of perceived exertion (RPE) scale   Comments Reviewed RPE and dyspnea scale, THR and program prescription with pt today.  Pt voiced understanding and was given a copy of goals to take home. Lucciano is off to a good start in the program. He was able to increase his treadmill workload up to a speed of 1.5 mph with no incline, and walked up to 26 laps on the track. He also did well on the biostep at level 1 and the T4 nustep at level 2. We will continue to monitor his progress in the program. [place] is doing well in the program. He recently increased his treadmill speed to 2.1 mph with no incline. He also improved to level 3 on the T4 nustep and level 2 on the biostep. We will continue to monitor his progress in the program. Rayfus continues to do well in rehab. He has increased to level 5 on the T4 nustep and level 3 on the biostep. He did the same modality for his aerobic exercise, either the T4 nustep or biostep. We will discuss and encourge adding in different modalities to add more variety to his exercise. We will continue to monitor (his/her) progress in the program. Reviewed home exercise with pt today.  Pt plans to use the [employer] [facility]/gym for aerobic machines and dumbbells for resistance 1-2 days at home for exercise.  Reviewed THR, pulse, RPE, sign and symptoms, pulse oximetery and when to call 911 or MD.  Also discussed weather considerations and indoor options.  Pt  voiced understanding.   Expected Outcomes Short: Use RPE daily to regulate intensity. Long: Follow program prescription in THR. Short: Continue to follow current exercise prescription. Long: Continue exercise to improve strength and stamina. Short: Continue to progressively increase treadmill workload. Long: Continue exercise to improve strength and  stamina. Short: Add back in different modalities to exercise prescription and continue to progressively increase. Long: Continue exercise to improve strength and stamina. Short: Add 2 additional days of home exercise. Long: Continue to exercise at home independently.    Row Name 06/15/23 1012 06/28/23 1423 07/12/23 1354 07/19/23 0943 07/26/23 1422     Exercise Goal Re-Evaluation   Exercise Goals Review Increase Physical Activity;Increase Strength and Stamina;Understanding of Exercise Prescription Increase Physical Activity;Increase Strength and Stamina;Understanding of Exercise Prescription Increase Physical Activity;Increase Strength and Stamina;Understanding of Exercise Prescription Increase Physical Activity;Increase Strength and Stamina;Understanding of Exercise Prescription Increase Physical Activity;Increase Strength and Stamina;Understanding of Exercise Prescription   Comments Ethyn is doing well in rehab. He recently began using the treadmill again at a speed of 1.8 mph with no incline. He also improved to level 4 on the biostep and XR, and was able to do 30 minutes of continuous exercise on both machines. We will continue to monitor his progress in the program. Han continues to do well in rehab. He recently improved to level 4 on the T4 nustep and level 5 on the biostep. He has not done any walking in the program since the last review. We will continue to monitor his progress in the program. Jurem continues to do well in rehab. He recently improved to level 5 on the T4 nustep and continues to work at level 5 on the biostep. He has not done any walking  in the program since the last review. We will continue to monitor his progress in the program. Coby is doing well at rehab. He is on Biostep level 5. Spoke with him about adding more intentional exercise outside of rehab to prepare for when he graduates from the program. Jeniel is doing well in rehab. He improved to level 6 on the biostep and continued to work at level 5 on the T4 nustep. He also increased from 4 lb to 5 lb hand weights for resistance training as well. We will continue to monitor his progress in the program.   Expected Outcomes Short: Continue to progressively increase treadmill workload. Long: Continue exercise to improve strength and stamina. Short: Return to walking the treadmill in rehab. Long: Continue exercise to improve strength and stamina. Short: Return to walking the treadmill in rehab. Long: Continue exercise to improve strength and stamina. STG: increase workload and add exercise at home. LTG: Continue to improve strength and stamina Short: Continue to progressively increase workloads. Long: Continue exercise to improve strength and stamina.    Row Name 08/09/23 0751 08/16/23 0953 08/24/23 0801         Exercise Goal Re-Evaluation   Exercise Goals Review Increase Physical Activity;Increase Strength and Stamina;Understanding of Exercise Prescription -- Increase Physical Activity;Increase Strength and Stamina;Understanding of Exercise Prescription     Comments Augusto continues to do well in rehab and is close to graduating from the program. He recently completed his post and improved by 30.6%! He also contiues to walk the treadmill at 1.8 mph with no incline. We will continue to monitor his progress until he graduates from the program. Rubie is doing well in rehab, complested his . He reports he is doing some walking at home but not much else besides yard work and working with Physicist, medical. Encouraged him to think about adding intentional exercise to replace the exercise he is  getting her at rehab since he will be graduating soon. Deterrius continues to do well in rehab and will graduate soon. He was able to  increase his workload on the treadmill to a speed of 2. and no incline. He maintained level 5 on the biostep and T4 nustep. We will continue to monitor his progress in the program until graduation.     Expected Outcomes Short: Graduate. Long: Continue to exercise independently. STG: Think about long term exercise plan. LTG: Continue to exercise independently Short: Graduate. Long: Continue to exercise independently.              Discharge Exercise Prescription (Final Exercise Prescription Changes):  Exercise Prescription Changes - 08/24/23 0700       Response to Exercise   Blood Pressure (Admit) 110/70    Blood Pressure (Exit) 118/70    Heart Rate (Admit) 71 bpm    Heart Rate (Exercise) 97 bpm    Heart Rate (Exit) 90 bpm    Oxygen Saturation (Admit) 91 %    Oxygen Saturation (Exercise) 90 %    Oxygen Saturation (Exit) 98 %    Rating of Perceived Exertion (Exercise) 13    Symptoms none    Duration Continue with 30 min of aerobic exercise without signs/symptoms of physical distress.    Intensity THRR unchanged      Progression   Progression Continue to progress workloads to maintain intensity without signs/symptoms of physical distress.    Average METs 3.49      Resistance Training   Training Prescription Yes    Weight 5 lb    Reps 10-15      Interval Training   Interval Training No      Treadmill   MPH 2.2    Grade 0    Minutes 15    METs 2.69      NuStep   Level 5    Minutes 15    METs 6      Biostep-RELP   Level 5    Minutes 15    METs 4      Home Exercise Plan   Plans to continue exercise at [employer] (comment)   [employer]/gym for aerobic machines and dumbbells   Frequency Add 2 additional days to program exercise sessions.    Initial Home Exercises Provided 06/07/23      Oxygen   Maintain Oxygen  Saturation 88% or higher             Nutrition:  Target Goals: Understanding of nutrition guidelines, daily intake of sodium 1500mg , cholesterol 200mg , calories 30% from fat and 7% or less from saturated fats, daily to have 5 or more servings of fruits and vegetables.  Education: All About Nutrition: -Group instruction provided by verbal, written material, interactive activities, discussions, models, and posters to present general guidelines for heart healthy nutrition including fat, fiber, MyPlate, the role of sodium in heart healthy nutrition, utilization of the nutrition label, and utilization of this knowledge for meal planning. Follow up email sent as well. Written material given at graduation.   Biometrics:  Pre Biometrics - 04/14/23 1130       Pre Biometrics   Height 5\' 7"  (1.702 m)    Weight 260 lb 4.8 oz (118.1 kg)    Waist Circumference 48 inches    Hip Circumference 54 inches    Waist to Hip Ratio 0.89 %    BMI (Calculated) 40.76    Single Leg Stand 2 seconds             Post Biometrics - 08/04/23 0935        Post  Biometrics  Height 5\' 7"  (1.702 m)    Weight 267 lb (121.1 kg)    Waist Circumference 47 inches    Hip Circumference 50.5 inches    Waist to Hip Ratio 0.93 %    BMI (Calculated) 41.81    Single Leg Stand 3 seconds             Nutrition Therapy Plan and Nutrition Goals:  Nutrition Therapy & Goals - 05/31/23 1116       Nutrition Therapy   Diet Cardiac, Low Na    Protein (specify units) 75-90    Fiber 30 grams    Whole Grain Foods 3 servings    Saturated Fats 15 max. grams    Fruits and Vegetables 5 servings/day    Sodium 2 grams      Personal Nutrition Goals   Nutrition Goal Drink more water , ~32-64oz per day, less alcohol and milk    Personal Goal #2 Read labels and reduce sodium intake to below 2300mg . Ideally 1500mg  per day.    Personal Goal #3 Eat 15-30gProtein and 30-60gCarbs at each meal hour    Comments Patient not  drinking much water , but drinks decaf coffee, milk, alcoholic drinks. Discussed cutting back on some of these beverages to fit more water  in. He agrees it is a good goal and will try to increase his water  intake. He reports he doesn't eat fried foods or salt his foods either. Commended him on this, reviewed a few facts labels and educated that most sodium in americans diets come from food already high in sodium rather than salting at table. Set goal to reviewed facts labels for foods he eats often and make sure he is eating less than 1500mg  sodium per day. He rarely eats lunch. Discussed the importance of meeting nutritional goals and how eating only 2 meals per day can make meeting these goals difficult. Set goal to use nutrient and protein rich foods as snacks during missed meals. Reviewed mediterranean diet handout, educated on types of fats, sources, and how to read labels.      Intervention Plan   Intervention Prescribe, educate and counsel regarding individualized specific dietary modifications aiming towards targeted core components such as weight, hypertension, lipid management, diabetes, heart failure and other comorbidities.;Nutrition handout(s) given to patient.    Expected Outcomes Short Term Goal: Understand basic principles of dietary content, such as calories, fat, sodium, cholesterol and nutrients.;Short Term Goal: A plan has been developed with personal nutrition goals set during dietitian appointment.;Long Term Goal: Adherence to prescribed nutrition plan.             Nutrition Assessments:  MEDIFICTS Score Key: >=70 Need to make dietary changes  40-70 Heart Healthy Diet <= 40 Therapeutic Level Cholesterol Diet   Picture Your Plate Scores: <41 Unhealthy dietary pattern with much room for improvement. 41-50 Dietary pattern unlikely to meet recommendations for good health and room for improvement. 51-60 More healthful dietary pattern, with some room for improvement.  >60  Healthy dietary pattern, although there may be some specific behaviors that could be improved.    Nutrition Goals Re-Evaluation:  Nutrition Goals Re-Evaluation     Row Name 05/26/23 [phone] 07/19/23 3016 08/16/23 0959         Goals   Nutrition Goal Schedule appointment with RD -- --     Comment Hallis would like to schedule an appointment with RD. Hartley is still working on drinking more water . 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 Encouraged him to read labels and keep sodium intake low, below 1500mg  daily would be ideal. Therman is working on adding more water  to his diet. He says he has been eating more than he should. Says he has been having trouble getting full. Educated on calorie density and encourged more low calorie dense foods to get full with more quanity without getting so many calories.     Expected Outcome Short: Meet with RD. Long: adhere to a diet that pertains to him. STG: drink 32-64oz of water  and reduce sodium to below 1500mg  daily. LTG: Follow a heart healthy diet STG: Drink 32-64oz of water , include more low calorie dense foods. LTG: Follow heart healthy diet              Nutrition Goals Discharge (Final Nutrition Goals Re-Evaluation):  Nutrition Goals Re-Evaluation - 08/16/23 0959       Goals   Comment Remer is working on adding more water  to his diet. He says he has been eating more than he should. Says he has been having trouble getting full. Educated on calorie density and encourged more low calorie dense foods to get full with more quanity without getting so many calories.    Expected Outcome STG: Drink 32-64oz of water , include more low calorie dense foods. LTG: Follow heart healthy diet             Psychosocial: Target Goals: Acknowledge presence or absence of significant depression and/or stress, maximize coping skills, provide positive support system. Participant is able to verbalize types  and ability to use techniques and skills needed for reducing stress and depression.   Education: Stress, Anxiety, and Depression - Group verbal and visual presentation to define topics covered.  Reviews how body is impacted by stress, anxiety, and depression.  Also discusses healthy ways to reduce stress and to treat/manage anxiety and depression.  Written material given at graduation.   Education: Sleep Hygiene -Provides group verbal and written instruction about how sleep can affect your health.  Define sleep hygiene, discuss sleep cycles and impact of sleep habits. Review good sleep hygiene tips.    Initial Review & Psychosocial Screening:  Initial Psych Review & Screening - 04/11/23 1123       Initial Review   Current issues with Current Sleep Concerns;Current Stress Concerns    Source of Stress Concerns Chronic Illness      Family Dynamics   Good Support System? Yes    Comments Eschol sleeps with a CPAP but he has not been able to use it since he gets up alot and cant sleep enough to use it. He can look to his wife for support.      Barriers   Psychosocial barriers to participate in program The patient should benefit from training in stress management and relaxation.      Screening Interventions   Interventions Encouraged to exercise;To provide support and resources with identified psychosocial needs;Provide feedback about the scores to participant    Expected Outcomes Short Term goal: Utilizing psychosocial counselor, staff and physician to assist with identification of specific Stressors or current issues interfering with healing process. Setting desired goal for each stressor or current issue identified.;Long Term Goal: Stressors or current issues are controlled or eliminated.;Short Term goal: Identification and review with participant of any Quality of Life or Depression concerns found by scoring the questionnaire.;Long Term goal: The participant improves quality of Life and PHQ9  Scores as seen by  post scores and/or verbalization of changes             Quality of Life Scores:   Scores of 19 and below usually indicate a poorer quality of life in these areas.  A difference of  2-3 points is a clinically meaningful difference.  A difference of 2-3 points in the total score of the Quality of Life Index has been associated with significant improvement in overall quality of life, self-image, physical symptoms, and general health in studies assessing change in quality of life.  PHQ-9: Review Flowsheet  More data exists      07/19/2023 05/26/2023 04/14/2023 03/15/2023 06/01/2022  Depression screen PHQ 2/9  Decreased Interest 0 0 1 0 3  Down, Depressed, Hopeless 0 0 0 0 3  PHQ - 2 Score 0 0 1 0 6  Altered sleeping 2 2 2  - 3  Tired, decreased energy 2 3 1  - 3  Change in appetite 0 0 0 - 2  Feeling bad or failure about yourself  0 0 0 - 2  Trouble concentrating 0 0 1 - 3  Moving slowly or fidgety/restless 0 0 0 - 3  Suicidal thoughts 0 0 0 - 0  PHQ-9 Score 4 5 5  - 22  Difficult doing work/chores Somewhat difficult Not difficult at all Somewhat difficult - Extremely dIfficult   Interpretation of Total Score  Total Score Depression Severity:  1-4 = Minimal depression, 5-9 = Mild depression, 10-14 = Moderate depression, 15-19 = Moderately severe depression, 20-27 = Severe depression   Psychosocial Evaluation and Intervention:  Psychosocial Evaluation - 04/11/23 1125       Psychosocial Evaluation & Interventions   Interventions Relaxation education;Stress management education;Encouraged to exercise with the program and follow exercise prescription    Comments Narada sleeps with a CPAP but he has not been able to use it since he gets up alot and cant sleep enough to use it. He can look to his wife for support.    Expected Outcomes Short: Start [facility] to help with mood. Long: Maintain a healthy mental state    Continue Psychosocial Services  Follow up required by  staff             Psychosocial Re-Evaluation:  Psychosocial Re-Evaluation     Row Name 05/26/23 [phone] 07/19/23 0934 08/16/23 0956         Psychosocial Re-Evaluation   Current issues with Current Stress Concerns;Current Sleep Concerns Current Sleep Concerns Current Sleep Concerns     Comments Reviewed patient health questionnaire (PHQ-9) with patient for follow up. Previously, patients score indicated signs/symptoms of depression.  Reviewed to see if patient is improving symptom wise while in program. Score stayed the same and patient states that it is because he does not sleep well and has a lack of energy. Completed PHQ-9, score came down from 5 to 4. He doesnt sleep well and feels he could be doing more but his stamina is not good enough to do all he wants. He reports this can make him feel down at times, but denies any anxiety or depression. Sidh denies any anxiety, depression, or stress. Reports he still hasnt been sleeping well. Reminded him of talking points around sleep hygiene and communication with dr about medications.     Expected Outcomes Short: Continue to work toward an improvement in PHQ9 scores by attending [facility] regularly. Long: Continue to improve stress and depression coping skills by talking with staff and attending [facility] regularly and work toward a positive  mental state. STG: Continue to attend cardiac rehab. LTG: maintain a positive outlook on health and daily life STG: Practice good sleep hygiene. LTG: maintain a positive outlook on health and daily life     Interventions Encouraged to attend Cardiac Rehabilitation for the exercise Encouraged to attend Cardiac Rehabilitation for the exercise Encouraged to attend Cardiac Rehabilitation for the exercise     Continue Psychosocial Services  Follow up required by staff Follow up required by staff Follow up required by staff       Initial Review   Source of Stress Concerns -- Chronic Illness --               Psychosocial Discharge (Final Psychosocial Re-Evaluation):  Psychosocial Re-Evaluation - 08/16/23 0956       Psychosocial Re-Evaluation   Current issues with Current Sleep Concerns    Comments Yaacov denies any anxiety, depression, or stress. Reports he still hasnt been sleeping well. Reminded him of talking points around sleep hygiene and communication with dr about medications.    Expected Outcomes STG: Practice good sleep hygiene. LTG: maintain a positive outlook on health and daily life    Interventions Encouraged to attend Cardiac Rehabilitation for the exercise    Continue Psychosocial Services  Follow up required by staff             Vocational Rehabilitation: Provide vocational rehab assistance to qualifying candidates.   Vocational Rehab Evaluation & Intervention:   Education: Education Goals: Education classes will be provided on a variety of topics geared toward better understanding of heart health and risk factor modification. Participant will state understanding/return demonstration of topics presented as noted by education test scores.  Learning Barriers/Preferences:  Learning Barriers/Preferences - 04/11/23 1123       Learning Barriers/Preferences   Learning Barriers None    Learning Preferences None             General Cardiac Education Topics:  AED/CPR: - Group verbal and written instruction with the use of models to demonstrate the basic use of the AED with the basic ABC's of resuscitation.   Anatomy and Cardiac Procedures: - Group verbal and visual presentation and models provide information about basic cardiac anatomy and function. Reviews the testing methods done to diagnose heart disease and the outcomes of the test results. Describes the treatment choices: Medical Management, Angioplasty, or Coronary Bypass Surgery for treating various heart conditions including Myocardial Infarction, Angina, Valve Disease, and Cardiac Arrhythmias.  Written  material given at graduation.   Medication Safety: - Group verbal and visual instruction to review commonly prescribed medications for heart and lung disease. Reviews the medication, class of the drug, and side effects. Includes the steps to properly store meds and maintain the prescription regimen.  Written material given at graduation.   Intimacy: - Group verbal instruction through game format to discuss how heart and lung disease can affect sexual intimacy. Written material given at graduation..   Know Your Numbers and Heart Failure: - Group verbal and visual instruction to discuss disease risk factors for cardiac and pulmonary disease and treatment options.  Reviews associated critical values for Overweight/Obesity, Hypertension, Cholesterol, and Diabetes.  Discusses basics of heart failure: signs/symptoms and treatments.  Introduces Heart Failure Zone chart for action plan for heart failure.  Written material given at graduation.   Infection Prevention: - Provides verbal and written material to individual with discussion of infection control including proper hand washing and proper equipment cleaning during exercise session. Flowsheet Row Cardiac Rehab  from 04/14/2023 in [facility] Cardiac and Pulmonary Rehab  Date 04/14/23  Educator MB  Instruction Review Code 1- Verbalizes Understanding       Falls Prevention: - Provides verbal and written material to individual with discussion of falls prevention and safety. Flowsheet Row Cardiac Rehab from 04/14/2023 in [facility] Cardiac and Pulmonary Rehab  Date 04/14/23  Educator MB  Instruction Review Code 1- Verbalizes Understanding       Other: -Provides group and verbal instruction on various topics (see comments)   Knowledge Questionnaire Score:   Core Components/Risk Factors/Patient Goals at Admission:  Personal Goals and Risk Factors at Admission - 04/14/23 1132       Core Components/Risk Factors/Patient Goals on Admission     Weight Management Yes;Weight Loss    Intervention Weight Management: Develop a combined nutrition and exercise program designed to reach desired caloric intake, while maintaining appropriate intake of nutrient and fiber, sodium and fats, and appropriate energy expenditure required for the weight goal.;Weight Management: Provide education and appropriate resources to help participant work on and attain dietary goals.;Weight Management/Obesity: Establish reasonable short term and long term weight goals.    Admit Weight 260 lb 4.8 oz (118.1 kg)    Goal Weight: Short Term 255 lb (115.7 kg)    Goal Weight: Long Term 250 lb (113.4 kg)    Expected Outcomes Long Term: Adherence to nutrition and physical activity/exercise program aimed toward attainment of established weight goal;Short Term: Continue to assess and modify interventions until short term weight is achieved;Weight Loss: Understanding of general recommendations for a balanced deficit meal plan, which promotes 1-2 lb weight loss per week and includes a negative energy balance of [phone] kcal/d;Understanding recommendations for meals to include 15-35% energy as protein, 25-35% energy from fat, 35-60% energy from carbohydrates, less than 200mg  of dietary cholesterol, 20-35 gm of total fiber daily;Understanding of distribution of calorie intake throughout the day with the consumption of 4-5 meals/snacks    Heart Failure Yes    Intervention Provide a combined exercise and nutrition program that is supplemented with education, support and counseling about heart failure. Directed toward relieving symptoms such as shortness of breath, decreased exercise tolerance, and extremity edema.    Expected Outcomes Improve functional capacity of life;Short term: Attendance in program 2-3 days a week with increased exercise capacity. Reported lower sodium intake. Reported increased fruit and vegetable intake. Reports medication compliance.;Short term: Daily weights  obtained and reported for increase. Utilizing diuretic protocols set by physician.    Hypertension Yes    Intervention Provide education on lifestyle modifcations including regular physical activity/exercise, weight management, moderate sodium restriction and increased consumption of fresh fruit, vegetables, and low fat dairy, alcohol moderation, and smoking cessation.;Monitor prescription use compliance.    Expected Outcomes Short Term: Continued assessment and intervention until BP is < 140/30mm HG in hypertensive participants. < 130/20mm HG in hypertensive participants with diabetes, heart failure or chronic kidney disease.;Long Term: Maintenance of blood pressure at goal levels.             Education:Diabetes - Individual verbal and written instruction to review signs/symptoms of diabetes, desired ranges of glucose level fasting, after meals and with exercise. Acknowledge that pre and post exercise glucose checks will be done for 3 sessions at entry of program.   Core Components/Risk Factors/Patient Goals Review:   Goals and Risk Factor Review     Row Name 05/26/23 0928 07/19/23 0941 08/16/23 1001         Core Components/Risk Factors/Patient Goals  Review   Personal Goals Review Weight Management/Obesity Weight Management/Obesity Weight Management/Obesity     Review Jaaden wants to lose weight and reach a 240 pounds weight goal. He has been working hard in the program and is wanting to improve his overall health. He has a realistic goal of wanting to lose 20 pounds in 4-6 months. Esper is still working hard here at rehab, he is wanting to lost ~20lbs. Encouraged him to continue to attend rehab but also look to add more exercise at home. He says he has access to a gym and he likes working out on the exercise bikes. Jostyn is wanting to lose weight. Reminded him that exercise is important part of the weight loss journey and encouraged him to look to add exercise to replace what will be lost  when he graduates from the program. Had good discussion about whole foods and choosing more low calorie dense foods.     Expected Outcomes Short: lose a few pounds in the next couple weeks. Long: reach weight goal. STG: lose 5% CBW. LTG: achieve and maintain a healthy weight STG: Lose 5% CBW. LTG: Achieve and maintain a healthy weight              Core Components/Risk Factors/Patient Goals at Discharge (Final Review):   Goals and Risk Factor Review - 08/16/23 1001       Core Components/Risk Factors/Patient Goals Review   Personal Goals Review Weight Management/Obesity    Review Barndon is wanting to lose weight. Reminded him that exercise is important part of the weight loss journey and encouraged him to look to add exercise to replace what will be lost when he graduates from the program. Had good discussion about whole foods and choosing more low calorie dense foods.    Expected Outcomes STG: Lose 5% CBW. LTG: Achieve and maintain a healthy weight             ITP Comments:  ITP Comments     Row Name 04/11/23 1128 04/14/23 1122 04/21/23 0932 05/11/23 1452 06/08/23 0752   ITP Comments Virtual Visit completed. Patient informed on EP and RD appointment and 6 Minute walk test. Patient also informed of patient health questionnaires on My Chart. Patient Verbalizes understanding. Visit diagnosis [facility] in [facility]. Completed and gym orientation. Initial ITP created and sent for review to Dr. Firman Hughes, Medical Director. First full day of exercise!  Patient was oriented to gym and equipment including functions, settings, policies, and procedures.  Patient's individual exercise prescription and treatment plan were reviewed.  All starting workloads were established based on the results of the 6 minute walk test done at initial orientation visit.  The plan for exercise progression was also introduced and progression will be customized based on patient's performance and goals. 30 Day  review completed. Medical Director ITP review done, changes made as directed, and signed approval by Medical Director. 30 Day review completed. Medical Director ITP review done, changes made as directed, and signed approval by Medical Director.    Row Name 07/06/23 0732 08/03/23 0934 08/25/23 0939       ITP Comments 30 Day review completed. Medical Director ITP review done, changes made as directed, and signed approval by Medical Director. 30 Day review completed. Medical Director ITP review done, changes made as directed, and signed approval by Medical Director. Malakie graduated today from  rehab with 36 sessions completed.  Details of the patient's exercise prescription and what He needs to do  in order to continue the prescription and progress were discussed with patient.  Patient was given a copy of prescription and goals.  Patient verbalized understanding. Hashir plans to continue to exercise by joining a gym.              Comments: Discharge ITP

## 2023-08-25 NOTE — Progress Notes (Signed)
 Discharge Note for  John Wolf     [birth year]       [name] graduated today from  rehab with 36 sessions completed.  Details of the patient's exercise prescription and what He needs to do in order to continue the prescription and progress were discussed with patient.  Patient was given a copy of prescription and goals.  Patient verbalized understanding. [name] plans to continue to exercise by joining a gym.     6 Minute Walk     Row Name 04/14/23 1122 08/04/23 0931       6 Minute Walk   Phase Initial Discharge    Distance 930 feet 1215 feet    Distance % Change -- 30.6 %    Distance Feet Change -- 285 ft    Walk Time 6 minutes 6 minutes    # of Rest Breaks 0 0    MPH 1.76 2.3    METS 1.8 2.24    RPE 11 11    Perceived Dyspnea  1 0    VO2 Peak 6.31 7.8    Symptoms No No    Resting HR 96 bpm 74 bpm    Resting BP 100/78 102/62    Resting Oxygen Saturation  96 % 92 %    Exercise Oxygen Saturation  during 6 min walk 98 % 91 %    Max Ex. HR 101 bpm 101 bpm    Max Ex. BP 136/84 136/72    2 Minute Post BP 120/86 --

## 2023-08-30 ENCOUNTER — Encounter

## 2023-08-31 ENCOUNTER — Other Ambulatory Visit: Payer: Self-pay | Admitting: Internal Medicine

## 2023-09-01 ENCOUNTER — Encounter

## 2023-09-05 ENCOUNTER — Encounter: Payer: Self-pay | Admitting: Oncology

## 2023-09-06 ENCOUNTER — Encounter

## 2023-09-08 ENCOUNTER — Encounter

## 2023-09-13 ENCOUNTER — Encounter

## 2023-09-14 ENCOUNTER — Telehealth: Payer: Self-pay | Admitting: Internal Medicine

## 2023-09-14 NOTE — Telephone Encounter (Signed)
 Pt  was called on 09/14/2023 for to get scheduled for an  firt initial AWV pt denied.

## 2023-09-15 ENCOUNTER — Encounter

## 2023-10-03 ENCOUNTER — Other Ambulatory Visit: Payer: Self-pay | Admitting: Internal Medicine

## 2023-10-03 DIAGNOSIS — N63 Unspecified lump in unspecified breast: Secondary | ICD-10-CM

## 2023-10-03 DIAGNOSIS — T50905A Adverse effect of unspecified drugs, medicaments and biological substances, initial encounter: Secondary | ICD-10-CM

## 2023-10-11 ENCOUNTER — Encounter: Payer: Self-pay | Admitting: Internal Medicine

## 2023-10-11 DIAGNOSIS — N63 Unspecified lump in unspecified breast: Secondary | ICD-10-CM | POA: Insufficient documentation

## 2023-10-19 ENCOUNTER — Other Ambulatory Visit: Payer: Self-pay | Admitting: Internal Medicine

## 2023-10-19 DIAGNOSIS — N63 Unspecified lump in unspecified breast: Secondary | ICD-10-CM

## 2023-10-19 DIAGNOSIS — N6342 Unspecified lump in left breast, subareolar: Secondary | ICD-10-CM

## 2023-10-20 ENCOUNTER — Ambulatory Visit
Admission: RE | Admit: 2023-10-20 | Discharge: 2023-10-20 | Disposition: A | Discharge status: Home / Self Care | Source: Ambulatory Visit | Attending: Internal Medicine | Admitting: Internal Medicine

## 2023-10-20 DIAGNOSIS — N63 Unspecified lump in unspecified breast: Secondary | ICD-10-CM | POA: Diagnosis present

## 2023-10-20 DIAGNOSIS — N62 Hypertrophy of breast: Secondary | ICD-10-CM | POA: Insufficient documentation

## 2023-10-20 DIAGNOSIS — N6342 Unspecified lump in left breast, subareolar: Secondary | ICD-10-CM | POA: Diagnosis not present

## 2023-10-21 ENCOUNTER — Encounter

## 2023-10-21 ENCOUNTER — Ambulatory Visit: Payer: Self-pay | Admitting: Internal Medicine

## 2023-10-21 ENCOUNTER — Other Ambulatory Visit

## 2023-10-21 DIAGNOSIS — T50905A Adverse effect of unspecified drugs, medicaments and biological substances, initial encounter: Secondary | ICD-10-CM | POA: Insufficient documentation

## 2023-10-21 NOTE — Assessment & Plan Note (Signed)
 Secondary to spironolactone  which was started in 2024 for low EF . LV function las  recovered

## 2023-12-09 ENCOUNTER — Other Ambulatory Visit: Payer: Self-pay | Admitting: Internal Medicine

## 2024-02-13 ENCOUNTER — Ambulatory Visit

## 2024-02-18 ENCOUNTER — Other Ambulatory Visit: Payer: Self-pay | Admitting: Internal Medicine

## 2024-05-02 ENCOUNTER — Ambulatory Visit

## 2024-05-02 VITALS — Ht 67.0 in | Wt 267.0 lb

## 2024-05-02 NOTE — Progress Notes (Signed)
"  Patient doing well.  "

## 2024-05-08 NOTE — Addendum Note (Signed)
 Addended by: EMMITT JULIAN DEL on: 05/08/2024 02:07 PM   Modules accepted: Orders

## 2024-05-08 NOTE — Progress Notes (Signed)
 "  Chief Complaint  Patient presents with   Medicare Wellness     Subjective:   John Wolf is a 69 y.o. male who presents for a Medicare Annual Wellness Visit.  Visit info / Clinical Intake: Medicare Wellness Visit Type:: Initial Annual Wellness Visit Persons participating in visit and providing information:: patient Medicare Wellness Visit Mode:: Telephone If telephone:: video declined Since this visit was completed virtually, some vitals may be partially provided or unavailable. Missing vitals are due to the limitations of the virtual format.: Unable to obtain vitals - no equipment If Telephone or Video please confirm:: I connected with patient using audio/video enable telemedicine. I verified patient identity with two identifiers, discussed telehealth limitations, and patient agreed to proceed. Patient Location:: home Provider Location:: home Interpreter Needed?: No Pre-visit prep was completed: yes AWV questionnaire completed by patient prior to visit?: yes Date:: 05/01/24 Living arrangements:: lives with spouse/significant other Patient's Overall Health Status Rating: very good Typical amount of pain: some Does pain affect daily life?: no Are you currently prescribed opioids?: no  Dietary Habits and Nutritional Risks How many meals a day?: 3 Eats fruit and vegetables daily?: yes Most meals are obtained by: preparing own meals In the last 2 weeks, have you had any of the following?: none Diabetic:: no  Functional Status Activities of Daily Living (to include ambulation/medication): Independent Ambulation: Independent Medication Administration: Independent Home Management (perform basic housework or laundry): Independent Manage your own finances?: yes Primary transportation is: driving Concerns about vision?: no *vision screening is required for WTM* Concerns about hearing?: no  Fall Screening Falls in the past year?: 1 Number of falls in past year: 0 Was  there an injury with Fall?: 0 Fall Risk Category Calculator: 1 Patient Fall Risk Level: Low Fall Risk  Fall Risk Patient at Risk for Falls Due to: No Fall Risks Fall risk Follow up: Falls evaluation completed  Home and Transportation Safety: All rugs have non-skid backing?: N/A, no rugs All stairs or steps have railings?: yes Grab bars in the bathtub or shower?: (!) no Have non-skid surface in bathtub or shower?: (!) no Good home lighting?: yes Regular seat belt use?: (!) no Hospital stays in the last year:: no  Cognitive Assessment Difficulty concentrating, remembering, or making decisions? : no Will 6CIT or Mini Cog be Completed: no 6CIT or Mini Cog Declined: patient alert, oriented, able to answer questions appropriately and recall recent events  Advance Directives (For Healthcare) Does Patient Have a Medical Advance Directive?: Yes Does patient want to make changes to medical advance directive?: No - Patient declined Type of Advance Directive: Healthcare Power of Attorney Copy of Healthcare Power of Attorney in Chart?: No - copy requested  Reviewed/Updated  Reviewed/Updated: Reviewed All (Medical, Surgical, Family, Medications, Allergies, Care Teams, Patient Goals); Care Teams; Patient Goals; Medical History; Surgical History; Family History; Medications; Allergies    Allergies (verified) Quinolones   Current Medications (verified) Outpatient Encounter Medications as of 05/02/2024  Medication Sig   amiodarone  (PACERONE ) 200 MG tablet TAKE 1 TABLET BY MOUTH DAILY   ELIQUIS  5 MG TABS tablet TAKE 1 TABLET BY MOUTH TWICE DAILY   furosemide  (LASIX ) 40 MG tablet TAKE ONE TABLET BY MOUTH ONCE DAILY   metoprolol  tartrate (LOPRESSOR ) 25 MG tablet TAKE 1 TABLET BY MOUTH TWICE DAILY   pantoprazole  (PROTONIX ) 40 MG tablet Take 1 tablet (40 mg total) by mouth daily.   spironolactone  (ALDACTONE ) 25 MG tablet Take 1 tablet (25 mg total) by mouth daily.  ALPRAZolam  (XANAX ) 0.25 MG  tablet Take 1 tablet (0.25 mg total) by mouth at bedtime as needed for anxiety. Or insomnia (Patient not taking: Reported on 05/02/2024)   atorvastatin  (LIPITOR) 80 MG tablet TAKE 1 TABLET BY MOUTH DAILY (Patient not taking: Reported on 05/02/2024)   Melatonin 1 MG CAPS Take by mouth. (Patient not taking: Reported on 05/02/2024)   ondansetron  (ZOFRAN -ODT) 4 MG disintegrating tablet Take 1 tablet (4 mg total) by mouth every 8 (eight) hours as needed for nausea or vomiting. (Patient not taking: Reported on 05/02/2024)   sulfamethoxazole -trimethoprim  (BACTRIM  DS) 800-160 MG tablet Take 1 tablet by mouth 2 (two) times daily. (Patient not taking: Reported on 05/02/2024)   traZODone  (DESYREL ) 50 MG tablet Take 0.5-1 tablets (25-50 mg total) by mouth at bedtime as needed for sleep. (Patient not taking: Reported on 05/02/2024)   No facility-administered encounter medications on file as of 05/02/2024.    History: Past Medical History:  Diagnosis Date   Acquired pes planus    Arthritis    Benign neoplasm of colon    History of left knee replacement    Hyperlipidemia    Hypertension    Obesity (BMI 30-39.9)    with 70 lb weight loss, ongoing   Onychia and paronychia of toe    Sleep apnea, obstructive 04/26/2000   wears CPAP   Past Surgical History:  Procedure Laterality Date   athroplasty left shoulder     COLONOSCOPY WITH PROPOFOL  N/A 08/27/2021   Procedure: COLONOSCOPY WITH PROPOFOL ;  Surgeon: Jinny Carmine, MD;  Location: [facility];  Service: Endoscopy;  Laterality: N/A;   left shoulder     shoulder dislocation, rotator    POLYPECTOMY N/A 08/27/2021   Procedure: POLYPECTOMY;  Surgeon: Jinny Carmine, MD;  Location: [facility];  Service: Endoscopy;  Laterality: N/A;   Family History  Problem Relation Age of Onset   Heart disease Mother    Stroke Father    Stroke Brother    Hypertension Brother    Cancer Brother 12       AML   Social History   Occupational History   Occupation:  [employer]  Tobacco Use   Smoking status: Never   Smokeless tobacco: Never  Vaping Use   Vaping status: Never Used  Substance and Sexual Activity   Alcohol use: Yes    Comment: occasionally   Drug use: Never   Sexual activity: Not on file   Tobacco Counseling Counseling given: Not Answered  SDOH Screenings   Food Insecurity: No Food Insecurity (11/25/2023)   Received from [facility]  Housing: Low Risk  (11/25/2023)   Received from [facility]  Transportation Needs: No Transportation Needs (11/25/2023)   Received from [facility]  Utilities: Not At Risk (03/15/2023)  Depression (PHQ2-9): Low Risk (05/02/2024)  Financial Resource Strain: Low Risk  (11/25/2023)   Received from [facility]  Physical Activity: Sufficiently Active (05/02/2024)  Social Connections: Moderately Integrated (05/02/2024)  Stress: No Stress Concern Present (05/02/2024)  Tobacco Use: Low Risk (05/02/2024)  Health Literacy: Adequate Health Literacy (05/02/2024)   See flowsheets for full screening details  Depression Screen PHQ 2 & 9 Depression Scale- Over the past 2 weeks, how often have you been bothered by any of the following problems? Little interest or pleasure in doing things: 0 Feeling down, depressed, or hopeless (PHQ Adolescent also includes...irritable): 0 PHQ-2 Total Score: 0 Trouble falling or staying asleep, or sleeping too much:  2 Feeling tired or having little energy: 2 Poor appetite or overeating (PHQ Adolescent also includes...weight loss): 0 Feeling bad about yourself - or that you are a failure or have let yourself or your family down: 0 Trouble concentrating on things, such as reading the newspaper or watching television (PHQ Adolescent also includes...like school work): 0 Moving or speaking so slowly that other people could have noticed. Or the opposite - being so fidgety or restless that you have been moving around a  lot more than usual: 0 Thoughts that you would be better off dead, or of hurting yourself in some way: 0 PHQ-9 Total Score: 4 If you checked off any problems, how difficult have these problems made it for you to do your work, take care of things at home, or get along with other people?: Somewhat difficult  Depression Treatment Depression Interventions/Treatment : Medication; Currently on Treatment     Goals Addressed               This Visit's Progress     Weight Loss Achieved (pt-stated)   On track     Evidence-based guidance:  Review medication that may contribute to weight gain, such as corticosteroid, beta-blocker, tricyclic antidepressant, oral antihyperglycemic; advocate for changes when appropriate.  Perform or refer to registered dietitian to perform comprehensive nutrition assessment that includes disordered-eating behaviors, such as binge-eating, emotional or compulsive eating, grazing.  Counsel patient regarding health risks of obesity and that weight loss goal of 5 to 10 percent of initial weight will improve risk.  Recommend initial weight loss goal of 3 to 5 percent of bodyweight; increase weight-loss goals based on patient success as achieving greater weight loss continues to reduce risk.  Propose a calorie-reduced diet based on the patient's preferences and health status.  Provide ongoing emotional support or cognitive behavioral therapy and dietitian services (individual, group, virtual) over at least 6 months with a minimum of 14 encounters to best facilitate weight loss.  Provide monthly follow-up for 12 months when weight loss goal is met to assist with maintenance of weight loss.  Encourage increased physical activity or exercise based on individual age, risk, and ability up to 200 to 300 minutes per week that includes aerobic and resistance training.  Encourage reduction in sedentary behaviors by replacing them with nonexercise yet active leisure pursuits.  Identify  physical barriers, such as change in posture, balance, gait patterns, joint pain, and environmental barriers to activity.  Consider referral to rehabilitation therapy, especially when mobility or function is impaired due to osteoarthritis and obesity.  Consider referral to weight-loss program that has published evidence of safety and efficacy if on-site intensive intervention is unavailable or patient preference.  Prepare patient for use of pharmacologic therapy as an adjunct to lifestyle changes based on body mass index, patient agreement and presence of risk factors or comorbidities.  Evaluate efficacy of pharmacologic therapy (weight loss) and tolerance to medication periodically.  Engage in shared decision-making regarding referral to bariatric surgeon for consultation and evaluation when weight-loss goal has not been accomplished by behavioral therapy with or without pharmacologic therapy.   Notes:              Objective:    Today's Vitals   05/02/24 1347  Weight: 267 lb (121.1 kg)  Height: 5' 7 (1.702 m)   Body mass index is 41.82 kg/m.  Hearing/Vision screen Vision Screening - Comments:: Has been 2 years Immunizations and Health Maintenance Health Maintenance  Topic Date Due  Pneumococcal Vaccine: 50+ Years (1 of 2 - PCV) Never done   Zoster Vaccines- Shingrix (1 of 2) Never done   Influenza Vaccine  11/25/2023   COVID-19 Vaccine (4 - 2025-26 season) 12/26/2023   DTaP/Tdap/Td (2 - Td or Tdap) 07/15/2024   Medicare Annual Wellness (AWV)  05/02/2025   Colonoscopy  02/23/2030   Hepatitis C Screening  Completed   Meningococcal B Vaccine  Aged Out        Assessment/Plan:  This is a routine wellness examination for Natanel.  Patient Care Team: Marylynn Verneita CROME, MD as PCP - General (Internal Medicine) Tobie Tonita POUR, DO as Consulting Physician (Neurology) Franchot Arthea Rush, MD as Referring Physician (Neurosurgery) Hughes, George C IV, MD as Attending Physician  (Cardiothoracic Surgery)  I have personally reviewed and noted the following in the patients chart:   Medical and social history Use of alcohol, tobacco or illicit drugs  Current medications and supplements including opioid prescriptions. Functional ability and status Nutritional status Physical activity Advanced directives List of other physicians Hospitalizations, surgeries, and ER visits in previous 12 months Vitals Screenings to include cognitive, depression, and falls Referrals and appointments  No orders of the defined types were placed in this encounter.  In addition, I have reviewed and discussed with patient certain preventive protocols, quality metrics, and best practice recommendations. A written personalized care plan for preventive services as well as general preventive health recommendations were provided to patient.   Julian Lemmings, LPN  11/26/7971  Return in about 1 year (around 05/02/2025).  After Visit Summary: (MyChart) Due to this being a telephonic visit, the after visit summary with patients personalized plan was offered to patient via MyChart   Nurse Notes:  Patient doing well "
